# Patient Record
Sex: Female | Born: 1955 | Race: White | Hispanic: No | Marital: Married | Smoking: Former smoker
Health system: Southern US, Community
[De-identification: ages and names within clinical notes are randomized; demographics above are authoritative.]

## PROBLEM LIST (undated history)

## (undated) DIAGNOSIS — M810 Age-related osteoporosis without current pathological fracture: Secondary | ICD-10-CM

## (undated) DIAGNOSIS — H8109 Meniere's disease, unspecified ear: Secondary | ICD-10-CM

## (undated) DIAGNOSIS — E039 Hypothyroidism, unspecified: Secondary | ICD-10-CM

## (undated) DIAGNOSIS — G35 Multiple sclerosis: Secondary | ICD-10-CM

## (undated) DIAGNOSIS — N8501 Benign endometrial hyperplasia: Secondary | ICD-10-CM

## (undated) DIAGNOSIS — R609 Edema, unspecified: Secondary | ICD-10-CM

## (undated) DIAGNOSIS — A0472 Enterocolitis due to Clostridium difficile, not specified as recurrent: Secondary | ICD-10-CM

## (undated) HISTORY — DX: Hypothyroidism, unspecified: E03.9

## (undated) HISTORY — PX: PELVIC LAPAROSCOPY: SHX162

## (undated) HISTORY — DX: Edema, unspecified: R60.9

## (undated) HISTORY — DX: Enterocolitis due to Clostridium difficile, not specified as recurrent: A04.72

## (undated) HISTORY — PX: THYROIDECTOMY, PARTIAL: SHX18

## (undated) HISTORY — DX: Multiple sclerosis: G35

## (undated) HISTORY — DX: Age-related osteoporosis without current pathological fracture: M81.0

## (undated) HISTORY — DX: Meniere's disease, unspecified ear: H81.09

## (undated) HISTORY — PX: DILATION AND CURETTAGE OF UTERUS: SHX78

## (undated) HISTORY — DX: Benign endometrial hyperplasia: N85.01

---

## 1998-10-14 ENCOUNTER — Other Ambulatory Visit: Admission: RE | Admit: 1998-10-14 | Discharge: 1998-10-14 | Payer: Self-pay | Admitting: Obstetrics and Gynecology

## 2000-02-25 ENCOUNTER — Other Ambulatory Visit: Admission: RE | Admit: 2000-02-25 | Discharge: 2000-02-25 | Payer: Self-pay | Admitting: Otolaryngology

## 2000-06-23 ENCOUNTER — Other Ambulatory Visit: Admission: RE | Admit: 2000-06-23 | Discharge: 2000-06-23 | Payer: Self-pay | Admitting: Obstetrics and Gynecology

## 2001-07-13 ENCOUNTER — Other Ambulatory Visit: Admission: RE | Admit: 2001-07-13 | Discharge: 2001-07-13 | Payer: Self-pay | Admitting: Gynecology

## 2003-06-13 ENCOUNTER — Other Ambulatory Visit: Admission: RE | Admit: 2003-06-13 | Discharge: 2003-06-13 | Payer: Self-pay | Admitting: Gynecology

## 2003-09-07 DIAGNOSIS — N8501 Benign endometrial hyperplasia: Secondary | ICD-10-CM

## 2003-09-07 HISTORY — PX: HYSTEROSCOPY: SHX211

## 2003-09-07 HISTORY — DX: Benign endometrial hyperplasia: N85.01

## 2003-09-12 ENCOUNTER — Ambulatory Visit (HOSPITAL_BASED_OUTPATIENT_CLINIC_OR_DEPARTMENT_OTHER): Admission: RE | Admit: 2003-09-12 | Discharge: 2003-09-12 | Payer: Self-pay | Admitting: Gynecology

## 2003-09-12 ENCOUNTER — Ambulatory Visit (HOSPITAL_COMMUNITY): Admission: RE | Admit: 2003-09-12 | Discharge: 2003-09-12 | Payer: Self-pay | Admitting: Gynecology

## 2003-09-12 ENCOUNTER — Encounter (INDEPENDENT_AMBULATORY_CARE_PROVIDER_SITE_OTHER): Payer: Self-pay | Admitting: Specialist

## 2004-06-18 ENCOUNTER — Other Ambulatory Visit: Admission: RE | Admit: 2004-06-18 | Discharge: 2004-06-18 | Payer: Self-pay | Admitting: Gynecology

## 2004-12-03 ENCOUNTER — Ambulatory Visit: Payer: Self-pay | Admitting: Internal Medicine

## 2004-12-08 ENCOUNTER — Ambulatory Visit: Payer: Self-pay | Admitting: Internal Medicine

## 2004-12-17 ENCOUNTER — Ambulatory Visit: Payer: Self-pay

## 2006-01-12 ENCOUNTER — Other Ambulatory Visit: Admission: RE | Admit: 2006-01-12 | Discharge: 2006-01-12 | Payer: Self-pay | Admitting: Gynecology

## 2006-02-18 ENCOUNTER — Encounter: Admission: RE | Admit: 2006-02-18 | Discharge: 2006-02-18 | Payer: Self-pay | Admitting: Endocrinology

## 2006-03-02 ENCOUNTER — Encounter: Admission: RE | Admit: 2006-03-02 | Discharge: 2006-03-02 | Payer: Self-pay | Admitting: Endocrinology

## 2006-03-02 ENCOUNTER — Encounter (INDEPENDENT_AMBULATORY_CARE_PROVIDER_SITE_OTHER): Payer: Self-pay | Admitting: *Deleted

## 2006-03-02 ENCOUNTER — Other Ambulatory Visit: Admission: RE | Admit: 2006-03-02 | Discharge: 2006-03-02 | Payer: Self-pay | Admitting: Interventional Radiology

## 2006-03-26 ENCOUNTER — Emergency Department (HOSPITAL_COMMUNITY): Admission: EM | Admit: 2006-03-26 | Discharge: 2006-03-26 | Payer: Self-pay | Admitting: Emergency Medicine

## 2006-06-02 ENCOUNTER — Encounter (INDEPENDENT_AMBULATORY_CARE_PROVIDER_SITE_OTHER): Payer: Self-pay | Admitting: Specialist

## 2006-06-02 ENCOUNTER — Ambulatory Visit (HOSPITAL_COMMUNITY): Admission: RE | Admit: 2006-06-02 | Discharge: 2006-06-03 | Payer: Self-pay | Admitting: Surgery

## 2006-08-10 ENCOUNTER — Ambulatory Visit (HOSPITAL_COMMUNITY): Admission: RE | Admit: 2006-08-10 | Discharge: 2006-08-10 | Payer: Self-pay | Admitting: Gastroenterology

## 2006-08-10 ENCOUNTER — Encounter (INDEPENDENT_AMBULATORY_CARE_PROVIDER_SITE_OTHER): Payer: Self-pay | Admitting: Specialist

## 2007-02-14 ENCOUNTER — Other Ambulatory Visit: Admission: RE | Admit: 2007-02-14 | Discharge: 2007-02-14 | Payer: Self-pay | Admitting: Gynecology

## 2007-09-18 ENCOUNTER — Encounter: Admission: RE | Admit: 2007-09-18 | Discharge: 2007-09-18 | Payer: Self-pay | Admitting: Radiology

## 2008-02-15 ENCOUNTER — Other Ambulatory Visit: Admission: RE | Admit: 2008-02-15 | Discharge: 2008-02-15 | Payer: Self-pay | Admitting: Gynecology

## 2008-02-23 ENCOUNTER — Encounter: Admission: RE | Admit: 2008-02-23 | Discharge: 2008-02-23 | Payer: Self-pay | Admitting: Gynecology

## 2008-03-23 ENCOUNTER — Inpatient Hospital Stay (HOSPITAL_COMMUNITY): Admission: EM | Admit: 2008-03-23 | Discharge: 2008-03-26 | Payer: Self-pay | Admitting: Emergency Medicine

## 2008-05-09 ENCOUNTER — Ambulatory Visit: Payer: Self-pay | Admitting: Internal Medicine

## 2008-05-09 DIAGNOSIS — E559 Vitamin D deficiency, unspecified: Secondary | ICD-10-CM | POA: Insufficient documentation

## 2008-05-09 DIAGNOSIS — E785 Hyperlipidemia, unspecified: Secondary | ICD-10-CM | POA: Diagnosis present

## 2008-05-09 DIAGNOSIS — Z860100 Personal history of colon polyps, unspecified: Secondary | ICD-10-CM | POA: Insufficient documentation

## 2008-05-09 DIAGNOSIS — F411 Generalized anxiety disorder: Secondary | ICD-10-CM | POA: Insufficient documentation

## 2008-05-09 DIAGNOSIS — E039 Hypothyroidism, unspecified: Secondary | ICD-10-CM | POA: Insufficient documentation

## 2008-05-09 DIAGNOSIS — D509 Iron deficiency anemia, unspecified: Secondary | ICD-10-CM | POA: Insufficient documentation

## 2008-05-10 ENCOUNTER — Ambulatory Visit: Payer: Self-pay | Admitting: Internal Medicine

## 2008-05-14 LAB — CONVERTED CEMR LAB
Alkaline Phosphatase: 40 units/L (ref 39–117)
BUN: 12 mg/dL (ref 6–23)
Basophils Absolute: 0 10*3/uL (ref 0.0–0.1)
Bilirubin Urine: NEGATIVE
Bilirubin, Direct: 0.1 mg/dL (ref 0.0–0.3)
CO2: 31 meq/L (ref 19–32)
Cholesterol: 177 mg/dL (ref 0–200)
Creatinine, Ser: 0.8 mg/dL (ref 0.4–1.2)
Folate: 12.8 ng/mL
Glucose, Bld: 109 mg/dL — ABNORMAL HIGH (ref 70–99)
HDL: 50 mg/dL (ref >39.0)
Hemoglobin, Urine: NEGATIVE
Hemoglobin: 14.3 g/dL (ref 12.0–15.0)
Iron: 91 ug/dL (ref 42–145)
LDL Cholesterol: 108 mg/dL — ABNORMAL HIGH (ref 0–99)
Lymphocytes Relative: 37.5 % (ref 12.0–46.0)
MCHC: 34.4 g/dL (ref 30.0–36.0)
Monocytes Relative: 12.6 % — ABNORMAL HIGH (ref 3.0–12.0)
Platelets: 189 10*3/uL (ref 150–400)
Potassium: 3.9 meq/L (ref 3.5–5.1)
Total Bilirubin: 0.8 mg/dL (ref 0.3–1.2)
Total Protein, Urine: NEGATIVE mg/dL
Total Protein: 6.8 g/dL (ref 6.0–8.3)
Transferrin: 234.2 mg/dL (ref >=212.0)
Triglycerides: 94 mg/dL (ref 0–149)
Urobilinogen, UA: 0.2 (ref 0.0–1.0)
VLDL: 19 mg/dL (ref 0–40)
Vitamin B-12: 387 pg/mL (ref 211–911)
pH: 5 (ref 5.0–8.0)

## 2008-06-07 ENCOUNTER — Telehealth (INDEPENDENT_AMBULATORY_CARE_PROVIDER_SITE_OTHER): Payer: Self-pay | Admitting: *Deleted

## 2008-06-07 DIAGNOSIS — N3000 Acute cystitis without hematuria: Secondary | ICD-10-CM | POA: Insufficient documentation

## 2008-06-10 ENCOUNTER — Ambulatory Visit: Payer: Self-pay | Admitting: Internal Medicine

## 2008-06-26 ENCOUNTER — Ambulatory Visit (HOSPITAL_BASED_OUTPATIENT_CLINIC_OR_DEPARTMENT_OTHER): Admission: RE | Admit: 2008-06-26 | Discharge: 2008-06-26 | Payer: Self-pay | Admitting: Orthopedic Surgery

## 2008-08-06 ENCOUNTER — Encounter: Payer: Self-pay | Admitting: Internal Medicine

## 2009-02-17 ENCOUNTER — Other Ambulatory Visit: Admission: RE | Admit: 2009-02-17 | Discharge: 2009-02-17 | Payer: Self-pay | Admitting: Gynecology

## 2009-02-17 ENCOUNTER — Encounter: Payer: Self-pay | Admitting: Gynecology

## 2009-02-17 ENCOUNTER — Ambulatory Visit: Payer: Self-pay | Admitting: Gynecology

## 2009-02-24 ENCOUNTER — Encounter: Admission: RE | Admit: 2009-02-24 | Discharge: 2009-02-24 | Payer: Self-pay | Admitting: Gynecology

## 2009-07-02 ENCOUNTER — Ambulatory Visit (HOSPITAL_BASED_OUTPATIENT_CLINIC_OR_DEPARTMENT_OTHER): Admission: RE | Admit: 2009-07-02 | Discharge: 2009-07-02 | Payer: Self-pay | Admitting: Orthopedic Surgery

## 2009-07-18 ENCOUNTER — Ambulatory Visit: Payer: Self-pay | Admitting: Internal Medicine

## 2009-07-18 LAB — CONVERTED CEMR LAB
AST: 57 units/L — ABNORMAL HIGH (ref 0–37)
Albumin: 3.9 g/dL (ref 3.5–5.2)
Alkaline Phosphatase: 55 units/L (ref 39–117)
BUN: 13 mg/dL (ref 6–23)
Bilirubin Urine: NEGATIVE
CO2: 30 meq/L (ref 19–32)
Chloride: 104 meq/L (ref 96–112)
GFR calc non Af Amer: 79.28 mL/min (ref >60)
Glucose, Bld: 100 mg/dL — ABNORMAL HIGH (ref 70–99)
HCT: 38.4 % (ref 36.0–46.0)
HDL: 64.5 mg/dL (ref >39.00)
Ketones, ur: NEGATIVE mg/dL
Leukocytes, UA: NEGATIVE
Lymphocytes Relative: 31.5 % (ref 12.0–46.0)
MCV: 91.4 fL (ref 78.0–100.0)
Neutro Abs: 2.4 10*3/uL (ref 1.4–7.7)
Platelets: 197 10*3/uL (ref 150.0–400.0)
Potassium: 4.1 meq/L (ref 3.5–5.1)
RBC: 4.2 M/uL (ref 3.87–5.11)
Specific Gravity, Urine: 1.02 (ref 1.000–1.030)
TSH: 2.97 microintl units/mL (ref 0.35–5.50)
Total Protein, Urine: NEGATIVE mg/dL
VLDL: 20.6 mg/dL (ref 0.0–40.0)
WBC: 4.4 10*3/uL — ABNORMAL LOW (ref 4.5–10.5)

## 2009-07-22 ENCOUNTER — Ambulatory Visit: Payer: Self-pay | Admitting: Internal Medicine

## 2009-09-06 HISTORY — PX: KNEE SURGERY: SHX244

## 2010-01-12 ENCOUNTER — Ambulatory Visit: Payer: Self-pay | Admitting: Internal Medicine

## 2010-01-12 DIAGNOSIS — T887XXA Unspecified adverse effect of drug or medicament, initial encounter: Secondary | ICD-10-CM | POA: Insufficient documentation

## 2010-01-12 DIAGNOSIS — J309 Allergic rhinitis, unspecified: Secondary | ICD-10-CM | POA: Insufficient documentation

## 2010-01-12 DIAGNOSIS — J45909 Unspecified asthma, uncomplicated: Secondary | ICD-10-CM | POA: Insufficient documentation

## 2010-01-13 LAB — CONVERTED CEMR LAB
AST: 29 units/L (ref 0–37)
BUN: 14 mg/dL (ref 6–23)
Basophils Absolute: 0 10*3/uL (ref 0.0–0.1)
Basophils Relative: 0.4 % (ref 0.0–3.0)
Bilirubin, Direct: 0.1 mg/dL (ref 0.0–0.3)
Chloride: 101 meq/L (ref 96–112)
Direct LDL: 117.5 mg/dL
Eosinophils Absolute: 0.1 10*3/uL (ref 0.0–0.7)
HDL: 72.6 mg/dL (ref >39.00)
Lymphs Abs: 1.4 10*3/uL (ref 0.7–4.0)
MCHC: 34.2 g/dL (ref 30.0–36.0)
Monocytes Absolute: 0.6 10*3/uL (ref 0.1–1.0)
Monocytes Relative: 12.2 % — ABNORMAL HIGH (ref 3.0–12.0)
Neutro Abs: 2.4 10*3/uL (ref 1.4–7.7)
Neutrophils Relative %: 53.2 % (ref 43.0–77.0)
Platelets: 216 10*3/uL (ref 150.0–400.0)
Potassium: 4.4 meq/L (ref 3.5–5.1)
Total CHOL/HDL Ratio: 3
Triglycerides: 145 mg/dL (ref 0.0–149.0)
WBC: 4.5 10*3/uL (ref 4.5–10.5)

## 2010-01-30 ENCOUNTER — Ambulatory Visit: Payer: Self-pay | Admitting: Gynecology

## 2010-02-18 ENCOUNTER — Ambulatory Visit: Payer: Self-pay | Admitting: Gynecology

## 2010-02-18 ENCOUNTER — Other Ambulatory Visit: Admission: RE | Admit: 2010-02-18 | Discharge: 2010-02-18 | Payer: Self-pay | Admitting: Gynecology

## 2010-02-25 ENCOUNTER — Encounter: Admission: RE | Admit: 2010-02-25 | Discharge: 2010-02-25 | Payer: Self-pay | Admitting: Gynecology

## 2010-03-02 ENCOUNTER — Ambulatory Visit: Payer: Self-pay | Admitting: Gynecology

## 2010-03-05 ENCOUNTER — Ambulatory Visit: Payer: Self-pay | Admitting: Gynecology

## 2010-04-27 ENCOUNTER — Ambulatory Visit: Payer: Self-pay | Admitting: Gynecology

## 2010-08-13 ENCOUNTER — Encounter: Payer: Self-pay | Admitting: Internal Medicine

## 2010-10-04 LAB — CONVERTED CEMR LAB: Pap Smear: NORMAL

## 2010-10-06 NOTE — Assessment & Plan Note (Signed)
Summary: LINGERING COUGH,COLD/CD   Vital Signs:  Patient profile:   55 year old female Height:      65.5 inches Weight:      125.50 pounds BMI:     20.64 O2 Sat:      98 % on Room air Temp:     98.2 degrees F oral Pulse rate:   79 / minute BP sitting:   110 / 70  (left arm) Cuff size:   regular  Vitals Entered ByZella Ball Ewing (Jan 12, 2010 2:08 PM)  O2 Flow:  Room air  CC: Cough, congestion/RE   CC:  Cough and congestion/RE.  History of Present Illness: here with 2 wks flare of sinus and nasal allergy type congestion with mild ST without pain, fever or feeling ill;  has some mild general weakness but no malaise;  no high fever or chills;  Pt denies CP, orthopnea, pnd, worsening LE edema, palps, dizziness or syncope , but has sob/doe/wheezing for last 2  wks obviously worse with going outside and opening the house windows;  has had marked coughing nonprod that wakes her up at night;  last nighy broke down and tried her cats albuterol inhaler that allowed her to sleep for 3 hrs straight wihtout awakening to the cough.  also requests labs done and forwarded to her other MD's related to interferon;  also mentions has really been trying to follow lower chol diet.  Denies specific hyper or hypothyroid symptoms  Preventive Screening-Counseling & Management      Drug Use:  no.    Problems Prior to Update: 1)  Adverse Drug Reaction  (ICD-995.20) 2)  Hepatotoxicity, Drug-induced, Risk of  (ICD-V58.69) 3)  Asthma  (ICD-493.90) 4)  Allergic Rhinitis  (ICD-477.9) 5)  Acute Cystitis  (ICD-595.0) 6)  Anxiety  (ICD-300.00) 7)  Anemia-iron Deficiency  (ICD-280.9) 8)  Vitamin D Deficiency  (ICD-268.9) 9)  Colonic Polyps, Hx of  (ICD-V12.72) 10)  Hypothyroidism  (ICD-244.9) 11)  Preventive Health Care  (ICD-V70.0) 12)  Hyperlipidemia  (ICD-272.4)  Medications Prior to Update: 1)  Azithromycin 250 Mg Tabs (Azithromycin) .... 2po Qd For 1 Day, Then 1po Qd For 4days, Then Stop 2)  Inferon  Beta 1a 3)  Synthroid 100 Mcg Tabs (Levothyroxine Sodium) .Marland Kitchen.. 1 By Mouth Once Daily 4)  Methocarbamol 500 Mg Tabs (Methocarbamol) .... Use Asd As Needed 5)  Oxycodone-Acetaminophen 5-325 Mg Tabs (Oxycodone-Acetaminophen) .... Use Asd As Needed Pain  Current Medications (verified): 1)  Inferon Beta 1a 2)  Synthroid 100 Mcg Tabs (Levothyroxine Sodium) .Marland Kitchen.. 1 By Mouth Once Daily 3)  Proair Hfa 108 (90 Base) Mcg/act Aers (Albuterol Sulfate) .... 2 Puffs Qid As Needed 4)  Symbicort 160-4.5 Mcg/act Aero (Budesonide-Formoterol Fumarate) .... 2 Puffs Twice Per Day 5)  Singulair 10 Mg Tabs (Montelukast Sodium) .Marland Kitchen.. 1po Once Daily 6)  Fexofenadine Hcl 180 Mg Tabs (Fexofenadine Hcl) .Marland Kitchen.. 1po Once Daily Once Daily (Generic For Allegra)  Allergies (verified): No Known Drug Allergies  Past History:  Past Surgical History: Last updated: 07/22/2009 s/p partial thyroidectomy s/p right breast biopsy - neg s/p laparoscopy for infertility s/p left knee arthroscopy  oct 2010 s/po right knee arthroscopy  oct 2009  Social History: Last updated: 01/12/2010 work - Education administrator Married/previously divorced no children Former Smoker Alcohol use-yes Drug use-no  Risk Factors: Smoking Status: quit (05/09/2008)  Past Medical History: Hyperlipidemia Hypothyroidism Colonic polyps, hx of Multilple sclerosis low vit d Anemia-iron deficiency hx of hyperthyroid nodule s/p patrial thyroidectomy hx of symptomatic  PVC's Anxiety ? hx of ETOH abuse Allergic rhinitis Asthma  Social History: Reviewed history from 05/09/2008 and no changes required. 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   Head:  normocephalic and atraumatic.   Eyes:  vision grossly intact, pupils equal, and pupils round.   Ears:  bilat tm's mild  erythema, sinus nontender Nose:  nasal dischargemucosal pallor and mucosal edema.   Mouth:  pharyngeal erythema and fair dentition.   Neck:  supple and no masses.   Lungs:  normal respiratory effort, R decreased breath sounds, and L decreased breath sounds.  with trace wheezing Heart:  normal rate and regular rhythm.   Abdomen:  soft, non-tender, and normal bowel sounds.   Extremities:  no edema, no erythema  Skin:  color normal and no rashes.     Impression & Recommendations:  Problem # 1:  ALLERGIC RHINITIS (ICD-477.9)  Her updated medication list for this problem includes:    Fexofenadine Hcl 180 Mg Tabs (Fexofenadine hcl) .Marland Kitchen... 1po once daily once daily (generic for allegra)  Orders: Depo- Medrol 40mg  (J1030) Depo- Medrol 80mg  (J1040) Admin of Therapeutic Inj  intramuscular or subcutaneous (16109) for depo shot, and treat as above, f/u any worsening signs or symptoms   Problem # 2:  ASTHMA (ICD-493.90)  Her updated medication list for this problem includes:    Proair Hfa 108 (90 Base) Mcg/act Aers (Albuterol sulfate) .Marland Kitchen... 2 puffs qid as needed    Symbicort 160-4.5 Mcg/act Aero (Budesonide-formoterol fumarate) .Marland Kitchen... 2 puffs twice per day    Singulair 10 Mg Tabs (Montelukast sodium) .Marland Kitchen... 1po once daily treat as above, f/u any worsening signs or symptoms , d/w pt pathyphysiology and goals of tx  Problem # 3:  ANEMIA-IRON DEFICIENCY (ICD-280.9) for f/u cbc  Problem # 4:  VITAMIN D DEFICIENCY (ICD-268.9) declines f/u vit d - to cont vit d supp (take calcium/vit d supp)  Problem # 5:  HYPERLIPIDEMIA (ICD-272.4)  Orders: TLB-Lipid Panel (80061-LIPID)  Labs Reviewed: SGOT: 57 (07/18/2009)   SGPT: 57 (07/18/2009)   HDL:64.50 (07/18/2009), 50.0 (05/10/2008)  LDL:108 (05/10/2008)  Chol:251 (07/18/2009), 177 (05/10/2008)  Trig:103.0 (07/18/2009), 94 (05/10/2008) d/w pt - Pt to continue diet efforts, ; to check labs - goal LDL less than 100  Problem # 6:  HYPOTHYROIDISM  (ICD-244.9)  Her updated medication list for this problem includes:    Synthroid 100 Mcg Tabs (Levothyroxine sodium) .Marland Kitchen... 1 by mouth once daily  Orders: TLB-TSH (Thyroid Stimulating Hormone) (84443-TSH)  Labs Reviewed: TSH: 2.97 (07/18/2009)    Chol: 251 (07/18/2009)   HDL: 64.50 (07/18/2009)   LDL: 108 (05/10/2008)   TG: 103.0 (07/18/2009) stable overall by hx and exam, ok to continue meds/tx as is   Complete Medication List: 1)  Inferon Beta 1a  2)  Synthroid 100 Mcg Tabs (Levothyroxine sodium) .Marland Kitchen.. 1 by mouth once daily 3)  Proair Hfa 108 (90 Base) Mcg/act Aers (Albuterol sulfate) .... 2 puffs qid as needed 4)  Symbicort 160-4.5 Mcg/act Aero (Budesonide-formoterol fumarate) .... 2 puffs twice per day 5)  Singulair 10 Mg Tabs (Montelukast sodium) .Marland Kitchen.. 1po once daily 6)  Fexofenadine Hcl 180 Mg Tabs (Fexofenadine hcl) .Marland Kitchen.. 1po once daily once daily (generic for allegra)  Other Orders: TLB-Hepatic/Liver Function Pnl (80076-HEPATIC) TLB-BMP (Basic Metabolic Panel-BMET) (80048-METABOL) TLB-CBC Platelet -  w/Differential (85025-CBCD)  Patient Instructions: 1)  you had the steroid shot today 2)  Please take all new medications as prescribed  - the proair hfa at 2 puffs 4 times per day as needed only, symbicort (2 puffs twice per day), and singulair 10 mg per day (the proair and symbicort samples are given today) 3)  you can take the allegra 180 mg generic if you are not able to get the singulair 4)  Remember to rinse your mouth with water after each symbicort to avoid thrush 5)  Please go to the Lab in the basement for your blood and/or urine tests today 6)  we will fax the results later to your other doctors 7)  Please schedule a follow-up appointment in Nov 2011 with [facility] labs 8)  Remember, you may be able to stop or hold on taking the symbicort on or about july 1 if the asthma is seasonal for the spring and fall Prescriptions: FEXOFENADINE HCL 180 MG TABS (FEXOFENADINE HCL) 1po once  daily once daily (generic for allegra)  #30 x 11   Entered and Authorized by:   Corwin Levins MD   Signed by:   Corwin Levins MD on 01/12/2010   Method used:   Print then Give to Patient   RxID:   [id number] SINGULAIR 10 MG TABS (MONTELUKAST SODIUM) 1po once daily  #30 x 11   Entered and Authorized by:   Corwin Levins MD   Signed by:   Corwin Levins MD on 01/12/2010   Method used:   Print then Give to Patient   RxID:   [id number] SYMBICORT 160-4.5 MCG/ACT AERO (BUDESONIDE-FORMOTEROL FUMARATE) 2 puffs twice per day  #1 x 11   Entered and Authorized by:   Corwin Levins MD   Signed by:   Corwin Levins MD on 01/12/2010   Method used:   Print then Give to Patient   RxID:   [id number] PROAIR HFA 108 (90 BASE) MCG/ACT AERS (ALBUTEROL SULFATE) 2 puffs qid as needed  #1 x 11   Entered and Authorized by:   Corwin Levins MD   Signed by:   Corwin Levins MD on 01/12/2010   Method used:   Print then Give to Patient   RxID:   [id number]    Medication Administration  Injection # 1:    Medication: Depo- Medrol 40mg     Diagnosis: ALLERGIC RHINITIS (ICD-477.9)    Route: IM    Site: RUOQ gluteus    Exp Date: 07/2012    Lot #: [id number]    Mfr: Pharmacia    Given by: Zella Ball Ewing (Jan 12, 2010 3:34 PM)  Injection # 2:    Medication: Depo- Medrol 80mg     Diagnosis: ALLERGIC RHINITIS (ICD-477.9)    Route: IM    Site: RUOQ gluteus    Exp Date: 07/2012    Lot #: [id number]    Mfr: [employer]    Given by: Zella Ball Ewing (Jan 12, 2010 3:34 PM)  Orders Added: 1)  TLB-Lipid Panel [80061-LIPID] 2)  TLB-TSH (Thyroid Stimulating Hormone) [84443-TSH] 3)  TLB-Hepatic/Liver Function Pnl [80076-HEPATIC] 4)  TLB-BMP (Basic Metabolic Panel-BMET) [80048-METABOL] 5)  TLB-CBC Platelet - w/Differential [85025-CBCD] 6)  Depo- Medrol 40mg  [J1030] 7)  Depo- Medrol 80mg  [J1040] 8)  Admin of Therapeutic Inj  intramuscular or subcutaneous [96372] 9)  Est. Patient Level IV [22025]

## 2010-12-10 LAB — POCT HEMOGLOBIN-HEMACUE: Hemoglobin: 13.3 g/dL (ref 12.0–15.0)

## 2011-01-13 ENCOUNTER — Other Ambulatory Visit: Payer: Self-pay | Admitting: Dermatology

## 2011-01-19 NOTE — H&P (Signed)
NAMESHAQUALA, Ana Russell                 ACCOUNT NO.:  [id number]   MEDICAL RECORD NO.:  [id number]          PATIENT TYPE:  EMS   LOCATION:  ED                           FACILITY:  [facility]   PHYSICIAN:  Levert Feinstein, MD          DATE OF BIRTH:  1955-10-06   DATE OF ADMISSION:  03/23/2008  DATE OF DISCHARGE:                              HISTORY & PHYSICAL   CHIEF COMPLAINT:  Multiple sclerosis.   HISTORY OF PRESENT ILLNESS:  The patient is a 55 year old right-handed  Caucasian female who is accompanied by her husband at today's ER visit.  She is a right-handed Education administrator.  She reported a diagnosis of multiple  sclerosis 10 years ago, presenting with diplopia at that time.  Workup  was done at [facility].  Reported had an MRI of the brain  and also cervical, which has confirmed the diagnosis but no treatment  was offered.   Her diplopia lasted about 3 months initially.  Later improved with  prescription glasses but she is no longer wearing them in the past 10  years.  Never seeking further neurological care in the past 10 years.  There was were no recurrent neurological attacks, but she did report  that her bilateral feet stay numb.  She has difficulty initiating urine  but at baseline denied gait difficulty, dysarthria, dysphagia, enjoying  painting at home.   Sunday, 6 days prior to admission, she had slight slurred speech.  On  Monday, the next day, while driving, she noticed double vision.  She  took out her old pair of glasses and put a patch on it for her to drive.  However, her dysarthria, diplopia has worsened over the past few days'  course.  In addition, she has gradually developed unsteady gait and her  husband also reported she seems to be confused.   REVIEW OF SYSTEMS:  She complains of knee pain.  There was no chest  pain.  No headaches.  There was no dysuria.   PAST MEDICAL HISTORY:  Also has restless leg syndrome, hypothyroidism  following thyroidectomy a year and  a half ago.   PAST SURGICAL HISTORY:  Thyroidectomy.  Pathology reported to be  consistent with thyroiditis.   FAMILY HISTORY:  Sister suffers a thyroid tumor.  Mother also has  hypothyroidism.  Knee problems run in the family.   SOCIAL HISTORY:  Moved to [place] about 10 years ago.  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  She smokes 5-6 cigarettes every day and drinks a couple  of wines with dinner every day.   CURRENT MEDICATIONS:  Synthroid 75 mcg every day.   DRUG ALLERGIES:  No known drug allergies.   PHYSICAL EXAMINATION:  She is afebrile.  CARDIAC:  Regular rate and rhythm.  PULMONARY:  Clear to auscultation bilaterally.  NECK:  Supple.  No carotid bruits.  NEUROLOGICAL EXAMINATION:  She is alert and oriented to history taking  and conversation.  There was profound dysarthria.  Cranial nerves 2-12:  Pupils equal, round, reactive to light, and visual fields were full on  confrontation.  Fundi were sharp bilaterally.  There was end-gaze mild  horizontal nystagmus, bilateral exotropia.  Facial sensation and  strength were normal.  Uvula and tongue midline.  Has slow, ataxic  tongue movements.  Head turning, shoulder shrugging were normal and  symmetric.  MOTOR EXAMINATION:  Normal pulling back and strength.  Sensory was  intact to light touch, pinprick, vibratory sensation.  Deep tendon  reflexes were diffusely hyperreflexic, 3 out of 4, symmetric.  Jaw jerk  was present but not hyper.  Plantar responses were extensor bilaterally.  Coordination:  She has mild bilateral finger-to-nose dysmetria,  worsening on the right side, also with alternating rapid movement.  She  has moderate left lower extremity heel to shin dysmetria.  Gait:  She  walked with a wide-based, unsteady gait.   MRI of the brain with and without contrast revealed there are multiple  periventricular white matter disease, oval shaped.  Perpendicular  to the  ventricle there was also T1 holes.  There were large sites of right  midbrain positive DWI lesions.  Motion degraded the imaging.  I do not  see any contrast enhancement.   MRA of the brain and neck was normal.   LAB EVALUATION:  UA demonstrated a few bacteria with WBC.   ASSESSMENT AND PLAN:  A 55 year old female with at least a 10-year  history of multiple sclerosis, history mostly suggestive of relapsing  remitting course.  Discussed with the patient and her husband, planning  on the following:  1. Steroids and Solu-Medrol 500 mg IV b.i.d. to hasten the recovery.  2. Cipro 250 b.i.d. for urinary tract infection.  3. She will definitely benefit from long-term interferon treatment.      Options provided.  We decided on Rebif.  I checked with the      pharmacy.  No Rebif is in stock, so we will initiate the medication      as an outpatient.  She will receive subcutaneous injection training      here.  4. Complete workup including MRI of the cervical spine and thorax with      and without contrast.  5. Physical therapy and occupational therapy and speech therapy.      Levert Feinstein, MD  Electronically Signed     YY/MEDQ  D:  03/23/2008  T:  03/23/2008  Job:  [id number]

## 2011-01-19 NOTE — Op Note (Signed)
NAMECHARNISE, Ana Russell                 ACCOUNT NO.:  [id number]   MEDICAL RECORD NO.:  [id number]          PATIENT TYPE:  AMB   LOCATION:  [facility]                         FACILITY:  [facility]   PHYSICIAN:  Ollen Gross, M.D.    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   PROCEDURE:  Right knee arthroscopy with chondroplasty patella and  lateral retinacular release.   SURGEON:  Ollen Gross, M.D.   ASSISTANT:  No assistant.   ANESTHESIA:  General.   ESTIMATED BLOOD LOSS:  Minimal.   DRAINS:  Hemovac times one.   COMPLICATIONS:  None.   CONDITION:  Stable to recovery. Marland Kitchen   CLINICAL NOTE:  Ana Russell is a 54 year old female with longstanding history  of problems related to her right knee.  She has a family history of  maltracking patella and had a sister with the same problem who underwent  lateral retinacular release.  Ana Russell has had a significant anterior knee  pain refractory to nonoperative measures.  She has lateral tilt and exam  and history consistent with chondromalacia patella.  She presents now  for arthroscopy with chondroplasty and lateral retinacular release.   PROCEDURE IN DETAIL:  After successful administration of general  anesthetic, a tourniquet was placed high on the right thigh and right  lower extremity prepped and draped in the usual sterile fashion.  Standard superomedial and inferolateral incisions were made, inflow  cannula passed superomedial, camera passed inferolateral.  Arthroscopic  visualization proceeds.  The majority of the undersurface of patella  looks normal except the lateral facet, which has grade II and III  chondromalacia.  There was no exposed bone.  The lateral trochlea  surprisingly did not show much  chondromalacia at all.  She has very mild  changes laterally but no unstable appearing cartilage.  Medial and  lateral gutters were visualized and there were no loose bodies.  Flexion  and valgus force applied to the knee and the medial compartment is  entered.  The medial compartment looks normal.  A spinal needle was used  to localize the inferomedial portal, small incision made, dilator  placed.  The intercondylar notch was visualized.  The ACL appears  normal.  Lateral compartment was entered and it is normal.  I then  addressed the patellofemoral compartment.  We used a 4.2 mm shaver to  smooth the roughened surface of the lateral facet the cartilage.  Cartilage was unstable and was debrided back to a stable cartilaginous  base with the shaver.  We then turned off the inflow and switched  portals with the working portal inferolateral and camera inferomedial  port.  I completed the chondroplasty through the inferolateral portal.  Once the cartilage was smoothed off then we marked the junction of the  superior and lateral parts of the patella with a spinal needle and that  was a starting  point for the lateral retinacular release.  The  arthroscopic cautery is placed in the inferolateral portal and released  start at this point and coursing all the way down to the portal.  Once  the release was completed, then I decreased the inflow pressure and  cauterized small bleeding points.  I felt we had excellent hemostasis.  The arthroscopic equipment was then removed from the inferior portals  which were closed with interrupted 4-0 nylon.  20 mL of 0.25% Marcaine  with epi were injected through the inflow cannula and the Hemovac drain  was threaded through the cannula and cannula subsequently removed.  The  portal site was closed but the drain is not sewn in.  Drain is hooked to  suction a bulky sterile dressing is applied and she is awakened and  transferred to recovery in stable  condition.      Ollen Gross, M.D.  Electronically Signed     FA/MEDQ  D:  06/26/2008  T:  06/26/2008  Job:  [id number]

## 2011-01-19 NOTE — Discharge Summary (Signed)
NAMEPANHIA, KARL                 ACCOUNT NO.:  [id number]   MEDICAL RECORD NO.:  [id number]          PATIENT TYPE:  INP   LOCATION:  1523                         FACILITY:  [facility]   PHYSICIAN:  Levert Feinstein, MD          DATE OF BIRTH:  06/04/1956   DATE OF ADMISSION:  03/23/2008  DATE OF DISCHARGE:  03/25/2008                               DISCHARGE SUMMARY   ADMITTING DIAGNOSIS:  Multiple sclerosis.   DISCHARGE DIAGNOSIS:  Multiple sclerosis.   HOSPITAL STAY:  The patient is a 55 year old right-handed Caucasian  female who was admitted secondary to having multiple sclerosis and  having difficulty with walking, balance, and dysarthria.  The patient  initially had diplopia that lasted 3 months, but later improved with  prescription glasses.  However, Sunday, 6 days prior to admission, she  had slight slurring of speech.  Next day while driving, she noticed  blurred and double vision.  She took out her old pair of glasses, put on  a patch to allow her to drive.  However, her dysarthria and diplopia has  worsened over the past few days.  While in the hospital, the patient  received 500 mg methylprednisolone IV b.i.d., which improved her  symptoms of diplopia.  However, her dysarthria remains unchanged.   PHYSICAL EXAM:  At the time of discharge, the patient was alert and  oriented, conversant.  CRANIAL NERVES 2-12:  Pupils equal, round, and reactive to light.  Visual fields were full to confrontation.  Fundi were sharp bilaterally.  Facial sensation was within normal limits.  Uvula and tongue midline.  She did still have slow ataxic tongue movements.  Head turning and  shoulder shrug were all normal.  Motor examination normal.  Strength 5/5  in all extremities.  Deep tendon reflexes were hyperreflexic 3/4 and  symmetrical.  Plantar responses still up bilaterally.  Coordination had  improved.  Her finger to nose only had slight dysmetria on her right  side, and at the very end of the  motion.  No lower extremity dysmetria  with heel to shin.   MRI at discharge had not changed.  MRI with and without contrast  revealed that there are multiple periventricular white matter disease,  oval in shape, perpendicular to the ventricular.  There also were T1  holes.  There were large sites of right midbrain positive DWI lesions.  MRA of the brain and neck were all normal.  Hospital labs were all  within normal limits.   COURSE:  As stated, the patient was admitted and received 500 mg of  methylprednisolone b.i.d., and she will continue this as an outpatient  procedure.   DISPOSITION:  The patient will be discharged home with home therapy for  PT/OT and speech therapy.  She will also be discharged home with a  walker for fall precautions, and for home health to follow up with the  patient.   MEDICATIONS ON DISCHARGE:  1. The patient was discharged home on methylprednisolone 500 mg IV      b.i.d.  for total of 5 days.  Clinical coordinator will be working      with her on this.  2. She was also discharged with a prescription for Cipro 250 mg to be      taken b.i.d. for a urinary tract infection.  She will be given one      week worth of medication.  3. She was also given a prescription for Protonix 40 mg to be taken      b.i.d  4. In addition, she was given a prescription for physical therapy,      occupational therapy, and speech therapy to work with her on a      daily basis.   FAMILY HISTORY:  The patient will be following up with Dr. Terrace Arabia within 1  to 2 weeks.  At that time, will most likely start rebif therapy.      Levert Feinstein, MD  Electronically Signed     YY/MEDQ  D:  03/25/2008  T:  03/25/2008  Job:  [id number]

## 2011-01-22 NOTE — H&P (Signed)
NAME:  Ana Russell, Ana Russell                         ACCOUNT NO.:  [id number]   MEDICAL RECORD NO.:  [id number]                   PATIENT TYPE:  AMB   LOCATION:  [facility]                                 FACILITY:  [facility]   PHYSICIAN:  Timothy P. Fontaine, M.D.           DATE OF BIRTH:  1955-11-02   DATE OF ADMISSION:  09/11/2002  DATE OF DISCHARGE:                                HISTORY & PHYSICAL   CHIEF COMPLAINT:  Menorrhagia.   HISTORY OF PRESENT ILLNESS:  A 55 year old G37, P0, AB2 female history of  regular menses who notes over the past year her periods have gotten heavier  and heavier.  She underwent an outpatient evaluation to include ultrasound  which showed multiple myomas and had a questionable endometrial polyp on  routine ultrasound.  She had a follow-up sonohysterogram which confirmed  intracavitary abnormalities consistent with either polyps or small myomas  and she is admitted at this time for hysteroscopic evaluation.   PAST MEDICAL HISTORY:  None.   PAST SURGICAL HISTORY:  TAB x2.   ALLERGIES:  None.   REVIEW OF SYSTEMS:  Noncontributory.   CURRENT MEDICATIONS:  None.   FAMILY HISTORY:  Noncontributory.   PHYSICAL EXAMINATION:  VITAL SIGNS:  Afebrile.  Vital signs are stable.  HEENT:  Normal.  LUNGS:  Clear.  CARDIAC:  Regular rate without murmurs, rubs, or gallops.  ABDOMEN:  Benign.  PELVIC:  External BUS, vagina normal.  Uterus:  Mild irregularity.  Adnexa  without masses or tenderness.   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  I reviewed with the patient as far as her history  of menorrhagia and her myomas that there are no guarantees as far as that  hysteroscopy and removal of the intracavitary abnormalities will relieve her  menorrhagia.  She also clearly understands that we are not addressing the  myomas that are in the wall  of the uterus, but only the pedunculated or  significantly intracavitary abnormalities.  I discussed what is involved  with hysteroscopic myomectomy to include instrumentation, use of the  hysteroscope and the risks to include bleeding, transfusion, infection,  uterine perforation, damage to internal organs including bowel, bladder,  ureters, vessels, and nerves necessitating major exploratory reparative  surgeries and future reparative  surgeries including ostomy formation.  The patient's questions are answered  to her satisfaction and she is ready to proceed with surgery.  She has a CBC  and a serum pregnancy test ordered as a preoperative, the results of which  are pending at this time.                                               Timothy P. Fontaine, M.D.  TPF/MEDQ  D:  09/09/2003  T:  09/09/2003  Job:  [id number]

## 2011-01-22 NOTE — Op Note (Signed)
Ana Russell, Ana Russell                 ACCOUNT NO.:  [id number]   MEDICAL RECORD NO.:  [id number]          PATIENT TYPE:  OIB   LOCATION:  1607                         FACILITY:  [facility]   PHYSICIAN:  Velora Heckler, MD      DATE OF BIRTH:  1956-07-10   DATE OF PROCEDURE:  06/02/2006  DATE OF DISCHARGE:  06/03/2006                                 OPERATIVE REPORT   PREOPERATIVE DIAGNOSIS:  Left thyroid nodule with cytologic atypia.   POSTOPERATIVE DIAGNOSIS:  Left thyroid nodule with cytologic atypia.   PROCEDURE:  Left thyroid lobectomy.   SURGEON:  Velora Heckler, MD, FACS   ASSISTANT:  Harriette Bouillon, MD, FACS   ANESTHESIA:  General.   ESTIMATED BLOOD LOSS:  Minimal.   PREPARATION:  Betadine.   COMPLICATIONS:  None.   INDICATIONS:  The patient is a 55 year old white female from [place],  [place].  The patient had been found on routine laboratory studies to  have hypothyroidism.  She was referred to Dr. Dorisann Frames.  Workup  included thyroid ultrasound, which showed a 2.8-cm nodule in the left  thyroid lobe.  Fine-needle aspiration was performed and showed cytologic  atypia with intranuclear grooves and scant colloid.  The patient was  referred for excision for definitive diagnosis.  The patient now comes to  the operating room for a left thyroid lobectomy.   BODY OF REPORT:  Procedure is done in OR #6 at the [facility].  The patient is brought to the operating room and placed in supine  position on the operating room table.  Following the administration of  general anesthesia, the patient is positioned and then prepped and draped in  the usual strict aseptic fashion.  After ascertaining that an adequate level  of anesthesia had been obtained, a Kocher incision was made a #15 blade.  Dissection was carried through subcutaneous tissues and platysma.  Skin  flaps are developed cephalad and caudad from the thyroid notch to the  sternal notch.  A  Mahorner self-retaining retractor is placed for exposure.  Strap muscles are incised in the midline and the dissection is begun on the  left side of the neck.  Strap muscles are reflected laterally and the left  thyroid lobe is exposed.  Venous tributaries are divided between small  Ligaclips.  Superior pole is dissected out, and vessels are ligated in  continuity with 2-0 silk ties and medium Ligaclips and divided.  Gland is  rolled anteriorly.  Parathyroid tissue is identified on the thyroid capsule.  It is gently dissected out and preserved.  Gland is rolled anteriorly.  Branches of the inferior thyroid artery are divided between small Ligaclips.  The recurrent nerve is identified and preserved.  Ligament of Allyson Sabal is  transected with the electrocautery and the gland is rolled anteriorly up and  onto the anterior surface of the trachea.  The isthmus is mobilized across  the midline and then divided between hemostats and suture-ligated with 3-0  Vicryl suture ligatures.  Specimen is submitted to Pathology, where Dr. Jimmy Picket  did frozen section analysis.  This shows a follicular lesion.  There  is a background of thyroiditis.  There appears to be no evidence of  malignancy on frozen section evaluation.   Right thyroid lobe is firm, prominent, but without dominant nodule.  Likely,  this represents thyroiditis.   The left neck is irrigated with warm saline, which is evacuated.  Surgicel  was placed over the area of the recurrent nerve and parathyroid glands.  Strap muscles are reapproximated in the midline with interrupted 3-0 Vicryl  sutures.  Platysma is closed with interrupted 3-0 Vicryl sutures.  Skin is  closed with a running 4-0 Vicryl subcuticular suture.  Wound is washed and  dried and Benzoin and Steri-Strips are applied.  Sterile dressings are  applied.  The patient is awakened from anesthesia and brought to the  recovery room in stable condition.  The patient tolerated the  procedure  well.      Velora Heckler, MD  Electronically Signed     TMG/MEDQ  D:  06/02/2006  T:  06/04/2006  Job:  [id number]   cc:   Dorisann Frames, M.D.  Fax: [phone]   Timothy P. Fontaine, M.D.  Fax: [phone]   Corwin Levins, MD  [address]  [place]  [place] [address]

## 2011-01-22 NOTE — Op Note (Signed)
Ana Russell, Ana Russell                 ACCOUNT NO.:  [id number]   MEDICAL RECORD NO.:  [id number]          PATIENT TYPE:  AMB   LOCATION:  ENDO                         FACILITY:  [facility]   PHYSICIAN:  Anselmo Rod, M.D.  DATE OF BIRTH:  08-10-1956   DATE OF PROCEDURE:  08/10/2006  DATE OF DISCHARGE:                               OPERATIVE REPORT   PROCEDURE:  Colonoscopy with multiple cold biopsies.   ENDOSCOPIST:  Anselmo Rod, M.D.   INSTRUMENT USED:  Olympus video colonoscope.   INDICATIONS FOR PROCEDURE:  55 year old white female undergoing  screening colonoscopy to rule out colonic polyps, masses, etc.   PREPROCEDURE PREPARATION:  Informed consent was obtained from the  patient.  The patient was fasted for eight hours prior to the procedure  and prepped with a gallon of TriLyte the night prior to the procedure.  The risks and benefits of the procedure including a 10% miss rate of  cancer and polyps were discussed with the patient, as well.   PREPROCEDURE PHYSICAL:  Patient with stable vital signs.  Neck supple.  Chest clear to auscultation.  S1 and S2 regular.  Abdomen soft with  normal bowel sounds.   DESCRIPTION OF PROCEDURE:  The patient was placed in the left lateral  decubitus position, sedated with 140 mcg of fentanyl and 14 mg Versed  given intravenously in slow incremental doses.  Once the patient was  adequately sedated, maintained on low flow oxygen and continuous cardiac  monitoring, the Olympus video colonoscope was advanced from the rectum  to the cecum.  The patient had a very tortuous colon.  The patient's  position had to be changed from the left lateral to the supine and the  right lateral position and gently back to the left lateral and prone  position to reach the cecal base.  On multiple occasions, gentle  abdominal pressure was applied.  The appendiceal orifice and ileocecal  valve were clearly visualized and photographed.  Four small sessile  polyps  were biopsied from the rectosigmoid colon.  Small internal  hemorrhoids were seen on retroflexion.  The rest of the exam was  unremarkable.   IMPRESSION:  1. Very tortuous colon.  2. Four small sessile polyps biopsied from the rectosigmoid colon.  3. Small internal hemorrhoids.  4. Otherwise, normal exam beyond the rectosigmoid colon up to the      cecum.   RECOMMENDATIONS:  1. Await pathology results.  2. Avoid all non-steroidals including aspirin for the next two weeks.  3. Repeat colonoscopy depending on pathology results.  4. Outpatient follow up as need arises in the future.      Anselmo Rod, M.D.  Electronically Signed     JNM/MEDQ  D:  08/10/2006  T:  08/11/2006  Job:  [id number]   cc:   Marcial Pacas P. Fontaine, M.D.  Corwin Levins, MD  Velora Heckler, MD

## 2011-01-22 NOTE — Op Note (Signed)
NAME:  VIRGEN, BELLAND                         ACCOUNT NO.:  [id number]   MEDICAL RECORD NO.:  [id number]                   PATIENT TYPE:  AMB   LOCATION:  [facility]                                 FACILITY:  [facility]   PHYSICIAN:  Timothy P. Fontaine, M.D.           DATE OF BIRTH:  1956/03/01   DATE OF PROCEDURE:  09/12/2003  DATE OF DISCHARGE:                                 OPERATIVE REPORT   PREOPERATIVE DIAGNOSES:  1. Endometrial polyps.  2. Leiomyomata.   POSTOPERATIVE DIAGNOSES:  1. Endometrial polyps.  2. Leiomyomata.   OPERATION/PROCEDURE:  1. Hysteroscopy.  2. Dilatation and curettage.   SURGEON:  Timothy P. Fontaine, M.D.   ANESTHESIA:  General.   ESTIMATED BLOOD LOSS:  Minimal.   COMPLICATIONS:  None.   FLUIDS:  Sorbitol discrepancy less than 50 mL.   FINDINGS:  Multiple small polypoid undulations, posterior right uterine  cavity.  No definitive isolated polyps.  The remainder of the cavity smooth  and normal.  Fundus, anterior and posterior uterine surfaces, lower uterine  segment, endocervical canal, and right and left tubal ostia were all  visualized.   DESCRIPTION OF PROCEDURE:  The patient was taken to the operating room,  underwent general anesthesia, was placed in the low dorsal lithotomy  position, received a perineal vaginal preparation with Betadine solution,  bladder emptied with in-and-out Foley catheterization, EUA performed, and  the patient was then draped in the usual fashion.  The cervix was visualized  with the speculum, anterior lip grasped with a single-tooth tenaculum and  the cervix was gently gradually dilated to admit the operative hysteroscope.  Hysteroscopy was then performed with findings noted above.   Initial curettage was performed directing the curet to the posterior uterine  surface and this was sent as initial specimen.  Subsequent 360-degree cavity  curettage was then performed and this was sent separately.  Rehysteroscopy  was  performed.  The cavity was noted to be empty.  Good distention.  No  evidence of perforation.  The instruments were all removed.  There was  adequate hemostasis visualized.  The patient was placed in the supine  position, awakened without difficulty and taken to the recovery room in good  condition, having tolerated the procedure well.  Sorbitol discrepancy per  machine was 60 mL although sorbitol was noted within the tubing I estimated  less than 50 mL discrepancy.                                               Timothy P. Audie Box, M.D.    TPF/MEDQ  D:  09/12/2003  T:  09/12/2003  Job:  [id number]

## 2011-01-26 ENCOUNTER — Encounter: Payer: Self-pay | Admitting: Gynecology

## 2011-02-12 ENCOUNTER — Other Ambulatory Visit: Payer: Self-pay | Admitting: Gynecology

## 2011-02-12 DIAGNOSIS — Z1231 Encounter for screening mammogram for malignant neoplasm of breast: Secondary | ICD-10-CM

## 2011-02-17 ENCOUNTER — Telehealth: Payer: Self-pay | Admitting: *Deleted

## 2011-02-17 NOTE — Telephone Encounter (Signed)
noted 

## 2011-02-17 NOTE — Telephone Encounter (Signed)
Pt called to make MD aware that she has an appointment with GYN next Monday and that labs would be drawn at that appt. She also has upcoming appt next Thursday with Dr. Jonny Ruiz and will have labs faxed over from GYN's office instead of coming again for labs next Tuesday.

## 2011-02-22 ENCOUNTER — Other Ambulatory Visit: Payer: Self-pay | Admitting: Gynecology

## 2011-02-22 ENCOUNTER — Encounter (INDEPENDENT_AMBULATORY_CARE_PROVIDER_SITE_OTHER): Payer: 59 | Admitting: Gynecology

## 2011-02-22 ENCOUNTER — Other Ambulatory Visit (HOSPITAL_COMMUNITY)
Admission: RE | Admit: 2011-02-22 | Discharge: 2011-02-22 | Disposition: A | Discharge status: Home / Self Care | Payer: 59 | Source: Ambulatory Visit | Attending: Gynecology | Admitting: Gynecology

## 2011-02-22 DIAGNOSIS — M899 Disorder of bone, unspecified: Secondary | ICD-10-CM

## 2011-02-22 DIAGNOSIS — R809 Proteinuria, unspecified: Secondary | ICD-10-CM

## 2011-02-22 DIAGNOSIS — Z01419 Encounter for gynecological examination (general) (routine) without abnormal findings: Secondary | ICD-10-CM

## 2011-02-22 DIAGNOSIS — Z124 Encounter for screening for malignant neoplasm of cervix: Secondary | ICD-10-CM | POA: Insufficient documentation

## 2011-02-22 DIAGNOSIS — M949 Disorder of cartilage, unspecified: Secondary | ICD-10-CM

## 2011-02-23 ENCOUNTER — Other Ambulatory Visit: Payer: Self-pay | Admitting: Internal Medicine

## 2011-02-23 ENCOUNTER — Other Ambulatory Visit: Payer: Self-pay

## 2011-02-23 DIAGNOSIS — Z0000 Encounter for general adult medical examination without abnormal findings: Secondary | ICD-10-CM

## 2011-02-24 ENCOUNTER — Encounter: Payer: Self-pay | Admitting: Internal Medicine

## 2011-02-24 DIAGNOSIS — Z0000 Encounter for general adult medical examination without abnormal findings: Secondary | ICD-10-CM | POA: Insufficient documentation

## 2011-02-25 ENCOUNTER — Other Ambulatory Visit: Payer: Self-pay | Admitting: Internal Medicine

## 2011-02-25 ENCOUNTER — Encounter: Payer: Self-pay | Admitting: Internal Medicine

## 2011-02-25 ENCOUNTER — Ambulatory Visit (INDEPENDENT_AMBULATORY_CARE_PROVIDER_SITE_OTHER): Payer: 59 | Admitting: Internal Medicine

## 2011-02-25 ENCOUNTER — Other Ambulatory Visit (INDEPENDENT_AMBULATORY_CARE_PROVIDER_SITE_OTHER): Payer: 59

## 2011-02-25 VITALS — BP 110/80 | HR 75 | Temp 97.8°F | Ht 65.0 in | Wt 126.5 lb

## 2011-02-25 DIAGNOSIS — Z0000 Encounter for general adult medical examination without abnormal findings: Secondary | ICD-10-CM

## 2011-02-25 DIAGNOSIS — G35 Multiple sclerosis: Secondary | ICD-10-CM

## 2011-02-25 HISTORY — DX: Multiple sclerosis: G35

## 2011-02-25 LAB — TSH: TSH: 0.65 u[IU]/mL (ref 0.35–5.50)

## 2011-02-25 LAB — URINALYSIS, ROUTINE W REFLEX MICROSCOPIC
Bilirubin Urine: NEGATIVE
Hgb urine dipstick: NEGATIVE
Ketones, ur: NEGATIVE
Leukocytes, UA: NEGATIVE
Nitrite: NEGATIVE
Total Protein, Urine: NEGATIVE

## 2011-02-25 LAB — CBC WITH DIFFERENTIAL/PLATELET
Eosinophils Absolute: 0.1 10*3/uL (ref 0.0–0.7)
Lymphs Abs: 0.9 10*3/uL (ref 0.7–4.0)
MCHC: 34.5 g/dL (ref 30.0–36.0)
MCV: 90.3 fl (ref 78.0–100.0)
Monocytes Absolute: 0.6 10*3/uL (ref 0.1–1.0)
Neutrophils Relative %: 52.1 % (ref 43.0–77.0)
Platelets: 160 10*3/uL (ref 150.0–400.0)

## 2011-02-25 LAB — BASIC METABOLIC PANEL
Chloride: 106 mEq/L (ref 96–112)
Potassium: 4.4 mEq/L (ref 3.5–5.1)
Sodium: 140 mEq/L (ref 135–145)

## 2011-02-25 LAB — HEPATIC FUNCTION PANEL
ALT: 26 U/L (ref 0–35)
AST: 29 U/L (ref 0–37)
Alkaline Phosphatase: 56 U/L (ref 39–117)
Bilirubin, Direct: 0.1 mg/dL (ref 0.0–0.3)
Total Bilirubin: 0.4 mg/dL (ref 0.3–1.2)

## 2011-02-25 LAB — LDL CHOLESTEROL, DIRECT: Direct LDL: 121.7 mg/dL

## 2011-02-25 LAB — LIPID PANEL
Total CHOL/HDL Ratio: 3
VLDL: 25 mg/dL (ref 0.0–40.0)

## 2011-02-25 NOTE — Patient Instructions (Signed)
Continue all other medications as before Please go to LAB in the Basement for the blood and/or urine tests to be done today Please call the phone number [phone] (the PhoneTree System) for results of testing in 2-3 days;  When calling, simply dial the number, and when prompted enter the MRN number above (the Medical Record Number) and the # key, then the message should start. We will fax results to Dr Talmage Nap, Dr Terrace Arabia, Dr Audie Box Please return in 1 year for your yearly visit, or sooner if needed, with Lab testing done 3-5 days before

## 2011-02-26 ENCOUNTER — Encounter: Payer: Self-pay | Admitting: Internal Medicine

## 2011-02-26 ENCOUNTER — Other Ambulatory Visit: Payer: Self-pay

## 2011-02-26 MED ORDER — ATORVASTATIN CALCIUM 10 MG PO TABS: 10.0000 mg | ORAL_TABLET | Freq: Every day | ORAL | Status: DC

## 2011-02-26 NOTE — Progress Notes (Signed)
Subjective:    Patient ID: Ana Russell, female    DOB: July 19, 1956, 55 y.o.   MRN: [id number]  HPI Here for wellness and f/u;  Overall doing ok;  Pt denies CP, worsening SOB, DOE, wheezing, orthopnea, PND, worsening LE edema, palpitations, dizziness or syncope.  Pt denies neurological change such as new Headache, facial or extremity weakness.  Pt denies polydipsia, polyuria, or low sugar symptoms. Pt states overall good compliance with treatment and medications, good tolerability, and trying to follow lower cholesterol diet.  Pt denies worsening depressive symptoms, suicidal ideation or panic. No fever, wt loss, night sweats, loss of appetite, or other constitutional symptoms.  Pt states good ability with ADL's, low fall risk, home safety reviewed and adequate, no significant changes in hearing or vision, and occasionally active with exercise. Past Medical History  Diagnosis Date  . Vaginal infection     RECURRENT VAGINAL INFECTIONS  . Endometrial hyperplasia without atypia, simple 09/2003    NORMAL ENDOMETRIAL BIOPSY 03/2005  . Hypothyroidism   . Osteopenia 09/2007    2-3 1/09 VIT D 23  . Multiple sclerosis   . MS (multiple sclerosis) 02/25/2011   Past Surgical History  Procedure Date  . Dilation and curettage of uterus     TAB  . Hysteroscopy 09/2003    POLYPS  . Thyroidectomy, partial   . Knee surgery 2011    bilateral knee scopes    reports that she has quit smoking. She does not have any smokeless tobacco history on file. Her alcohol and drug histories not on file. family history includes Diabetes in her mother and paternal grandfather. No Known Allergies Current Outpatient Prescriptions on File Prior to Visit  Medication Sig Dispense Refill  . interferon beta-1a (REBIF) 44 MCG/0.5ML injection Inject 44 mcg into the skin.        . Levothyroxine Sodium (SYNTHROID PO) Take by mouth.         Review of Systems Review of Systems  Constitutional: Negative for diaphoresis, activity  change, appetite change and unexpected weight change.  HENT: Negative for hearing loss, ear pain, facial swelling, mouth sores and neck stiffness.   Eyes: Negative for pain, redness and visual disturbance.  Respiratory: Negative for shortness of breath and wheezing.   Cardiovascular: Negative for chest pain and palpitations.  Gastrointestinal: Negative for diarrhea, blood in stool, abdominal distention and rectal pain.  Genitourinary: Negative for hematuria, flank pain and decreased urine volume.  Musculoskeletal: Negative for myalgias and joint swelling.  Skin: Negative for color change and wound.  Neurological: Negative for syncope and numbness.  Hematological: Negative for adenopathy.  Psychiatric/Behavioral: Negative for hallucinations, self-injury, decreased concentration and agitation.      Objective:   Physical Exam BP 110/80  Pulse 75  Temp(Src) 97.8 F (36.6 C) (Oral)  Ht 5\' 5"  (1.651 m)  Wt 126 lb 8 oz (57.38 kg)  BMI 21.05 kg/m2  SpO2 97% Physical Exam  VS noted, thin, somewhat frail Constitutional: Pt is oriented to person, place, and time. Appears well-developed and well-nourished.  HENT:  Head: Normocephalic and atraumatic.  Right Ear: External ear normal.  Left Ear: External ear normal.  Nose: Nose normal.  Mouth/Throat: Oropharynx is clear and moist.  Eyes: Conjunctivae and EOM are normal. Pupils are equal, round, and reactive to light.  Neck: Normal range of motion. Neck supple. No JVD present. No tracheal deviation present.  Cardiovascular: Normal rate, regular rhythm, normal heart sounds and intact distal pulses.   Pulmonary/Chest: Effort normal and  breath sounds normal.  Abdominal: Soft. Bowel sounds are normal. There is no tenderness.  Musculoskeletal: Normal range of motion. Exhibits no edema.  Lymphadenopathy:  Has no cervical adenopathy.  Neurological: Pt is alert and oriented to person, place, and time. Pt has normal reflexes. No cranial nerve deficit.    Skin: Skin is warm and dry. No rash noted.  Psychiatric:  Has  normal mood and affect. Behavior is normal. 1+ nervous        Assessment & Plan:

## 2011-02-26 NOTE — Assessment & Plan Note (Signed)

## 2011-03-01 ENCOUNTER — Ambulatory Visit
Admission: RE | Admit: 2011-03-01 | Discharge: 2011-03-01 | Disposition: A | Discharge status: Home / Self Care | Payer: 59 | Source: Ambulatory Visit | Attending: Gynecology | Admitting: Gynecology

## 2011-03-01 ENCOUNTER — Telehealth: Payer: Self-pay

## 2011-03-01 DIAGNOSIS — Z1231 Encounter for screening mammogram for malignant neoplasm of breast: Secondary | ICD-10-CM

## 2011-03-01 NOTE — Telephone Encounter (Signed)
Message copied by Anselm Jungling on Mon Mar 01, 2011  8:21 AM ------      Message from: Corwin Levins      Created: Fri Feb 26, 2011  9:08 PM      Regarding: labs fax       Please fax lab results to Dr Lisabeth Devoid at [facility] medical, Dr Terrace Arabia - neuro, and Dr Audie Box - GYN

## 2011-03-01 NOTE — Telephone Encounter (Signed)
Labs faxed as requested to Dr Terrace Arabia [phone], Dr Lurene Shadow [phone] and Dr Audie Box [phone]

## 2011-03-05 ENCOUNTER — Telehealth: Payer: Self-pay

## 2011-03-05 NOTE — Telephone Encounter (Signed)
Ok to have labs drawn at [facility] lab at [facility]

## 2011-03-05 NOTE — Telephone Encounter (Signed)
Pt called stating she would prefer to have her follow up labs drawn at another facility. Pt says she was severely bruised the last time she had labs drawn at [facility]. Please advise of alternate lab.

## 2011-03-08 ENCOUNTER — Other Ambulatory Visit: Payer: Self-pay

## 2011-03-08 NOTE — Telephone Encounter (Signed)
A user error has taken place: encounter opened in error, closed for administrative reasons.

## 2011-03-09 NOTE — Telephone Encounter (Signed)
Pt advised.

## 2011-03-30 ENCOUNTER — Other Ambulatory Visit: Payer: 59

## 2011-04-07 ENCOUNTER — Telehealth: Payer: Self-pay | Admitting: *Deleted

## 2011-04-07 DIAGNOSIS — N390 Urinary tract infection, site not specified: Secondary | ICD-10-CM

## 2011-04-07 MED ORDER — SULFAMETHOXAZOLE-TRIMETHOPRIM 800-160 MG PO TABS: 1.0000 | ORAL_TABLET | Freq: Two times a day (BID) | ORAL | Status: AC

## 2011-04-07 NOTE — Telephone Encounter (Signed)
PT C/O UTI S/S FREQUENT URINATION, BURNING WITH URINATION, X2 WEEKS. PT STATES SHE TRIED CRANBERRY JUICE TO HELP, BUT NO RELIEF. PT WOULD LIKE RX. (LAST OV 02/22/11) PLEASE ADVISE.

## 2011-04-07 NOTE — Telephone Encounter (Signed)
Septra DS 1 by mouth twice a day x3 days 

## 2011-04-07 NOTE — Telephone Encounter (Signed)
PT INFORMED TO TAKE THE BELOW RX.

## 2011-04-23 ENCOUNTER — Telehealth: Payer: Self-pay | Admitting: *Deleted

## 2011-04-23 DIAGNOSIS — N898 Other specified noninflammatory disorders of vagina: Secondary | ICD-10-CM

## 2011-04-23 MED ORDER — FLUCONAZOLE 150 MG PO TABS: 150.0000 mg | ORAL_TABLET | Freq: Once | ORAL | Status: AC

## 2011-04-23 NOTE — Telephone Encounter (Signed)
We'll cover for yeast with Diflucan 150x1 dose. If symptoms persist issue then she will need office visit.

## 2011-04-23 NOTE — Telephone Encounter (Signed)
PT C/O DISCHARGE NOW. SHE HAS COMPLETED THE RX SEPTRA ON OFFICE VISIT 04/07/11, SHE HAS NO DISCOMFORT ONLY YELLOWISH DISCHARGE. PLEASE ADVISE.

## 2011-04-23 NOTE — Telephone Encounter (Signed)
PT INFORMED WITH THE BELOW NOTE AND TO FOLLOW UP IF SYMPTOMS PERSIST WITH OFFICE VISIT.

## 2011-04-28 ENCOUNTER — Encounter: Payer: Self-pay | Admitting: Gynecology

## 2011-04-28 ENCOUNTER — Ambulatory Visit (INDEPENDENT_AMBULATORY_CARE_PROVIDER_SITE_OTHER): Payer: 59 | Admitting: Gynecology

## 2011-04-28 DIAGNOSIS — N898 Other specified noninflammatory disorders of vagina: Secondary | ICD-10-CM

## 2011-04-28 DIAGNOSIS — B373 Candidiasis of vulva and vagina: Secondary | ICD-10-CM

## 2011-04-28 MED ORDER — FLUCONAZOLE 200 MG PO TABS: 200.0000 mg | ORAL_TABLET | Freq: Every day | ORAL | Status: AC

## 2011-04-28 NOTE — Progress Notes (Signed)
Patient presents complaining of persistent vaginal itching irritation. She recently called with UTI symptoms was treated with Septra DS 1 by mouth twice a day x3 days. She developed some vaginal itching we called in a Diflucan 150 mg and her itching and discharge has persisted.   Exam Pelvic: External BUS vagina with moderate atrophic changes white discharge noted KOH wet prep done bimanual without masses or tenderness  Assessment and plan: KOH wet prep is positive for yeast we'll treat with Diflucan 200 daily x3 days. Patient will call followup if symptoms persist or recur.

## 2011-05-31 ENCOUNTER — Telehealth: Payer: Self-pay | Admitting: *Deleted

## 2011-05-31 DIAGNOSIS — B373 Candidiasis of vulva and vagina: Secondary | ICD-10-CM

## 2011-05-31 MED ORDER — TERCONAZOLE 0.8 % VA CREA: 1.0000 | TOPICAL_CREAM | Freq: Every day | VAGINAL | Status: AC

## 2011-05-31 NOTE — Telephone Encounter (Signed)
Pt called saying yeast still there, itching started again x1 day with light white discharge. Pt took all the diflucan as directed on office visit 04/28/11. She will [place] leaving tomorrow [place]. She tried over the counter monistat,no relief. Pt would like more diflucan to help with this. Please advise

## 2011-05-31 NOTE — Telephone Encounter (Signed)
Terazol 3 day.  I know it is messier but will get yeast species that Diflucan does not get

## 2011-05-31 NOTE — Telephone Encounter (Signed)
Lm on pt voice mail with the below note.

## 2011-06-04 LAB — COMPREHENSIVE METABOLIC PANEL
ALT: 17
ALT: 21
AST: 19
Albumin: 3.5
Alkaline Phosphatase: 42
BUN: 8
CO2: 31
Calcium: 8.9
Chloride: 103
Creatinine, Ser: 0.89
GFR calc Af Amer: >60
GFR calc non Af Amer: >60
Glucose, Bld: 104 — ABNORMAL HIGH
Potassium: 4.6
Sodium: 139
Sodium: 139
Total Bilirubin: 1.2
Total Protein: 5.9 — ABNORMAL LOW
Total Protein: 6.7

## 2011-06-04 LAB — DIFFERENTIAL
Basophils Absolute: 0
Basophils Relative: 0
Eosinophils Absolute: 0.2
Monocytes Absolute: 0.5
Monocytes Relative: 11
Neutro Abs: 2.8
Neutrophils Relative %: 55

## 2011-06-04 LAB — GLUCOSE, CAPILLARY
Glucose-Capillary: 116 — ABNORMAL HIGH
Glucose-Capillary: 143 — ABNORMAL HIGH
Glucose-Capillary: 144 — ABNORMAL HIGH
Glucose-Capillary: 156 — ABNORMAL HIGH
Glucose-Capillary: 169 — ABNORMAL HIGH
Glucose-Capillary: 204 — ABNORMAL HIGH

## 2011-06-04 LAB — URINALYSIS, ROUTINE W REFLEX MICROSCOPIC
Bilirubin Urine: NEGATIVE
Hgb urine dipstick: NEGATIVE
Ketones, ur: NEGATIVE
Nitrite: NEGATIVE
Specific Gravity, Urine: 1.006
pH: 6

## 2011-06-04 LAB — URINE CULTURE: Colony Count: 3000

## 2011-06-04 LAB — CBC
Hemoglobin: 14.4
MCHC: 34
MCV: 91.9
Platelets: 188
RBC: 4.6
RDW: 13.1
RDW: 13.4
WBC: 5.1

## 2011-06-04 LAB — PROTIME-INR: INR: 0.9

## 2011-06-04 LAB — URINE MICROSCOPIC-ADD ON

## 2011-06-04 LAB — APTT: aPTT: 27

## 2011-06-07 LAB — BASIC METABOLIC PANEL
CO2: 28
Calcium: 9
Creatinine, Ser: 0.69
GFR calc Af Amer: >60
GFR calc non Af Amer: >60
Glucose, Bld: 104 — ABNORMAL HIGH

## 2011-06-07 LAB — CBC
MCHC: 33.9
RBC: 4.14
RDW: 12.3

## 2011-06-07 LAB — PROTIME-INR
INR: 1
Prothrombin Time: 12.8

## 2011-08-12 ENCOUNTER — Ambulatory Visit (INDEPENDENT_AMBULATORY_CARE_PROVIDER_SITE_OTHER): Payer: 59 | Admitting: Gynecology

## 2011-08-12 ENCOUNTER — Encounter: Payer: Self-pay | Admitting: Gynecology

## 2011-08-12 DIAGNOSIS — B373 Candidiasis of vulva and vagina: Secondary | ICD-10-CM

## 2011-08-12 DIAGNOSIS — N898 Other specified noninflammatory disorders of vagina: Secondary | ICD-10-CM

## 2011-08-12 MED ORDER — FLUCONAZOLE 200 MG PO TABS: ORAL_TABLET | ORAL | Status: DC

## 2011-08-12 NOTE — Progress Notes (Signed)
Patient presents complaining of vaginal discharge with itching of the last week or so. She does have a history of recurrent yeast infections in the past. She had used boric acid. Most recently she was treated in August with Diflucan.150 times one, then subsequently Diflucan 200x3 days and ultimately with Terazol 3 day cream. She has remained without symptoms until most recently.  Exam with chaperone present External BUS vagina with thick white discharge. Bimanual without masses or tenderness.  Assessment and plan: Wet prep is positive for yeast we'll treat with Diflucan 200 mg daily x5 days and then weekly x5 weeks total of 10 pills. Follow up if symptoms persist or recur.

## 2011-08-12 NOTE — Patient Instructions (Signed)
Take medication as we discussed

## 2011-09-07 DIAGNOSIS — A0472 Enterocolitis due to Clostridium difficile, not specified as recurrent: Secondary | ICD-10-CM

## 2011-09-07 HISTORY — DX: Enterocolitis due to Clostridium difficile, not specified as recurrent: A04.72

## 2011-09-22 ENCOUNTER — Encounter: Payer: Self-pay | Admitting: Gynecology

## 2012-02-23 ENCOUNTER — Encounter: Payer: Self-pay | Admitting: Gynecology

## 2012-02-23 ENCOUNTER — Ambulatory Visit (INDEPENDENT_AMBULATORY_CARE_PROVIDER_SITE_OTHER): Payer: 59 | Admitting: Gynecology

## 2012-02-23 VITALS — BP 114/70 | Ht 65.25 in | Wt 116.0 lb

## 2012-02-23 DIAGNOSIS — Z01419 Encounter for gynecological examination (general) (routine) without abnormal findings: Secondary | ICD-10-CM

## 2012-02-23 DIAGNOSIS — Z13220 Encounter for screening for lipoid disorders: Secondary | ICD-10-CM

## 2012-02-23 DIAGNOSIS — M949 Disorder of cartilage, unspecified: Secondary | ICD-10-CM

## 2012-02-23 DIAGNOSIS — M8580 Other specified disorders of bone density and structure, unspecified site: Secondary | ICD-10-CM

## 2012-02-23 DIAGNOSIS — Z131 Encounter for screening for diabetes mellitus: Secondary | ICD-10-CM

## 2012-02-23 DIAGNOSIS — E039 Hypothyroidism, unspecified: Secondary | ICD-10-CM

## 2012-02-23 DIAGNOSIS — G35 Multiple sclerosis: Secondary | ICD-10-CM

## 2012-02-23 LAB — COMPREHENSIVE METABOLIC PANEL
BUN: 14 mg/dL (ref 6–23)
CO2: 29 mEq/L (ref 19–32)
Calcium: 9.1 mg/dL (ref 8.4–10.5)
Chloride: 98 mEq/L (ref 96–112)
Creat: 0.83 mg/dL (ref 0.50–1.10)

## 2012-02-23 LAB — CBC WITH DIFFERENTIAL/PLATELET
Eosinophils Relative: 1 % (ref 0–5)
HCT: 40 % (ref 36.0–46.0)
Lymphocytes Relative: 16 % (ref 12–46)
Lymphs Abs: 1.3 10*3/uL (ref 0.7–4.0)
MCV: 88.3 fL (ref 78.0–100.0)
Monocytes Absolute: 0.7 10*3/uL (ref 0.1–1.0)
RBC: 4.53 MIL/uL (ref 3.87–5.11)
RDW: 13.2 % (ref 11.5–15.5)
WBC: 8.2 10*3/uL (ref 4.0–10.5)

## 2012-02-23 LAB — LIPID PANEL
Cholesterol: 200 mg/dL (ref 0–200)
HDL: 62 mg/dL (ref >39)
Total CHOL/HDL Ratio: 3.2 Ratio

## 2012-02-23 NOTE — Patient Instructions (Signed)
Office will forward a copy of your lab results to you. Mammography recommended annually. Follow up in one year for her annual gynecologic exam.

## 2012-02-23 NOTE — Progress Notes (Signed)
Ana Russell 01/17/1956 [id number]        56 y.o.  for annual exam.  Several issues noted below.  Past medical history,surgical history, medications, allergies, family history and social history were all reviewed and documented in the EPIC chart. ROS:  Was performed and pertinent positives and negatives are included in the history.  Exam: Ana Russell chaperone present Filed Vitals:   02/23/12 1105  BP: 114/70   General appearance  Normal Skin grossly normal Head/Neck normal with no cervical or supraclavicular adenopathy thyroid normal Lungs  clear Cardiac RR, without RMG Abdominal  soft, nontender, without masses, organomegaly or hernia Breasts  examined lying and sitting without masses, retractions, discharge or axillary adenopathy. Pelvic  Ext/BUS/vagina  normal with mild atrophic changes  Cervix  normal   Uterus  anteverted, normal size, shape and contour, midline and mobile nontender   Adnexa  Without masses or tenderness    Anus and perineum  normal   Rectovaginal  normal sphincter tone without palpated masses or tenderness.    Assessment/Plan:  56 y.o. female for annual exam.    1. Postmenopausal doing well without bleeding or significant hormonal symptoms. We'll continue to monitor. 2. Osteopenia. DEXA 02/2010 T score -2.4. FRAX major osteoporotic risk 6.6%/hip fracture risk 1.3%. Increase calcium vitamin D reviewed. We'll check baseline vitamin D level. We'll repeat DEXA now at a two-year interval. 3. Mammography. Patient due now. I reminded her to schedule this. She wanted to do every other year. I reviewed current guidelines recommend annual mammography. SBE monthly reviewed. 4. Pap smear. Last Pap smear 2012. No Pap smear was done today. She has no history of abnormal Pap smears with numerous normal reports in her chart. I reviewed current screening guidelines we'll plan every 3-5 year screen. 5. Colonoscopy. Patient had a colonoscopy January 2013. 6. Health maintenance.  Patient is fasting and asked for baseline blood work to she can forwarded to her other physicians who follow her for her MS/hypothyroid/health maintenance. Baseline CBC comprehensive metabolic panel, lipid profile, TSH, vitamin D and urinalysis ordered. Assuming she continues well from a gynecologic standpoint she will see me in a year, sooner as needed.    Ana Lords MD, 11:26 AM 02/23/2012

## 2012-02-24 LAB — URINALYSIS W MICROSCOPIC + REFLEX CULTURE
Bacteria, UA: NONE SEEN
Bilirubin Urine: NEGATIVE
Casts: NONE SEEN
Crystals: NONE SEEN
Ketones, ur: NEGATIVE mg/dL
Nitrite: NEGATIVE
Specific Gravity, Urine: 1.011 (ref 1.005–1.030)
pH: 6 (ref 5.0–8.0)

## 2012-02-25 ENCOUNTER — Telehealth: Payer: Self-pay | Admitting: *Deleted

## 2012-02-25 DIAGNOSIS — E7800 Pure hypercholesterolemia, unspecified: Secondary | ICD-10-CM

## 2012-02-25 NOTE — Telephone Encounter (Signed)
Attempted to return patient's call about wanting a Nutrition referral, but there was no answer.  There are no other numbers available to call patient.  Will wait for patient to call back.

## 2012-02-29 NOTE — Telephone Encounter (Signed)
Lm for patient to call

## 2012-03-01 NOTE — Telephone Encounter (Signed)
Patient states she is having weight loss and has issues with elevated chol.  Just not sure what to eat and needs the guidance.  Was told by Dr. Audie Box that we could send a referral for a nutritionist. Will send referral.

## 2012-03-02 ENCOUNTER — Ambulatory Visit (INDEPENDENT_AMBULATORY_CARE_PROVIDER_SITE_OTHER): Payer: 59

## 2012-03-02 DIAGNOSIS — M810 Age-related osteoporosis without current pathological fracture: Secondary | ICD-10-CM

## 2012-03-02 DIAGNOSIS — E039 Hypothyroidism, unspecified: Secondary | ICD-10-CM

## 2012-03-02 DIAGNOSIS — M8580 Other specified disorders of bone density and structure, unspecified site: Secondary | ICD-10-CM

## 2012-03-03 ENCOUNTER — Telehealth: Payer: Self-pay | Admitting: Gynecology

## 2012-03-03 ENCOUNTER — Encounter: Payer: Self-pay | Admitting: Gynecology

## 2012-03-03 NOTE — Telephone Encounter (Signed)
Tell patient that her bone density did show osteoporosis. Recommend office visit to discuss treatment options.

## 2012-03-03 NOTE — Telephone Encounter (Signed)
Pt informed with the below note, transferred to appointment desk 

## 2012-03-08 ENCOUNTER — Ambulatory Visit (INDEPENDENT_AMBULATORY_CARE_PROVIDER_SITE_OTHER): Payer: 59 | Admitting: Gynecology

## 2012-03-08 ENCOUNTER — Encounter: Payer: Self-pay | Admitting: Gynecology

## 2012-03-08 DIAGNOSIS — M810 Age-related osteoporosis without current pathological fracture: Secondary | ICD-10-CM

## 2012-03-08 NOTE — Progress Notes (Signed)
Patient presents with her husband to discuss her most recent DEXA  which shows a T score -2.5 at both right and left femoral necks. There is a statistically significant decline in bone density from baseline of 2009 at the spine and hip. I reviewed with them the risks of fracture and the options for treatment. I reviewed various forms of treatment to include bisphosphonates, Prolia, Evista, Forteo in HRT. My recommendation would be to start bisphosphonates such as alendronate 70 mg weekly. The issues of how to take the medication and the risks to include GERD, esophageal cancer, osteonecrosis of the jaw and atypical fractures particularly with prolonged use was all reviewed with them. The patient is on interferon for her MS and had questions about using the medication with this. I defer to her MS physician and asked her to discuss this with them to make sure they're comfortable with starting the alendronate. Assuming they are she can get back to me and we will prescribe at her choice. She is starting on a weightbearing exercise program and I encouraged her to do so.  I provided her a copy of her bone density to have available both for her other physician and her exercise trainer.

## 2012-03-08 NOTE — Patient Instructions (Signed)
Follow up with me with your decision about osteoporosis medication have to discuss this with your MS physician.

## 2012-03-14 ENCOUNTER — Encounter: Payer: Self-pay | Admitting: Gynecology

## 2012-03-16 ENCOUNTER — Telehealth: Payer: Self-pay | Admitting: *Deleted

## 2012-03-16 NOTE — Telephone Encounter (Signed)
Pt did follow up with her MS physician as directed on 03/08/12 office visit  and was given the okay for her to take the fosamax. Please advise

## 2012-03-17 MED ORDER — ALENDRONATE SODIUM 70 MG PO TABS: 70.0000 mg | ORAL_TABLET | ORAL | Status: DC

## 2012-03-17 NOTE — Telephone Encounter (Signed)
Pt informed

## 2012-03-17 NOTE — Telephone Encounter (Signed)
Alendronate 70 mg weekly as we discussed previously

## 2012-03-20 ENCOUNTER — Encounter: Payer: Self-pay | Admitting: *Deleted

## 2012-03-20 NOTE — Progress Notes (Signed)
Patient ID: Ana Russell, female   DOB: 1956/07/28, 56 y.o.   MRN:   Pt did not keep her scheduled appt with the [facility] (nutrition center)  that she requested for her weight loss. Per their office pt didn't call back after 3 attempts with left messages.

## 2012-07-31 ENCOUNTER — Telehealth: Payer: Self-pay | Admitting: *Deleted

## 2012-07-31 NOTE — Telephone Encounter (Signed)
Pt was told to start on fosamax 70 mg back in July 2013, pt said she never started medication. She doesn't want treat her osteoporosis taking medication, she has been seeing a Systems analyst and seen excellent results from this trainer. Pt said she walks everyday and uses equipment as well, she is paying out of pocket for this personal trainer. She has an HSA account that she could use to help with payments for the trainer, but was informed that a letter from MD would need to be on file. Pt is calling requesting a letter for this. Please advise

## 2012-07-31 NOTE — Telephone Encounter (Signed)
Okay for letter stating patient has osteoporosis and recommend weightbearing exercise as a medical treatment for this. Physical trainer consider legitimate charge for this treatment plan

## 2012-08-01 ENCOUNTER — Encounter: Payer: Self-pay | Admitting: *Deleted

## 2012-08-01 NOTE — Telephone Encounter (Signed)
Pt informed with the below note, letter mailed to pt home.

## 2013-02-23 ENCOUNTER — Encounter: Payer: Self-pay | Admitting: Gynecology

## 2013-02-23 ENCOUNTER — Ambulatory Visit (INDEPENDENT_AMBULATORY_CARE_PROVIDER_SITE_OTHER): Payer: 59 | Admitting: Gynecology

## 2013-02-23 VITALS — BP 120/70 | Ht 65.0 in | Wt 115.0 lb

## 2013-02-23 DIAGNOSIS — K602 Anal fissure, unspecified: Secondary | ICD-10-CM

## 2013-02-23 DIAGNOSIS — Z13220 Encounter for screening for lipoid disorders: Secondary | ICD-10-CM

## 2013-02-23 DIAGNOSIS — M810 Age-related osteoporosis without current pathological fracture: Secondary | ICD-10-CM

## 2013-02-23 DIAGNOSIS — Z01419 Encounter for gynecological examination (general) (routine) without abnormal findings: Secondary | ICD-10-CM

## 2013-02-23 LAB — LIPID PANEL
HDL: 70 mg/dL (ref >39)
LDL Cholesterol: 114 mg/dL — ABNORMAL HIGH (ref 0–99)
Total CHOL/HDL Ratio: 2.9 Ratio

## 2013-02-23 LAB — COMPREHENSIVE METABOLIC PANEL
ALT: 26 U/L (ref 0–35)
AST: 34 U/L (ref 0–37)
Alkaline Phosphatase: 54 U/L (ref 39–117)
BUN: 19 mg/dL (ref 6–23)
Creat: 0.87 mg/dL (ref 0.50–1.10)
Potassium: 4.2 mEq/L (ref 3.5–5.3)

## 2013-02-23 LAB — CBC WITH DIFFERENTIAL/PLATELET
Basophils Absolute: 0 10*3/uL (ref 0.0–0.1)
Basophils Relative: 0 % (ref 0–1)
Eosinophils Relative: 1 % (ref 0–5)
HCT: 39.4 % (ref 36.0–46.0)
MCHC: 33.5 g/dL (ref 30.0–36.0)
Monocytes Absolute: 0.4 10*3/uL (ref 0.1–1.0)
Neutro Abs: 1 10*3/uL — ABNORMAL LOW (ref 1.7–7.7)
Platelets: 168 10*3/uL (ref 150–400)
RDW: 13.5 % (ref 11.5–15.5)
WBC: 3 10*3/uL — ABNORMAL LOW (ref 4.0–10.5)

## 2013-02-23 NOTE — Patient Instructions (Signed)
I recommend that you schedule a mammogram. Followup in one year for annual exam.

## 2013-02-23 NOTE — Progress Notes (Signed)
Ana Russell 30-Oct-1955 [id number]        57 y.o.  G2P0020 for annual exam.  Several issues noted below.  Past medical history,surgical history, medications, allergies, family history and social history were all reviewed and documented in the EPIC chart.  ROS:  Performed and pertinent positives and negatives are included in the history, assessment and plan .  Exam: Kim assistant Filed Vitals:   02/23/13 1150  BP: 120/70  Height: 5\' 5"  (1.651 m)  Weight: 115 lb (52.164 kg)   General appearance  Normal Skin grossly normal Head/Neck normal with no cervical or supraclavicular adenopathy thyroid normal Lungs  clear Cardiac RR, without RMG Abdominal  soft, nontender, without masses, organomegaly or hernia Breasts  examined lying and sitting without masses, retractions, discharge or axillary adenopathy. Pelvic  Ext/BUS/vagina  normal with atrophic changes  Cervix  normal with atrophic changes   Uterus  anteverted, normal size, shape and contour, midline and mobile nontender   Adnexa  Without masses or tenderness    Anus and perineum  small linear fissure 4:00 anal verge   Rectovaginal  normal sphincter tone without palpated masses or tenderness.    Assessment/Plan:  57 y.o. G65P0020 female for annual exam.   1. Postmenopausal. Patient asymptomatic without significant hot flashes, night sweats, vaginal dryness or dyspareunia. We'll continue to monitor. 2. Osteoporosis. DEXA 02/2012 with T score -2.5. See office visit discussion 03/08/2012. Patient was to start on alendronate but never did. She does not want to take any medication. She is actively exercising and feels that this is enough. I discussed the fracture risk with her again she is not interested in taking medication at this time. She wants to repeat her bone density next year in a 2 year interval and then go from there. Vitamin D ordered today. Calcium recommendations reviewed 3. Mammography 02/2011. Recommended mammogram now and  patient declines. I reviewed the benefit for early detection and treatment. The patient clearly understands and declines. SBE monthly reviewed. 4. Pap smear 2012. No Pap smear done today. No history of significant abnormal Pap smears. Plan repeat next year 3 year interval. 5. Anal fissure. Newly noted by the patient. She is having a little spotting with bowel movements. Recommended Preparation H and followup if it continues. 6. Colonoscopy 2013. History of C. difficile last year. Repeated their recommended interval. 7. Health maintenance. Patient requests routine blood work I did a CBC comprehensive metabolic panel lipid profile urinalysis TSH and vitamin D. Follow up in one year, sooner as needed.    Dara Lords MD, 12:13 PM 02/23/2013

## 2013-02-24 LAB — URINALYSIS W MICROSCOPIC + REFLEX CULTURE
Bacteria, UA: NONE SEEN
Hgb urine dipstick: NEGATIVE
Nitrite: NEGATIVE
Protein, ur: NEGATIVE mg/dL
Urobilinogen, UA: 0.2 mg/dL (ref 0.0–1.0)

## 2013-02-24 LAB — VITAMIN D 25 HYDROXY (VIT D DEFICIENCY, FRACTURES): Vit D, 25-Hydroxy: 68 ng/mL (ref 30–89)

## 2013-02-24 LAB — TSH: TSH: 0.702 u[IU]/mL (ref 0.350–4.500)

## 2013-02-26 ENCOUNTER — Encounter: Payer: Self-pay | Admitting: Gynecology

## 2013-03-06 ENCOUNTER — Encounter: Payer: Self-pay | Admitting: Gynecology

## 2013-03-06 ENCOUNTER — Ambulatory Visit (INDEPENDENT_AMBULATORY_CARE_PROVIDER_SITE_OTHER): Payer: 59 | Admitting: Gynecology

## 2013-03-06 DIAGNOSIS — R300 Dysuria: Secondary | ICD-10-CM

## 2013-03-06 DIAGNOSIS — N390 Urinary tract infection, site not specified: Secondary | ICD-10-CM

## 2013-03-06 LAB — URINALYSIS W MICROSCOPIC + REFLEX CULTURE
Crystals: NONE SEEN
Ketones, ur: NEGATIVE mg/dL
Nitrite: NEGATIVE
Specific Gravity, Urine: 1.01 (ref 1.005–1.030)
Urobilinogen, UA: 0.2 mg/dL (ref 0.0–1.0)

## 2013-03-06 MED ORDER — SULFAMETHOXAZOLE-TRIMETHOPRIM 800-160 MG PO TABS: 1.0000 | ORAL_TABLET | Freq: Two times a day (BID) | ORAL | Status: DC

## 2013-03-06 NOTE — Progress Notes (Signed)
Patient presents complaining of dysuria and mild suprapubic discomfort. No fever chills nausea vomiting or other constitutional symptoms. No low back pain.  Exam Spine straight without CVA tenderness. Abdomen soft nontender without masses guarding rebound organomegaly.  Assessment and plan: Symptoms and urinalysis consistent with UTI. Treat with Septra DS 1 by mouth twice a day x3 days. Patient had concerns about Clostridium difficile she was treated for earlier. I asked her to call Dr. Kenna Gilbert office who treated her to make sure there is nothing she can do prophylactically to help decrease her risk during antibiotic treatment.

## 2013-03-06 NOTE — Patient Instructions (Signed)
Take antibiotic pill twice daily for 3 days. Followup if your symptoms persist, worsen or recur.

## 2013-03-08 LAB — URINE CULTURE: Colony Count: >=100000

## 2013-06-22 ENCOUNTER — Telehealth: Payer: Self-pay | Admitting: Neurology

## 2013-06-22 NOTE — Telephone Encounter (Signed)
Patient is wanting to schedule her MRI and yearly appt. Had previously declined but has since changed her mind. She would like an appt. Afterwards before the end of the year as well.

## 2013-07-06 ENCOUNTER — Telehealth: Payer: Self-pay | Admitting: *Deleted

## 2013-07-06 DIAGNOSIS — G35 Multiple sclerosis: Secondary | ICD-10-CM

## 2013-07-09 NOTE — Telephone Encounter (Signed)
Chart reviewed, RRMS, last visit was in Jan 2014, last MRI was in May 2012.  I will repeat MRI brain w/wo again before her follow up appt, please let her know.

## 2013-07-10 NOTE — Telephone Encounter (Signed)
Spoke to patient patient wants to have MRI In January and follow up with Dr. Terrace Arabia after that.

## 2013-07-12 ENCOUNTER — Other Ambulatory Visit: Payer: Self-pay

## 2013-09-10 ENCOUNTER — Other Ambulatory Visit: Payer: Self-pay | Admitting: Neurology

## 2013-09-13 ENCOUNTER — Ambulatory Visit (INDEPENDENT_AMBULATORY_CARE_PROVIDER_SITE_OTHER): Payer: 59

## 2013-09-13 DIAGNOSIS — G35 Multiple sclerosis: Secondary | ICD-10-CM

## 2013-09-13 MED ORDER — GADOPENTETATE DIMEGLUMINE 469.01 MG/ML IV SOLN: 11.0000 mL | Freq: Once | INTRAVENOUS | Status: AC | PRN

## 2013-09-17 NOTE — Progress Notes (Signed)
Quick Note:  Hinton Dyer, please call, give her a follow up appointment, so I can review MRI film together with her. ______

## 2013-09-20 ENCOUNTER — Ambulatory Visit (INDEPENDENT_AMBULATORY_CARE_PROVIDER_SITE_OTHER): Payer: 59 | Admitting: Neurology

## 2013-09-20 ENCOUNTER — Encounter: Payer: Self-pay | Admitting: Neurology

## 2013-09-20 VITALS — BP 104/71 | HR 74 | Ht 66.5 in | Wt 126.0 lb

## 2013-09-20 DIAGNOSIS — E039 Hypothyroidism, unspecified: Secondary | ICD-10-CM

## 2013-09-20 DIAGNOSIS — D509 Iron deficiency anemia, unspecified: Secondary | ICD-10-CM

## 2013-09-20 DIAGNOSIS — G35 Multiple sclerosis: Secondary | ICD-10-CM

## 2013-09-20 NOTE — Progress Notes (Signed)
[facility]  PATIENT: Ana Russell DOB: June 26, 1956  HISTORICAL Ana Russell is a 58 years old right-handed Caucasian female, came in to followup for relapsing remitting multiple sclerosis,  Initial symptoms in 2000 she presented with diplopia, but has never received any treatment, she was hospitalized  In July 200 for acute onset of slurred speech, double vision, difficulty walking, MRI at that time has demonstrated right midbrain lesions, positive DWI,  She has been treated with Rebif since 2009, over the past few years, there was no relapsing episode, she is a Curator, exercise regularly, there was no significant change clinically.  She only has mild gait difficulty, taking her Rebif on Monday, Wednesday, Friday, premedicated with Advil, overall tolerating the medication very well  Most recent MRI of the brain was 2012, had a repeat scan in January 20/15 his, she came in to review her MRI findings, we have seen the films together, with continued evidence of multiple supratentorium periventricular oval-shaped lesion, there was T1 black holes, no contrast enhancement,   After discussed with patient, she is overall doing well clinically fairly stable imaging-wise, she wants to stay on current treatment,  She reported a history of mildly decreased white count, has been under close supervision of her primary care physician Dr. Noah Delaine at [facility], I do not have recent laboratory evaluations,  REVIEW OF SYSTEMS: Full 14 system review of systems performed and notable only for mild gait difficulty  ALLERGIES: No Known Allergies  HOME MEDICATIONS: Outpatient Prescriptions Prior to Visit  Medication Sig Dispense Refill  . Cholecalciferol (VITAMIN D PO) Take by mouth.        . Levothyroxine Sodium (SYNTHROID PO) Take by mouth. 100MCG        . REBIF 44 MCG/0.5ML injection Inject subcutaneously 39mg three times a week  6 mL  1  .  sulfamethoxazole-trimethoprim (SEPTRA DS) 800-160 MG per tablet Take 1 tablet by mouth 2 (two) times daily. For 3 days  6 tablet  0   No facility-administered medications prior to visit.    PAST MEDICAL HISTORY: Past Medical History  Diagnosis Date  . Vaginal infection     RECURRENT VAGINAL INFECTIONS  . Endometrial hyperplasia without atypia, simple 09/2003    NORMAL ENDOMETRIAL BIOPSY 03/2005  . Hypothyroidism   . Osteoporosis 02/2012    T score -2.5  . Multiple sclerosis   . MS (multiple sclerosis) 02/25/2011  . Fluid retention     in ears  . C. difficile colitis 2013    PAST SURGICAL HISTORY: Past Surgical History  Procedure Laterality Date  . Dilation and curettage of uterus      TAB  . Hysteroscopy  09/2003    POLYPS  . Thyroidectomy, partial    . Knee surgery  2011    bilateral knee scopes  . Pelvic laparoscopy      FAMILY HISTORY: Family History  Problem Relation Age of Onset  . Diabetes Mother   . Diabetes Paternal Grandfather     SOCIAL HISTORY:  History   Social History  . Marital Status: Married    Spouse Name: Ed    Number of Children: 0  . Years of Education: Bachelors   Occupational History  . Not on file.   Social History Main Topics  . Smoking status: Former SResearch scientist (life sciences) . Smokeless tobacco: Never Used  . Alcohol Use: Yes     Comment: TUES THURS SAT SUN WINE  . Drug Use: No  . Sexual Activity:  Yes    Birth Control/ Protection: Post-menopausal   Other Topics Concern  . Not on file   Social History Narrative   Patient lives at home with husband (Ed)   Patient is right handed   Education level Bachelors degree   Caffeine consumption is 3 cups daily     PHYSICAL EXAM   Filed Vitals:   09/20/13 0815  BP: 104/71  Pulse: 74  Height: 5' 6.5" (1.689 m)  Weight: 126 lb (57.153 kg)    Not recorded    Body mass index is 20.03 kg/(m^2).   Generalized: In no acute distress  Neck: Supple, no carotid bruits   Cardiac: Regular  rate rhythm  Pulmonary: Clear to auscultation bilaterally  Musculoskeletal: No deformity  Neurological examination  Mentation: Alert oriented to time, place, history taking, and causual conversation  Cranial nerve II-XII: Pupils were equal round reactive to light extraocular movements were full, Visual field were full on confrontational test. Bilateral fundi were sharp.  Facial sensation and strength were normal. Hearing was intact to finger rubbing bilaterally. Uvula tongue midline.  head turning and shoulder shrug and were normal and symmetric.Tongue protrusion into cheek strength was normal.  Motor: normal tone, bulk and strength.  Sensory: Intact to fine touch, pinprick, preserved vibratory sensation, and proprioception at toes.  Coordination: Normal finger to nose, heel-to-shin bilaterally there was no truncal ataxia  Gait: Rising up from seated position without assistance, normal stance, without trunk ataxia, moderate stride, good arm swing, smooth turning, able to perform tiptoe, and heel, mild difficulty with tandem walking Romberg signs: Negative  Deep tendon reflexes: Brachioradialis 2/2, biceps 2/2, triceps 2/2, patellar 2/2, Achilles 2/2, plantar responses were flexor bilaterally.   DIAGNOSTIC DATA (LABS, IMAGING, TESTING) - I reviewed patient records, labs, notes, testing and imaging myself where available.  Lab Results  Component Value Date   WBC 3.0* 02/23/2013   HGB 13.2 02/23/2013   HCT 39.4 02/23/2013   MCV 87.9 02/23/2013   PLT 168 02/23/2013      Component Value Date/Time   NA 137 02/23/2013 1217   K 4.2 02/23/2013 1217   CL 101 02/23/2013 1217   CO2 26 02/23/2013 1217   GLUCOSE 86 02/23/2013 1217   BUN 19 02/23/2013 1217   CREATININE 0.87 02/23/2013 1217   CREATININE 0.7 02/25/2011 0929   CALCIUM 9.4 02/23/2013 1217   PROT 6.7 02/23/2013 1217   ALBUMIN 4.1 02/23/2013 1217   AST 34 02/23/2013 1217   ALT 26 02/23/2013 1217   ALKPHOS 54 02/23/2013 1217   BILITOT 0.4  02/23/2013 1217   GFRNONAA 87.96 01/12/2010 1519   GFRAA  Value: >60        The eGFR has been calculated using the MDRD equation. This calculation has not been validated in all clinical 06/26/2008 0820   Lab Results  Component Value Date   CHOL 204* 02/23/2013   HDL 70 02/23/2013   LDLCALC 114* 02/23/2013   LDLDIRECT 121.7 02/25/2011   TRIG 101 02/23/2013   CHOLHDL 2.9 02/23/2013   No results found for this basename: HGBA1C   Lab Results  Component Value Date   VITAMINB12 387 05/10/2008   Lab Results  Component Value Date   TSH 0.702 02/23/2013      ASSESSMENT AND PLAN   Mrs. Ovid Curd is a 58 years old right-handed Caucasian female, with a long-standing history of relapsing remitting multiple sclerosis, overall she is stable clinically, and imaging-wise there is no clinical flareups  1 we have reviewed  her MRI films together, no significant progression 2 after discussion, she decided to stay on Rebif 3 return to clinic in one year with Hoyle Sauer .      Marcial Pacas, M.D. Ph.D.  [facility] [address] [place], [place] [address] [phone]

## 2013-11-12 ENCOUNTER — Other Ambulatory Visit: Payer: Self-pay

## 2013-11-12 MED ORDER — INTERFERON BETA-1A 44 MCG/0.5ML ~~LOC~~ SOLN: 44.0000 ug | SUBCUTANEOUS | Status: DC

## 2014-01-25 ENCOUNTER — Ambulatory Visit (INDEPENDENT_AMBULATORY_CARE_PROVIDER_SITE_OTHER): Payer: 59 | Admitting: Women's Health

## 2014-01-25 ENCOUNTER — Encounter: Payer: Self-pay | Admitting: Women's Health

## 2014-01-25 DIAGNOSIS — N898 Other specified noninflammatory disorders of vagina: Secondary | ICD-10-CM

## 2014-01-25 DIAGNOSIS — R350 Frequency of micturition: Secondary | ICD-10-CM

## 2014-01-25 LAB — URINALYSIS W MICROSCOPIC + REFLEX CULTURE
Bilirubin Urine: NEGATIVE
Glucose, UA: NEGATIVE mg/dL
Hgb urine dipstick: NEGATIVE
KETONES UR: NEGATIVE mg/dL
LEUKOCYTES UA: NEGATIVE
NITRITE: NEGATIVE
PH: 5.5 (ref 5.0–8.0)
Protein, ur: NEGATIVE mg/dL
SPECIFIC GRAVITY, URINE: 1.01 (ref 1.005–1.030)
Urobilinogen, UA: 0.2 mg/dL (ref 0.0–1.0)

## 2014-01-25 LAB — WET PREP FOR TRICH, YEAST, CLUE
CLUE CELLS WET PREP: NONE SEEN
TRICH WET PREP: NONE SEEN
YEAST WET PREP: NONE SEEN

## 2014-01-25 NOTE — Progress Notes (Signed)
Patient ID: Ana Russell, female   DOB: [birth year], 58 y.o.   MRN: [id number] Presents with complaint of urinary frequency, which is common for her, slight vaginal itching for several days. Denies discharge, abdominal pain or fever. States has been working in her garden in the heat, states does not do well with heat, and questions if she may have gotten  slightly dehydrated. History of C Diff, usually has 1 UTI yearly. Multiple sclerosis and hypothyroid.  Exam: Appears well. UA: Negative. External genitalia no visible erythema, speculum exam vaginal atrophy, wet prep negative.  Vaginal atrophy Urinary frequency  Plan: Urine culture pending. Reviewed importance of increasing fluids especially when outside in the heat, avoiding the heat, instructed to call if continued symptoms. Vaginal lubricants encouraged for intercourse.

## 2014-01-26 LAB — URINE CULTURE
Colony Count: NO GROWTH
Organism ID, Bacteria: NO GROWTH

## 2014-02-25 ENCOUNTER — Other Ambulatory Visit (HOSPITAL_COMMUNITY)
Admission: RE | Admit: 2014-02-25 | Discharge: 2014-02-25 | Disposition: A | Discharge status: Home / Self Care | Payer: 59 | Source: Ambulatory Visit | Attending: Gynecology | Admitting: Gynecology

## 2014-02-25 ENCOUNTER — Encounter: Payer: Self-pay | Admitting: Gynecology

## 2014-02-25 ENCOUNTER — Ambulatory Visit (INDEPENDENT_AMBULATORY_CARE_PROVIDER_SITE_OTHER): Payer: 59 | Admitting: Gynecology

## 2014-02-25 VITALS — BP 120/78 | Ht 65.0 in | Wt 119.0 lb

## 2014-02-25 DIAGNOSIS — Z01419 Encounter for gynecological examination (general) (routine) without abnormal findings: Secondary | ICD-10-CM

## 2014-02-25 DIAGNOSIS — M810 Age-related osteoporosis without current pathological fracture: Secondary | ICD-10-CM

## 2014-02-25 DIAGNOSIS — Z1151 Encounter for screening for human papillomavirus (HPV): Secondary | ICD-10-CM | POA: Insufficient documentation

## 2014-02-25 DIAGNOSIS — Z124 Encounter for screening for malignant neoplasm of cervix: Secondary | ICD-10-CM | POA: Insufficient documentation

## 2014-02-25 LAB — CBC WITH DIFFERENTIAL/PLATELET
Basophils Absolute: 0 10*3/uL (ref 0.0–0.1)
Basophils Relative: 0 % (ref 0–1)
EOS PCT: 1 % (ref 0–5)
Eosinophils Absolute: 0 10*3/uL (ref 0.0–0.7)
HCT: 37.6 % (ref 36.0–46.0)
Hemoglobin: 13.1 g/dL (ref 12.0–15.0)
LYMPHS PCT: 45 % (ref 12–46)
Lymphs Abs: 1.4 10*3/uL (ref 0.7–4.0)
MCH: 30.3 pg (ref 26.0–34.0)
MCHC: 34.8 g/dL (ref 30.0–36.0)
MCV: 86.8 fL (ref 78.0–100.0)
Monocytes Absolute: 0.4 10*3/uL (ref 0.1–1.0)
Monocytes Relative: 12 % (ref 3–12)
NEUTROS ABS: 1.3 10*3/uL — AB (ref 1.7–7.7)
Neutrophils Relative %: 42 % — ABNORMAL LOW (ref 43–77)
PLATELETS: 173 10*3/uL (ref 150–400)
RBC: 4.33 MIL/uL (ref 3.87–5.11)
RDW: 13.8 % (ref 11.5–15.5)
WBC: 3.1 10*3/uL — AB (ref 4.0–10.5)

## 2014-02-25 LAB — COMPREHENSIVE METABOLIC PANEL
ALT: 24 U/L (ref 0–35)
AST: 28 U/L (ref 0–37)
Albumin: 4.2 g/dL (ref 3.5–5.2)
Alkaline Phosphatase: 55 U/L (ref 39–117)
BUN: 21 mg/dL (ref 6–23)
CALCIUM: 9.5 mg/dL (ref 8.4–10.5)
CHLORIDE: 100 meq/L (ref 96–112)
CO2: 30 mEq/L (ref 19–32)
Creat: 0.87 mg/dL (ref 0.50–1.10)
Glucose, Bld: 127 mg/dL — ABNORMAL HIGH (ref 70–99)
Potassium: 3.7 mEq/L (ref 3.5–5.3)
Sodium: 137 mEq/L (ref 135–145)
Total Bilirubin: 0.3 mg/dL (ref 0.2–1.2)
Total Protein: 6.8 g/dL (ref 6.0–8.3)

## 2014-02-25 LAB — LIPID PANEL
Cholesterol: 208 mg/dL — ABNORMAL HIGH (ref 0–200)
HDL: 63 mg/dL (ref >39)
LDL Cholesterol: 116 mg/dL — ABNORMAL HIGH (ref 0–99)
Total CHOL/HDL Ratio: 3.3 Ratio
Triglycerides: 147 mg/dL (ref <150)
VLDL: 29 mg/dL (ref 0–40)

## 2014-02-25 LAB — TSH: TSH: 0.469 u[IU]/mL (ref 0.350–4.500)

## 2014-02-25 NOTE — Patient Instructions (Signed)
Call to Schedule your mammogram  Facilities in [place]: 1)  [facility], [address] Phone: [phone])  [facility]. [address] [place] Phone: [phone] 3)  Dr. Isaiah Blakes at [facility] [address] Phone: [phone]     Mammogram A mammogram is an X-ray test to find changes in a woman's breast. You should get a mammogram if:  You are 59 years of age or older  You have risk factors.   Your doctor recommends that you have one.  BEFORE THE TEST  Do not schedule the test the week before your period, especially if your breasts are sore during this time.  On the day of your mammogram:  Wash your breasts and armpits well. After washing, do not put on any deodorant or talcum powder on until after your test.   Eat and drink as you usually do.   Take your medicines as usual.   If you are diabetic and take insulin, make sure you:   Eat before coming for your test.   Take your insulin as usual.   If you cannot keep your appointment, call before the appointment to cancel. Schedule another appointment.  TEST  You will need to undress from the waist up. You will put on a hospital gown.   Your breast will be put on the mammogram machine, and it will press firmly on your breast with a piece of plastic called a compression paddle. This will make your breast flatter so that the machine can X-ray all parts of your breast.   Both breasts will be X-rayed. Each breast will be X-rayed from above and from the side. An X-ray might need to be taken again if the picture is not good enough.   The mammogram will last about 15 to 30 minutes.  AFTER THE TEST Finding out the results of your test Ask when your test results will be ready. Make sure you get your test results.  Document Released: 11/19/2008 Document Revised: 08/12/2011 Document Reviewed: 11/19/2008 [facility] Patient  Information 2012 [employer] [place].   Strongly recommend that you schedule a followup bone density.  You may obtain a copy of any labs that were done today by logging onto MyChart as outlined in the instructions provided with your AVS (after visit summary). The office will not call with normal lab results but certainly if there are any significant abnormalities then we will contact you.   Health Maintenance, Female A healthy lifestyle and preventative care can promote health and wellness.  Maintain regular health, dental, and eye exams.  Eat a healthy diet. Foods like vegetables, fruits, whole grains, low-fat dairy products, and lean protein foods contain the nutrients you need without too many calories. Decrease your intake of foods high in solid fats, added sugars, and salt. Get information about a proper diet from your caregiver, if necessary.  Regular physical exercise is one of the most important things you can do for your health. Most adults should get at least 150 minutes of moderate-intensity exercise (any activity that increases your heart rate and causes you to sweat) each week. In addition, most adults need muscle-strengthening exercises on 2 or more days a week.   Maintain a healthy weight. The body mass index (BMI) is a screening tool to identify possible weight problems. It provides an estimate of body fat based on height and weight. Your caregiver can help determine your BMI, and  can help you achieve or maintain a healthy weight. For adults 20 years and older:  A BMI below 18.5 is considered underweight.  A BMI of 18.5 to 24.9 is normal.  A BMI of 25 to 29.9 is considered overweight.  A BMI of 30 and above is considered obese.  Maintain normal blood lipids and cholesterol by exercising and minimizing your intake of saturated fat. Eat a balanced diet with plenty of fruits and vegetables. Blood tests for lipids and cholesterol should begin at age 82 and be repeated every 5 years.  If your lipid or cholesterol levels are high, you are over 50, or you are a high risk for heart disease, you may need your cholesterol levels checked more frequently.Ongoing high lipid and cholesterol levels should be treated with medicines if diet and exercise are not effective.  If you smoke, find out from your caregiver how to quit. If you do not use tobacco, do not start.  Lung cancer screening is recommended for adults aged 110 80 years who are at high risk for developing lung cancer because of a history of smoking. Yearly low-dose computed tomography (CT) is recommended for people who have at least a 30-pack-year history of smoking and are a current smoker or have quit within the past 15 years. A pack year of smoking is smoking an average of 1 pack of cigarettes a day for 1 year (for example: 1 pack a day for 30 years or 2 packs a day for 15 years). Yearly screening should continue until the smoker has stopped smoking for at least 15 years. Yearly screening should also be stopped for people who develop a health problem that would prevent them from having lung cancer treatment.  If you are pregnant, do not drink alcohol. If you are breastfeeding, be very cautious about drinking alcohol. If you are not pregnant and choose to drink alcohol, do not exceed 1 drink per day. One drink is considered to be 12 ounces (355 mL) of beer, 5 ounces (148 mL) of wine, or 1.5 ounces (44 mL) of liquor.  Avoid use of street drugs. Do not share needles with anyone. Ask for help if you need support or instructions about stopping the use of drugs.  High blood pressure causes heart disease and increases the risk of stroke. Blood pressure should be checked at least every 1 to 2 years. Ongoing high blood pressure should be treated with medicines, if weight loss and exercise are not effective.  If you are 65 to 59 years old, ask your caregiver if you should take aspirin to prevent strokes.  Diabetes screening involves  taking a blood sample to check your fasting blood sugar level. This should be done once every 3 years, after age 49, if you are within normal weight and without risk factors for diabetes. Testing should be considered at a younger age or be carried out more frequently if you are overweight and have at least 1 risk factor for diabetes.  Breast cancer screening is essential preventative care for women. You should practice "breast self-awareness." This means understanding the normal appearance and feel of your breasts and may include breast self-examination. Any changes detected, no matter how small, should be reported to a caregiver. Women in their 52s and 30s should have a clinical breast exam (CBE) by a caregiver as part of a regular health exam every 1 to 3 years. After age 6, women should have a CBE every year. Starting at age 26, women should consider  having a mammogram (breast X-ray) every year. Women who have a family history of breast cancer should talk to their caregiver about genetic screening. Women at a high risk of breast cancer should talk to their caregiver about having an MRI and a mammogram every year.  Breast cancer gene (BRCA)-related cancer risk assessment is recommended for women who have family members with BRCA-related cancers. BRCA-related cancers include breast, ovarian, tubal, and peritoneal cancers. Having family members with these cancers may be associated with an increased risk for harmful changes (mutations) in the breast cancer genes BRCA1 and BRCA2. Results of the assessment will determine the need for genetic counseling and BRCA1 and BRCA2 testing.  The Pap test is a screening test for cervical cancer. Women should have a Pap test starting at age 41. Between ages 27 and 1, Pap tests should be repeated every 2 years. Beginning at age 16, you should have a Pap test every 3 years as long as the past 3 Pap tests have been normal. If you had a hysterectomy for a problem that was not  cancer or a condition that could lead to cancer, then you no longer need Pap tests. If you are between ages 27 and 60, and you have had normal Pap tests going back 10 years, you no longer need Pap tests. If you have had past treatment for cervical cancer or a condition that could lead to cancer, you need Pap tests and screening for cancer for at least 20 years after your treatment. If Pap tests have been discontinued, risk factors (such as a new sexual partner) need to be reassessed to determine if screening should be resumed. Some women have medical problems that increase the chance of getting cervical cancer. In these cases, your caregiver may recommend more frequent screening and Pap tests.  The human papillomavirus (HPV) test is an additional test that may be used for cervical cancer screening. The HPV test looks for the virus that can cause the cell changes on the cervix. The cells collected during the Pap test can be tested for HPV. The HPV test could be used to screen women aged 31 years and older, and should be used in women of any age who have unclear Pap test results. After the age of 56, women should have HPV testing at the same frequency as a Pap test.  Colorectal cancer can be detected and often prevented. Most routine colorectal cancer screening begins at the age of 22 and continues through age 50. However, your caregiver may recommend screening at an earlier age if you have risk factors for colon cancer. On a yearly basis, your caregiver may provide home test kits to check for hidden blood in the stool. Use of a small camera at the end of a tube, to directly examine the colon (sigmoidoscopy or colonoscopy), can detect the earliest forms of colorectal cancer. Talk to your caregiver about this at age 40, when routine screening begins. Direct examination of the colon should be repeated every 5 to 10 years through age 47, unless early forms of pre-cancerous polyps or small growths are  found.  Hepatitis C blood testing is recommended for all people born from [birth year] through 1966 and any individual with known risks for hepatitis C.  Practice safe sex. Use condoms and avoid high-risk sexual practices to reduce the spread of sexually transmitted infections (STIs). Sexually active women aged 32 and younger should be checked for Chlamydia, which is a common sexually transmitted infection. Older women with new  or multiple partners should also be tested for Chlamydia. Testing for other STIs is recommended if you are sexually active and at increased risk.  Osteoporosis is a disease in which the bones lose minerals and strength with aging. This can result in serious bone fractures. The risk of osteoporosis can be identified using a bone density scan. Women ages 9 and over and women at risk for fractures or osteoporosis should discuss screening with their caregivers. Ask your caregiver whether you should be taking a calcium supplement or vitamin D to reduce the rate of osteoporosis.  Menopause can be associated with physical symptoms and risks. Hormone replacement therapy is available to decrease symptoms and risks. You should talk to your caregiver about whether hormone replacement therapy is right for you.  Use sunscreen. Apply sunscreen liberally and repeatedly throughout the day. You should seek shade when your shadow is shorter than you. Protect yourself by wearing long sleeves, pants, a wide-brimmed hat, and sunglasses year round, whenever you are outdoors.  Notify your caregiver of new moles or changes in moles, especially if there is a change in shape or color. Also notify your caregiver if a mole is larger than the size of a pencil eraser.  Stay current with your immunizations. Document Released: 03/08/2011 Document Revised: 12/18/2012 Document Reviewed: 03/08/2011 [facility] Patient Information 2014 [place].

## 2014-02-25 NOTE — Progress Notes (Signed)
Ana Russell 04/21/1956 [id number]        58 y.o.  G2P0020 for annual exam.  Doing well without complaints.  Past medical history,surgical history, problem list, medications, allergies, family history and social history were all reviewed and documented as reviewed in the EPIC chart.  ROS:  12 system ROS performed with pertinent positives and negatives included in the history, assessment and plan.  Included Systems: General, HEENT, Neck, Cardiovascular, Pulmonary, Gastrointestinal, Genitourinary, Musculoskeletal, Dermatologic, Endocrine, Hematological, Neurologic, Psychiatric Additional significant findings :  None   Exam: Kim assistant Filed Vitals:   02/25/14 1202  BP: 120/78  Height: 5\' 5"  (1.651 m)  Weight: 119 lb (53.978 kg)   General appearance:  Normal affect, orientation and appearance. Skin: Grossly normal HEENT: Without gross lesions.  No cervical or supraclavicular adenopathy. Thyroid normal.  Lungs:  Clear without wheezing, rales or rhonchi Cardiac: RR, without RMG Abdominal:  Soft, nontender, without masses, guarding, rebound, organomegaly or hernia Breasts:  Examined lying and sitting without masses, retractions, discharge or axillary adenopathy. Pelvic:  Ext/BUS/vagina normal with mild atrophic changes  Cervix normal with atrophic changes. Pap/HPV  Uterus anteverted, normal size, shape and contour, midline and mobile nontender   Adnexa  Without masses or tenderness    Anus and perineum  Normal   Rectovaginal  Normal sphincter tone without palpated masses or tenderness.    Assessment/Plan:  59 y.o. G62P0020 female for annual exam.   1. Postmenopausal. Patient without significant hot flushes, night sweats, vaginal dryness, dyspareunia. No vaginal bleeding. Will continue to monitor. Call if any vaginal bleeding. 2. Osteoporosis. [facility] 02/2012 T score -2.5. Previously discussed treatment options and recommended treatment and patient refused. Recommended repeat DEXA now  and patient refuses. She's exercising on a regular basis and does not feel the need. I reviewed with her that progressive osteoporosis is a silent disease and the first indication may be a significant fracture. Also reviewed with her that with progressive bone loss treatment will not replace the lost bone in that if she continues to lose bone and this goes on and recognized or untreated she may be in a situation where it becomes difficult to treat her and prevent fractures. Patient clearly understands these issues and refuses bone density or treatment. I asked her to think about this, reconsider and call if she changes her mind. Increased calcium and vitamin D recommendations reviewed. 3. Mammography 02/2011. Patient knows she is overdue and refuses to schedule a mammogram. She feels they are not necessary. I reviewed that breast cancer is the most common cancer in women and early detection can often lead to cure her where late detection does not. Patient clearly understands my strong recommendation that she schedule a mammogram and she refuses this. SBE monthly reviewed. 4. Pap smear 2012. Pap/HPV today. No history of significant abnormal Pap smears previously. Assuming normal then plan repeat at 3-5 year interval per current screening guidelines. 5. Colonoscopy 2013. Repeat at their recommended interval. 6. Health maintenance. Patient actively sees her primary physician who does blood work but she requested that I also do routine blood work. CBC comprehensive metabolic panel lipid profile TSH vitamin D urinalysis ordered. Followup in one year, sooner as needed.   Note: This document was prepared with digital dictation and possible smart phrase technology. Any transcriptional errors that result from this process are unintentional.   Anastasio Auerbach MD, 12:41 PM 02/25/2014

## 2014-02-26 LAB — URINALYSIS W MICROSCOPIC + REFLEX CULTURE
BACTERIA UA: NONE SEEN
Bilirubin Urine: NEGATIVE
CASTS: NONE SEEN
CRYSTALS: NONE SEEN
GLUCOSE, UA: NEGATIVE mg/dL
HGB URINE DIPSTICK: NEGATIVE
KETONES UR: NEGATIVE mg/dL
Leukocytes, UA: NEGATIVE
Nitrite: NEGATIVE
PH: 6 (ref 5.0–8.0)
Protein, ur: NEGATIVE mg/dL
Squamous Epithelial / LPF: NONE SEEN
Urobilinogen, UA: 0.2 mg/dL (ref 0.0–1.0)

## 2014-02-26 LAB — VITAMIN D 25 HYDROXY (VIT D DEFICIENCY, FRACTURES): VIT D 25 HYDROXY: 65 ng/mL (ref 30–89)

## 2014-02-27 LAB — CYTOLOGY - PAP

## 2014-07-08 ENCOUNTER — Encounter: Payer: Self-pay | Admitting: Gynecology

## 2014-09-19 ENCOUNTER — Encounter: Payer: Self-pay | Admitting: Neurology

## 2014-09-19 ENCOUNTER — Ambulatory Visit (INDEPENDENT_AMBULATORY_CARE_PROVIDER_SITE_OTHER): Payer: 59 | Admitting: Neurology

## 2014-09-19 VITALS — BP 126/78 | HR 77 | Ht 66.0 in | Wt 116.0 lb

## 2014-09-19 DIAGNOSIS — G35 Multiple sclerosis: Secondary | ICD-10-CM

## 2014-09-19 NOTE — Progress Notes (Addendum)
[facility]  PATIENT: Ana Russell DOB: 03-22-1956  HISTORICAL Ana Russell is a 59 years old right-handed Caucasian female, came in to followup for relapsing remitting multiple sclerosis.  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  She only has mild gait difficulty, taking her Rebif on Monday, Wednesday, Friday, premedicated with Advil, overall tolerating the medication very well  Most recent MRI of the brain was 2012, had a repeat scan in January 20/15 his, she came in to review her MRI findings, we have seen the films together, with continued evidence of multiple supratentorium periventricular oval-shaped lesion, there was T1 black holes, no contrast enhancement,   After discussed with patient, she is overall doing well clinically fairly stable imaging-wise, she wants to stay on current treatment,  She reported a history of mildly decreased white count, has been under close supervision of her primary care physician Dr. Noah Delaine at [facility], I do not have recent laboratory evaluations  UPDATE Jan 14th 2016:  She is doing very well, enjoys her painting, last repeat MRI January 2015, MRI scans of brain showing extensive periventricular and subcortical white matter hyperintensities consistent with multiple sclerosis. No enhancing lesions are noted. Presence of multiple T1 black holes indicates chronic disease. 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 I have reviewed MRI of the brain with patient, there was no  significant changes compared to previous scans.  She is continuing rebif Monday Wednesday Friday, premedicated with Advil,  She has mild unsteady gait, using walking sticks, occasionally   Most recent laboratory showed mild elevated glucose 127, normal liver functional test, mild elevated LDL 116  REVIEW OF SYSTEMS: Full 14 system review of systems performed and notable only for mild gait difficulty  ALLERGIES: No Known Allergies  HOME MEDICATIONS: Outpatient Prescriptions Prior to Visit  Medication Sig Dispense Refill  . Cholecalciferol (VITAMIN D PO) Take by mouth.      . interferon beta-1a (REBIF) 44 MCG/0.5ML injection Inject 0.5 mLs (44 mcg total) into the skin 3 (three) times a week. 6 mL 11  . Levothyroxine Sodium (SYNTHROID PO) Take by mouth. 100MCG       No facility-administered medications prior to visit.    PAST MEDICAL HISTORY: Past Medical History  Diagnosis Date  . Vaginal infection     RECURRENT VAGINAL INFECTIONS  . Endometrial hyperplasia without atypia, simple 09/2003    NORMAL ENDOMETRIAL BIOPSY 03/2005  . Hypothyroidism   . Osteoporosis 02/2012    T score -2.5  . Multiple sclerosis   . MS (multiple sclerosis) 02/25/2011  . Fluid retention     in ears  . C. difficile colitis 2013    PAST SURGICAL HISTORY: Past Surgical History  Procedure Laterality Date  . Dilation and curettage of uterus      TAB  . Hysteroscopy  09/2003    POLYPS  . Thyroidectomy, partial    . Knee surgery  2011    bilateral knee scopes  . Pelvic laparoscopy      FAMILY HISTORY: Family History  Problem Relation Age of Onset  .  Diabetes Mother   . Diabetes Paternal Grandfather     SOCIAL HISTORY:  History   Social History  . Marital Status: Married    Spouse Name: Ed    Number of Children: 0  . Years of Education: Bachelors   Occupational History  . Not on file.   Social History Main Topics  . Smoking status: Former Research scientist (life sciences)  . Smokeless tobacco: Never Used       Comment: Quit 8 years ago.  . Alcohol Use: 0.0 oz/week    0 Not specified per week     Comment: TUES THURS SAT SUN WINE  . Drug Use: No  . Sexual Activity: Yes    Birth Control/ Protection: Post-menopausal   Other Topics Concern  . Not on file   Social History Narrative   Patient lives at home with husband (Ed)   Patient is right handed   Education level Bachelors degree   Caffeine consumption is 3 cups daily    PHYSICAL EXAM   Filed Vitals:   09/19/14 1315  BP: 126/78  Pulse: 77  Height: '5\' 6"'  (1.676 m)  Weight: 116 lb (52.617 kg)    Not recorded      Body mass index is 18.73 kg/(m^2).   Generalized: In no acute distress  Neck: Supple, no carotid bruits   Cardiac: Regular rate rhythm  Pulmonary: Clear to auscultation bilaterally  Musculoskeletal: No deformity  Neurological examination  Mentation: Alert oriented to time, place, history taking, and causual conversation  Cranial nerve II-XII: Pupils were equal round reactive to light extraocular movements were full, Visual field were full on confrontational test. Bilateral fundi were sharp.  Facial sensation and strength were normal. Hearing was intact to finger rubbing bilaterally. Uvula tongue midline.  head turning and shoulder shrug and were normal and symmetric.Tongue protrusion into cheek strength was normal.  Motor: normal tone, bulk and strength.  Sensory: Intact to fine touch, pinprick, preserved vibratory sensation, and proprioception at toes.  Coordination: Normal finger to nose, heel-to-shin bilaterally there was no truncal ataxia  Gait: Rising up from seated position without assistance, normal stance, without trunk ataxia, moderate stride, good arm swing, smooth turning, able to perform tiptoe, and heel, mild difficulty with tandem walking Romberg signs: Negative  Deep tendon reflexes: Brachioradialis 2/2, biceps 2/2, triceps 2/2, patellar 2/2, Achilles 2/2, plantar responses were flexor  bilaterally.   DIAGNOSTIC DATA (LABS, IMAGING, TESTING) - I reviewed patient records, labs, notes, testing and imaging myself where available.  Lab Results  Component Value Date   WBC 3.1* 02/25/2014   HGB 13.1 02/25/2014   HCT 37.6 02/25/2014   MCV 86.8 02/25/2014   PLT 173 02/25/2014      Component Value Date/Time   NA 137 02/25/2014 1224   K 3.7 02/25/2014 1224   CL 100 02/25/2014 1224   CO2 30 02/25/2014 1224   GLUCOSE 127* 02/25/2014 1224   BUN 21 02/25/2014 1224   CREATININE 0.87 02/25/2014 1224   CREATININE 0.7 02/25/2011 0929   CALCIUM 9.5 02/25/2014 1224   PROT 6.8 02/25/2014 1224   ALBUMIN 4.2 02/25/2014 1224   AST 28 02/25/2014 1224   ALT 24 02/25/2014 1224   ALKPHOS 55 02/25/2014 1224   BILITOT 0.3 02/25/2014 1224   GFRNONAA 87.96 01/12/2010 1519   GFRAA  06/26/2008 0820    >60        The eGFR has been calculated using the MDRD equation. This calculation has not been validated in all clinical  Lab Results  Component Value Date   CHOL 208* 02/25/2014   HDL 63 02/25/2014   LDLCALC 116* 02/25/2014   LDLDIRECT 121.7 02/25/2011   TRIG 147 02/25/2014   CHOLHDL 3.3 02/25/2014   No results found for: HGBA1C Lab Results  Component Value Date   VITAMINB12 387 05/10/2008   Lab Results  Component Value Date   TSH 0.469 02/25/2014      ASSESSMENT AND PLAN   Ana Russell is a 59 years old right-handed Caucasian female, with a long-standing history of relapsing remitting multiple sclerosis, overall she is stable clinically, last repeat MRI of the brain January 2015 showed no significant change, she does not want to have frequent repeat MRI,  Continue moderate exercise Return to clinic in one year Keep rebif treatment   Marcial Pacas, M.D. Ph.D.  [facility] [address] [place], [place] [address] [phone]

## 2014-09-20 ENCOUNTER — Ambulatory Visit: Payer: 59 | Admitting: Neurology

## 2014-09-20 ENCOUNTER — Ambulatory Visit: Payer: 59 | Admitting: Nurse Practitioner

## 2014-10-10 ENCOUNTER — Telehealth: Payer: Self-pay | Admitting: *Deleted

## 2014-10-10 NOTE — Telephone Encounter (Signed)
[facility] - 09/18/14 Normal CBC Diff Normal CMP Creatinine 0.85 LDL 135 TSH 1.44 Vit D 38.3 AIC 5.4

## 2014-10-10 NOTE — Telephone Encounter (Signed)
See previous phone note for labs.

## 2014-10-18 ENCOUNTER — Other Ambulatory Visit: Payer: Self-pay | Admitting: Neurology

## 2015-02-27 ENCOUNTER — Ambulatory Visit (INDEPENDENT_AMBULATORY_CARE_PROVIDER_SITE_OTHER): Payer: 59 | Admitting: Gynecology

## 2015-02-27 ENCOUNTER — Encounter: Payer: Self-pay | Admitting: Gynecology

## 2015-02-27 VITALS — BP 124/74 | Ht 66.0 in | Wt 115.0 lb

## 2015-02-27 DIAGNOSIS — N952 Postmenopausal atrophic vaginitis: Secondary | ICD-10-CM | POA: Diagnosis not present

## 2015-02-27 DIAGNOSIS — M810 Age-related osteoporosis without current pathological fracture: Secondary | ICD-10-CM | POA: Diagnosis not present

## 2015-02-27 DIAGNOSIS — Z01419 Encounter for gynecological examination (general) (routine) without abnormal findings: Secondary | ICD-10-CM

## 2015-02-27 LAB — CBC WITH DIFFERENTIAL/PLATELET
BASOS ABS: 0 10*3/uL (ref 0.0–0.1)
Basophils Relative: 0 % (ref 0–1)
EOS PCT: 1 % (ref 0–5)
Eosinophils Absolute: 0 10*3/uL (ref 0.0–0.7)
HCT: 37.7 % (ref 36.0–46.0)
Hemoglobin: 13 g/dL (ref 12.0–15.0)
Lymphocytes Relative: 30 % (ref 12–46)
Lymphs Abs: 1.1 10*3/uL (ref 0.7–4.0)
MCH: 30.6 pg (ref 26.0–34.0)
MCHC: 34.5 g/dL (ref 30.0–36.0)
MCV: 88.7 fL (ref 78.0–100.0)
MONO ABS: 0.6 10*3/uL (ref 0.1–1.0)
MPV: 10.6 fL (ref 8.6–12.4)
Monocytes Relative: 16 % — ABNORMAL HIGH (ref 3–12)
Neutro Abs: 1.9 10*3/uL (ref 1.7–7.7)
Neutrophils Relative %: 53 % (ref 43–77)
PLATELETS: 150 10*3/uL (ref 150–400)
RBC: 4.25 MIL/uL (ref 3.87–5.11)
RDW: 13.7 % (ref 11.5–15.5)
WBC: 3.6 10*3/uL — ABNORMAL LOW (ref 4.0–10.5)

## 2015-02-27 LAB — COMPREHENSIVE METABOLIC PANEL
ALBUMIN: 4.1 g/dL (ref 3.5–5.2)
ALK PHOS: 60 U/L (ref 39–117)
ALT: 26 U/L (ref 0–35)
AST: 31 U/L (ref 0–37)
BUN: 14 mg/dL (ref 6–23)
CO2: 27 mEq/L (ref 19–32)
Calcium: 9.2 mg/dL (ref 8.4–10.5)
Chloride: 105 mEq/L (ref 96–112)
Creat: 0.83 mg/dL (ref 0.50–1.10)
GLUCOSE: 85 mg/dL (ref 70–99)
POTASSIUM: 3.7 meq/L (ref 3.5–5.3)
Sodium: 140 mEq/L (ref 135–145)
Total Bilirubin: 0.4 mg/dL (ref 0.2–1.2)
Total Protein: 7 g/dL (ref 6.0–8.3)

## 2015-02-27 LAB — LIPID PANEL
Cholesterol: 206 mg/dL — ABNORMAL HIGH (ref 0–200)
HDL: 76 mg/dL (ref >=46)
LDL Cholesterol: 106 mg/dL — ABNORMAL HIGH (ref 0–99)
Total CHOL/HDL Ratio: 2.7 Ratio
Triglycerides: 119 mg/dL (ref <150)
VLDL: 24 mg/dL (ref 0–40)

## 2015-02-27 LAB — TSH: TSH: 0.217 u[IU]/mL — AB (ref 0.350–4.500)

## 2015-02-27 NOTE — Patient Instructions (Signed)
Follow up for the bone density as scheduled.  Call to Schedule your mammogram  Facilities in [place]: 1)  [facility], [address] Phone: [phone] 2)  [facility]. [address] [place] Phone: [phone] 3)  Dr. Isaiah Blakes at [facility] [address] Phone: [phone]     Mammogram A mammogram is an X-ray test to find changes in a woman's breast. You should get a mammogram if:  You are 75 years of age or older  You have risk factors.   Your doctor recommends that you have one.  BEFORE THE TEST  Do not schedule the test the week before your period, especially if your breasts are sore during this time.  On the day of your mammogram:  Wash your breasts and armpits well. After washing, do not put on any deodorant or talcum powder on until after your test.   Eat and drink as you usually do.   Take your medicines as usual.   If you are diabetic and take insulin, make sure you:   Eat before coming for your test.   Take your insulin as usual.   If you cannot keep your appointment, call before the appointment to cancel. Schedule another appointment.  TEST  You will need to undress from the waist up. You will put on a hospital gown.   Your breast will be put on the mammogram machine, and it will press firmly on your breast with a piece of plastic called a compression paddle. This will make your breast flatter so that the machine can X-ray all parts of your breast.   Both breasts will be X-rayed. Each breast will be X-rayed from above and from the side. An X-ray might need to be taken again if the picture is not good enough.   The mammogram will last about 15 to 30 minutes.  AFTER THE TEST Finding out the results of your test Ask when your test results will be ready. Make sure you get your test results.  Document Released: 11/19/2008 Document Revised: 08/12/2011  Document Reviewed: 11/19/2008 [facility] Patient Information 2012 [place].  You may obtain a copy of any labs that were done today by logging onto MyChart as outlined in the instructions provided with your AVS (after visit summary). The office will not call with normal lab results but certainly if there are any significant abnormalities then we will contact you.   Health Maintenance, Female A healthy lifestyle and preventative care can promote health and wellness.  Maintain regular health, dental, and eye exams.  Eat a healthy diet. Foods like vegetables, fruits, whole grains, low-fat dairy products, and lean protein foods contain the nutrients you need without too many calories. Decrease your intake of foods high in solid fats, added sugars, and salt. Get information about a proper diet from your caregiver, if necessary.  Regular physical exercise is one of the most important things you can do for your health. Most adults should get at least 150 minutes of moderate-intensity exercise (any activity that increases your heart rate and causes you to sweat) each week. In addition, most adults need muscle-strengthening exercises on 2 or more days a week.   Maintain a healthy weight. The body mass index (BMI) is a screening tool to identify possible weight problems. It provides an estimate of body fat based on height and weight. Your caregiver can help determine your BMI, and can help  you achieve or maintain a healthy weight. For adults 20 years and older:  A BMI below 18.5 is considered underweight.  A BMI of 18.5 to 24.9 is normal.  A BMI of 25 to 29.9 is considered overweight.  A BMI of 30 and above is considered obese.  Maintain normal blood lipids and cholesterol by exercising and minimizing your intake of saturated fat. Eat a balanced diet with plenty of fruits and vegetables. Blood tests for lipids and cholesterol should begin at age 20 and be repeated every 5 years. If your lipid or  cholesterol levels are high, you are over 50, or you are a high risk for heart disease, you may need your cholesterol levels checked more frequently.Ongoing high lipid and cholesterol levels should be treated with medicines if diet and exercise are not effective.  If you smoke, find out from your caregiver how to quit. If you do not use tobacco, do not start.  Lung cancer screening is recommended for adults aged 55 80 years who are at high risk for developing lung cancer because of a history of smoking. Yearly low-dose computed tomography (CT) is recommended for people who have at least a 30-pack-year history of smoking and are a current smoker or have quit within the past 15 years. A pack year of smoking is smoking an average of 1 pack of cigarettes a day for 1 year (for example: 1 pack a day for 30 years or 2 packs a day for 15 years). Yearly screening should continue until the smoker has stopped smoking for at least 15 years. Yearly screening should also be stopped for people who develop a health problem that would prevent them from having lung cancer treatment.  If you are pregnant, do not drink alcohol. If you are breastfeeding, be very cautious about drinking alcohol. If you are not pregnant and choose to drink alcohol, do not exceed 1 drink per day. One drink is considered to be 12 ounces (355 mL) of beer, 5 ounces (148 mL) of wine, or 1.5 ounces (44 mL) of liquor.  Avoid use of street drugs. Do not share needles with anyone. Ask for help if you need support or instructions about stopping the use of drugs.  High blood pressure causes heart disease and increases the risk of stroke. Blood pressure should be checked at least every 1 to 2 years. Ongoing high blood pressure should be treated with medicines, if weight loss and exercise are not effective.  If you are 55 to 60 years old, ask your caregiver if you should take aspirin to prevent strokes.  Diabetes screening involves taking a blood sample  to check your fasting blood sugar level. This should be done once every 3 years, after age 45, if you are within normal weight and without risk factors for diabetes. Testing should be considered at a younger age or be carried out more frequently if you are overweight and have at least 1 risk factor for diabetes.  Breast cancer screening is essential preventative care for women. You should practice "breast self-awareness." This means understanding the normal appearance and feel of your breasts and may include breast self-examination. Any changes detected, no matter how small, should be reported to a caregiver. Women in their 20s and 30s should have a clinical breast exam (CBE) by a caregiver as part of a regular health exam every 1 to 3 years. After age 40, women should have a CBE every year. Starting at age 40, women should consider having a   mammogram (breast X-ray) every year. Women who have a family history of breast cancer should talk to their caregiver about genetic screening. Women at a high risk of breast cancer should talk to their caregiver about having an MRI and a mammogram every year.  Breast cancer gene (BRCA)-related cancer risk assessment is recommended for women who have family members with BRCA-related cancers. BRCA-related cancers include breast, ovarian, tubal, and peritoneal cancers. Having family members with these cancers may be associated with an increased risk for harmful changes (mutations) in the breast cancer genes BRCA1 and BRCA2. Results of the assessment will determine the need for genetic counseling and BRCA1 and BRCA2 testing.  The Pap test is a screening test for cervical cancer. Women should have a Pap test starting at age 48. Between ages 6 and 93, Pap tests should be repeated every 2 years. Beginning at age 65, you should have a Pap test every 3 years as long as the past 3 Pap tests have been normal. If you had a hysterectomy for a problem that was not cancer or a condition  that could lead to cancer, then you no longer need Pap tests. If you are between ages 68 and 41, and you have had normal Pap tests going back 10 years, you no longer need Pap tests. If you have had past treatment for cervical cancer or a condition that could lead to cancer, you need Pap tests and screening for cancer for at least 20 years after your treatment. If Pap tests have been discontinued, risk factors (such as a new sexual partner) need to be reassessed to determine if screening should be resumed. Some women have medical problems that increase the chance of getting cervical cancer. In these cases, your caregiver may recommend more frequent screening and Pap tests.  The human papillomavirus (HPV) test is an additional test that may be used for cervical cancer screening. The HPV test looks for the virus that can cause the cell changes on the cervix. The cells collected during the Pap test can be tested for HPV. The HPV test could be used to screen women aged 53 years and older, and should be used in women of any age who have unclear Pap test results. After the age of 48, women should have HPV testing at the same frequency as a Pap test.  Colorectal cancer can be detected and often prevented. Most routine colorectal cancer screening begins at the age of 70 and continues through age 75. However, your caregiver may recommend screening at an earlier age if you have risk factors for colon cancer. On a yearly basis, your caregiver may provide home test kits to check for hidden blood in the stool. Use of a small camera at the end of a tube, to directly examine the colon (sigmoidoscopy or colonoscopy), can detect the earliest forms of colorectal cancer. Talk to your caregiver about this at age 19, when routine screening begins. Direct examination of the colon should be repeated every 5 to 10 years through age 42, unless early forms of pre-cancerous polyps or small growths are found.  Hepatitis C blood testing is  recommended for all people born from [birth year] through 1966 and any individual with known risks for hepatitis C.  Practice safe sex. Use condoms and avoid high-risk sexual practices to reduce the spread of sexually transmitted infections (STIs). Sexually active women aged 33 and younger should be checked for Chlamydia, which is a common sexually transmitted infection. Older women with new or multiple  partners should also be tested for Chlamydia. Testing for other STIs is recommended if you are sexually active and at increased risk.  Osteoporosis is a disease in which the bones lose minerals and strength with aging. This can result in serious bone fractures. The risk of osteoporosis can be identified using a bone density scan. Women ages 65 and over and women at risk for fractures or osteoporosis should discuss screening with their caregivers. Ask your caregiver whether you should be taking a calcium supplement or vitamin D to reduce the rate of osteoporosis.  Menopause can be associated with physical symptoms and risks. Hormone replacement therapy is available to decrease symptoms and risks. You should talk to your caregiver about whether hormone replacement therapy is right for you.  Use sunscreen. Apply sunscreen liberally and repeatedly throughout the day. You should seek shade when your shadow is shorter than you. Protect yourself by wearing long sleeves, pants, a wide-brimmed hat, and sunglasses year round, whenever you are outdoors.  Notify your caregiver of new moles or changes in moles, especially if there is a change in shape or color. Also notify your caregiver if a mole is larger than the size of a pencil eraser.  Stay current with your immunizations. Document Released: 03/08/2011 Document Revised: 12/18/2012 Document Reviewed: 03/08/2011 [employer] Patient Information 2014 [employer].    

## 2015-02-27 NOTE — Progress Notes (Signed)
Ana Russell Jul 24, 1956 [id number]        59 y.o.  G2P0020 for annual exam.  Several issues noted below.  Past medical history,surgical history, problem list, medications, allergies, family history and social history were all reviewed and documented as reviewed in the EPIC chart.  ROS:  Performed with pertinent positives and negatives included in the history, assessment and plan.   Additional significant findings :  none   Exam: Ana Russell Vitals:   02/27/15 1120  BP: 124/74  Height: 5\' 6"  (1.676 m)  Weight: 115 lb (52.164 kg)   General appearance:  Normal affect, orientation and appearance. Skin: Grossly normal HEENT: Without gross lesions.  No cervical or supraclavicular adenopathy. Thyroid normal.  Lungs:  Clear without wheezing, rales or rhonchi Cardiac: RR, without RMG Abdominal:  Soft, nontender, without masses, guarding, rebound, organomegaly or hernia Breasts:  Examined lying and sitting without masses, retractions, discharge or axillary adenopathy. Pelvic:  Ext/BUS/vagina with atrophic changes  Cervix with atrophic changes  Uterus anteverted, normal size, shape and contour, midline and mobile nontender   Adnexa  Without masses or tenderness    Anus and perineum  Normal   Rectovaginal  Normal sphincter tone without palpated masses or tenderness.    Assessment/Plan:  59 y.o. G81P0020 female for annual exam.   1. Postmenopausal/atrophic genital changes. Patient without significant symptoms of hot flushes, night sweats, vaginal dryness or any vaginal bleeding. Continue to monitor and report any vaginal bleeding. 2. Osteoporosis. DEXA 2013 T score -2.5.  I have discussed on multiple occasions treatment options and the patient has repeatedly refused medication treatment as evidenced in my prior 2015 and 2014 notes. She will repeat the bone density this year and I went ahead and scheduled it.  At this point she again refuses treatment. 3. Mammography 2012. I again  emphasized breast cancer is the most common cancer in women and that early detection leased a better prognosis and I strongly recommend she schedule her mammography as I have repeatedly in the past. The patient again acknowledges my recommendation is not going to do this at this time. SBE monthly reviewed.  4. Pap smear/HPV negative 2015. No Pap smear done today. No history of significant abnormal Pap smears previously. 5. Colonoscopy 2013. Repeat at their recommended interval. 6. Health maintenance. Patient had routine blood work done in January by her primary physician but asked if I would repeat it. She is working on diet to address her cholesterol and glucose. Also is taking extra vitamin D with a prior vitamin D level in the 30s. Check baseline CBC, comprehensive metabolic panel, lipid profile, urinalysis, TSH and vitamin D. Follow up for bone density as scheduled otherwise annual exam in one year.   Anastasio Auerbach MD, 11:43 AM 02/27/2015

## 2015-02-28 LAB — URINALYSIS W MICROSCOPIC + REFLEX CULTURE
Bilirubin Urine: NEGATIVE
CASTS: NONE SEEN
CRYSTALS: NONE SEEN
Glucose, UA: NEGATIVE mg/dL
HGB URINE DIPSTICK: NEGATIVE
Ketones, ur: NEGATIVE mg/dL
NITRITE: POSITIVE — AB
Protein, ur: NEGATIVE mg/dL
SPECIFIC GRAVITY, URINE: 1.011 (ref 1.005–1.030)
SQUAMOUS EPITHELIAL / LPF: NONE SEEN
Urobilinogen, UA: 0.2 mg/dL (ref 0.0–1.0)
pH: 6.5 (ref 5.0–8.0)

## 2015-02-28 LAB — VITAMIN D 25 HYDROXY (VIT D DEFICIENCY, FRACTURES): Vit D, 25-Hydroxy: 52 ng/mL (ref 30–100)

## 2015-03-02 LAB — URINE CULTURE

## 2015-03-03 ENCOUNTER — Other Ambulatory Visit: Payer: Self-pay | Admitting: Gynecology

## 2015-03-03 MED ORDER — SULFAMETHOXAZOLE-TRIMETHOPRIM 800-160 MG PO TABS: 1.0000 | ORAL_TABLET | Freq: Two times a day (BID) | ORAL | Status: DC

## 2015-05-08 DIAGNOSIS — M810 Age-related osteoporosis without current pathological fracture: Secondary | ICD-10-CM

## 2015-05-08 HISTORY — DX: Age-related osteoporosis without current pathological fracture: M81.0

## 2015-05-27 ENCOUNTER — Encounter: Payer: Self-pay | Admitting: Gynecology

## 2015-05-27 ENCOUNTER — Telehealth: Payer: Self-pay | Admitting: Gynecology

## 2015-05-27 ENCOUNTER — Ambulatory Visit (INDEPENDENT_AMBULATORY_CARE_PROVIDER_SITE_OTHER): Payer: 59

## 2015-05-27 DIAGNOSIS — M810 Age-related osteoporosis without current pathological fracture: Secondary | ICD-10-CM

## 2015-05-27 NOTE — Telephone Encounter (Signed)
Tell patient that her bone density continues to show osteoporosis. It is stable from her prior bone density. We've talked about treatment options and she has declined in the past. If she has any interest in discussing this and recommend office visit. Otherwise recommend repeat DEXA in 2 years.

## 2015-05-28 NOTE — Telephone Encounter (Signed)
Pt informed with the below note. 

## 2015-09-25 ENCOUNTER — Encounter: Payer: Self-pay | Admitting: Neurology

## 2015-09-25 ENCOUNTER — Ambulatory Visit (INDEPENDENT_AMBULATORY_CARE_PROVIDER_SITE_OTHER): Payer: 59 | Admitting: Neurology

## 2015-09-25 VITALS — BP 123/84 | HR 91 | Ht 66.0 in | Wt 118.0 lb

## 2015-09-25 DIAGNOSIS — G35 Multiple sclerosis: Secondary | ICD-10-CM | POA: Diagnosis not present

## 2015-09-25 NOTE — Progress Notes (Signed)
[facility]  PATIENT: Ana Russell DOB: [birth year]  HISTORICAL Ana Russell is a 60 years old right-handed Caucasian female, came in to followup for relapsing remitting multiple sclerosis.  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  She only has mild gait difficulty, taking her Rebif on Monday, Wednesday, Friday, premedicated with Advil, overall tolerating the medication very well  Most recent MRI of the brain was 2012, had a repeat scan in January 20/15 his, she came in to review her MRI findings, we have seen the films together, with continued evidence of multiple supratentorium periventricular oval-shaped lesion, there was T1 black holes, no contrast enhancement,   After discussed with patient, she is overall doing well clinically fairly stable imaging-wise, she wants to stay on current treatment,  She reported a history of mildly decreased white count, has been under close supervision of her primary care physician Dr. Noah Delaine at [facility], I do not have recent laboratory evaluations  UPDATE Jan 14th 2016:  She is doing very well, enjoys her painting, last repeat MRI January 2015, MRI scans of brain showing extensive periventricular and subcortical white matter hyperintensities consistent with multiple sclerosis. No enhancing lesions are noted. Presence of multiple T1 black holes indicates chronic disease. 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 I have reviewed MRI of the brain with patient, there was no  significant changes compared to previous scans.  She is continuing rebif Monday Wednesday Friday, premedicated with Advil,  She has mild unsteady gait, using walking sticks, occasionally   Most recent laboratory showed mild elevated glucose 127, normal liver functional test, mild elevated LDL 116  UPDATE Sep 25 2015: She walks everyday 2 miles, weight training,  She is doing pull up, She walks with a stick, she has urinary urgency, mild constipation.   She noticed mild left ear tinnitus with mild left hearing loss.  Then she got right hearing loss. She still taking Rebif, tolerating well,  I reviewed laboratory in December 2016, elevated LDL 125, HDL was 73, mild elevated TSH 5.76, vitamin D was normal 50 1.7, A1c was mild elevated 6.0  REVIEW OF SYSTEMS: Full 14 system review of systems performed and notable only for mild gait difficulty  ALLERGIES: No Known Allergies  HOME MEDICATIONS: Outpatient Prescriptions Prior to Visit  Medication Sig Dispense Refill  . Cholecalciferol (VITAMIN D PO) Take by mouth.      . REBIF 44 MCG/0.5ML SOSY injection Inject subcutaneously 83mg three times a week 12 Syringe 11  . SYNTHROID 100 MCG tablet 100 mg daily.  0  . sulfamethoxazole-trimethoprim (BACTRIM DS,SEPTRA DS) 800-160 MG per tablet Take 1 tablet by mouth 2 (two) times daily. 6 tablet 0   No facility-administered medications prior to visit.    PAST MEDICAL HISTORY: Past Medical History  Diagnosis Date  . Endometrial hyperplasia without atypia, simple 09/2003    NORMAL ENDOMETRIAL BIOPSY 03/2005  . Hypothyroidism   . Osteoporosis 05/2015    T score -2.9 left femoral neck stable from prior DEXA 2013  . [place]  .  Fluid retention     in ears  . C. difficile colitis 2013    PAST SURGICAL HISTORY: Past Surgical History  Procedure Laterality Date  . Dilation and curettage of uterus      TAB  . Hysteroscopy  09/2003    POLYPS  . Thyroidectomy,  partial    . Knee surgery  2011    bilateral knee scopes  . Pelvic laparoscopy      FAMILY HISTORY: Family History  Problem Relation Age of Onset  . Diabetes Mother   . Diabetes Paternal Grandfather   . Cancer Sister     Thyroid    SOCIAL HISTORY:  Social History   Social History  . Marital Status: Married    Spouse Name: Ed  . Number of Children: 0  . Years of Education: Bachelors   Occupational History  . Not on file.   Social History Main Topics  . Smoking status: Former Research scientist (life sciences)  . Smokeless tobacco: Never Used     Comment: Quit 8 years ago.  . Alcohol Use: 0.0 oz/week    0 Standard drinks or equivalent per week     Comment: TUES THURS SAT SUN WINE  . Drug Use: No  . Sexual Activity: Yes    Birth Control/ Protection: Post-menopausal     Comment: 1st intercourse 71 yo-5 partners   Other Topics Concern  . Not on file   Social History Narrative   Patient lives at home with husband (Ed)   Patient is right handed   Education level Bachelors degree   Caffeine consumption is 3 cups daily    PHYSICAL EXAM   Filed Vitals:   09/25/15 1332  Height: 5' 6" (1.676 m)  Weight: 118 lb (53.524 kg)    Not recorded      Body mass index is 19.05 kg/(m^2).   Generalized: In no acute distress  Neck: Supple, no carotid bruits   Cardiac: Regular rate rhythm  Pulmonary: Clear to auscultation bilaterally  Musculoskeletal: No deformity  Neurological examination  Mentation: Alert oriented to time, place, history taking, and causual conversation  Cranial nerve II-XII: Pupils were equal round reactive to light extraocular movements were full, Visual field were full on confrontational test. Bilateral fundi were sharp.  Facial sensation and strength were normal. Hearing was intact to finger rubbing bilaterally. Uvula tongue midline.  head turning and shoulder shrug and were normal and symmetric.Tongue protrusion into cheek strength was normal.  Motor: normal tone,  bulk and strength.  Sensory: Intact to fine touch, pinprick, preserved vibratory sensation, and proprioception at toes.  Coordination: Normal finger to nose, heel-to-shin bilaterally there was no truncal ataxia  Gait: Rising up from seated position without assistance, mildly wide-based, cautious, without trunk ataxia, moderate stride, good arm swing, smooth turning, able to perform tiptoe, and heel, mild difficulty with tandem walking Romberg signs: Negative  Deep tendon reflexes: Brachioradialis 2/2, biceps 2/2, triceps 2/2, patellar 2/2, Achilles 2/2, plantar responses were flexor bilaterally.   DIAGNOSTIC DATA (LABS, IMAGING, TESTING) - I reviewed patient records, labs, notes, testing and imaging myself where available.  Lab Results  Component Value Date   WBC 3.6* 02/27/2015   HGB 13.0 02/27/2015   HCT 37.7 02/27/2015   MCV 88.7 02/27/2015   PLT 150 02/27/2015      Component Value Date/Time   NA 140 02/27/2015 1146   K 3.7 02/27/2015 1146   CL 105 02/27/2015 1146   CO2 27 02/27/2015 1146   GLUCOSE 85 02/27/2015 1146  BUN 14 02/27/2015 1146   CREATININE 0.83 02/27/2015 1146   CREATININE 0.7 02/25/2011 0929   CALCIUM 9.2 02/27/2015 1146   PROT 7.0 02/27/2015 1146   ALBUMIN 4.1 02/27/2015 1146   AST 31 02/27/2015 1146   ALT 26 02/27/2015 1146   ALKPHOS 60 02/27/2015 1146   BILITOT 0.4 02/27/2015 1146   GFRNONAA 87.96 01/12/2010 1519   GFRAA  06/26/2008 0820    >60        The eGFR has been calculated using the MDRD equation. This calculation has not been validated in all clinical   Lab Results  Component Value Date   CHOL 206* 02/27/2015   HDL 76 02/27/2015   LDLCALC 106* 02/27/2015   LDLDIRECT 121.7 02/25/2011   TRIG 119 02/27/2015   CHOLHDL 2.7 02/27/2015   No results found for: HGBA1C Lab Results  Component Value Date   VITAMINB12 387 05/10/2008   Lab Results  Component Value Date   TSH 0.217* 02/27/2015      ASSESSMENT AND PLAN   Mrs. Ovid Curd  is a 60 years old right-handed Caucasian female, with a long-standing history of relapsing remitting multiple sclerosis, overall she is stable clinically, last repeat MRI of the brain January 2015 showed no significant change, she does not want to have repeat MRI now,  Continue Rebif Continue moderate exercise Return to clinic in one year with NP     Marcial Pacas, M.D. Ph.D.  [facility] [address], [place], [place] [address] [phone]

## 2015-09-30 ENCOUNTER — Other Ambulatory Visit: Payer: Self-pay | Admitting: *Deleted

## 2015-09-30 MED ORDER — INTERFERON BETA-1A 44 MCG/0.5ML ~~LOC~~ SOSY: PREFILLED_SYRINGE | SUBCUTANEOUS | Status: DC

## 2015-10-14 ENCOUNTER — Encounter: Payer: Self-pay | Admitting: Gynecology

## 2015-10-14 ENCOUNTER — Ambulatory Visit (INDEPENDENT_AMBULATORY_CARE_PROVIDER_SITE_OTHER): Payer: 59 | Admitting: Gynecology

## 2015-10-14 VITALS — BP 124/76

## 2015-10-14 DIAGNOSIS — N816 Rectocele: Secondary | ICD-10-CM

## 2015-10-14 NOTE — Progress Notes (Signed)
Ana Russell 1956/07/23 [id number]        60 y.o.  G2P0020 Presents complaining of pressure symptoms feeling something is coming out of her vagina. Wonders whether she is having prolapse. Recently tried applicator with Replens and felt like she was hitting a wall.  Most recent exam 02/2015 showed atrophic changes but otherwise normal.  Past medical history,surgical history, problem list, medications, allergies, family history and social history were all reviewed and documented in the EPIC chart.  Directed ROS with pertinent positives and negatives documented in the history of present illness/assessment and plan.  Exam: Caryn Bee assistant Filed Vitals:   10/14/15 1225  BP: 124/76   General appearance:  Normal Abdomen soft nontender without masses guarding rebound Pelvic external BUS vagina with ridgelike banding several finger breadths within the vagina. Mild rectocele below this. Difficult to open the speculum above this ridgelike area to visualize the cervix. Bimanual exam limited by voluntary guarding without gross masses or tenderness. Rectal exam is normal excepting mild rectocele.  Assessment/Plan:  60 y.o. G2P0020 with ridgelike banding across vagina. No mass palpated. Not appreciated on prior pelvic exams. Patient has noted she started exercising but nothing else. We'll start with ultrasound for better pelvic assessment. Will follow up at that time.    Anastasio Auerbach MD, 12:50 PM 10/14/2015

## 2015-10-14 NOTE — Patient Instructions (Signed)
Follow up for ultrasound as scheduled 

## 2015-10-28 ENCOUNTER — Telehealth: Payer: Self-pay | Admitting: Neurology

## 2015-10-28 NOTE — Telephone Encounter (Signed)
Patient aware - she has already tried Metamucil and it was only minimally helpful.  She is going to try Colace.  She will call back with any further concerns.

## 2015-10-28 NOTE — Telephone Encounter (Signed)
Ana Russell, please call patient, she may try over-the-counter Metamucil or Colace

## 2015-10-28 NOTE — Telephone Encounter (Signed)
Patient had mentioned to Dr. Krista Blue that she had terrible constipation and her diet is not helping her with that.  Could Dr. Krista Blue recommend a medication/solution for her as she has MS and feels it might be muscle related.  Please call.  Thanks!

## 2015-11-05 ENCOUNTER — Encounter: Payer: Self-pay | Admitting: Gynecology

## 2015-11-05 ENCOUNTER — Ambulatory Visit (INDEPENDENT_AMBULATORY_CARE_PROVIDER_SITE_OTHER): Payer: 59 | Admitting: Gynecology

## 2015-11-05 ENCOUNTER — Other Ambulatory Visit: Payer: Self-pay | Admitting: Gynecology

## 2015-11-05 ENCOUNTER — Ambulatory Visit (INDEPENDENT_AMBULATORY_CARE_PROVIDER_SITE_OTHER): Payer: 59

## 2015-11-05 VITALS — BP 120/70

## 2015-11-05 DIAGNOSIS — D251 Intramural leiomyoma of uterus: Secondary | ICD-10-CM

## 2015-11-05 DIAGNOSIS — N816 Rectocele: Secondary | ICD-10-CM

## 2015-11-05 DIAGNOSIS — D252 Subserosal leiomyoma of uterus: Secondary | ICD-10-CM

## 2015-11-05 DIAGNOSIS — R339 Retention of urine, unspecified: Secondary | ICD-10-CM

## 2015-11-05 DIAGNOSIS — N83319 Acquired atrophy of ovary, unspecified side: Secondary | ICD-10-CM

## 2015-11-05 NOTE — Progress Notes (Addendum)
Ana Russell Sep 28, 1955 [id number]        60 y.o.  G2P0020 Presents for follow up ultrasound. Patient has history of filling pressure and having difficulty placing the Replens applicator. Exam showed a ridgelike area along the posterior aspect of the vagina. She was asked to return for ultrasound to rule out nonpalpable abnormalities.  Past medical history,surgical history, problem list, medications, allergies, family history and social history were all reviewed and documented in the EPIC chart.  Directed ROS with pertinent positives and negatives documented in the history of present illness/assessment and plan.  Exam: [place] assistant Filed Vitals:   11/05/15 1503  BP: 120/70   General appearance:  Normal Abdomen soft nontender without masses guarding rebound Pelvic external BUS vagina with ridgelike area posterior transverse vagina. Cervix grossly normal. Uterus retroverted grossly normal size midline mobile nontender. Adnexa without masses or tenderness  Ultrasound shows uterus normal size with several small myomas measuring 26 mm, 25 mm, 21 mm, 21 mm. Endometrial echo 2.4 mm.  Right and left ovaries grossly normal.  Cul-de-sac negative.  Patient voided several times but still had a substantial post void residual noted on ultrasound.  Patient underwent urinary catheterization under sterile technique with drainage of approximately 350 cc of clear yellow urine. Sample sent for culture. Examination afterwards showed resolution of the posterior transverse vaginal ridge. A rectal exam was also performed which showed no masses or abnormalities.  I think this area was do to urinary retention which distorted the vaginal mucosa and presented a functional obstruction which was resolved with drainage of her bladder.   Assessment/Plan:  60 y.o. G2P0020 with urinary retention a patient with MS. I think this accounts for her functional obstruction when trying to place the Replens catheter. I  recommended she follow up with her MS physician for further evaluation and management in reference to her retention. Possible referral to urology if needed.    Anastasio Auerbach MD, 3:27 PM 11/05/2015

## 2015-11-05 NOTE — Addendum Note (Signed)
Addended by: Nelva Nay on: 11/05/2015 04:21 PM   Modules accepted: Orders

## 2015-11-05 NOTE — Patient Instructions (Signed)
Follow up with your MS physician in reference to urinary retention.

## 2015-11-06 ENCOUNTER — Other Ambulatory Visit: Payer: Self-pay | Admitting: *Deleted

## 2015-11-06 DIAGNOSIS — R339 Retention of urine, unspecified: Secondary | ICD-10-CM

## 2015-11-06 DIAGNOSIS — G35 Multiple sclerosis: Secondary | ICD-10-CM

## 2015-11-06 LAB — URINE CULTURE
Colony Count: NO GROWTH
ORGANISM ID, BACTERIA: NO GROWTH

## 2015-11-06 NOTE — Telephone Encounter (Signed)
Pt called [facility] she thought before she was constipated but she doesn't think it was that. Said she is not emptying her bladder. She had appt with GYN yesterday, she was catheterized and was told it wasn't constipation, the bladder is full and in the way. Is there a way to control not emptying the bladder. [phone]

## 2015-11-06 NOTE — Telephone Encounter (Signed)
Ok per Dr. Krista Blue to place a referral for urology.  Patient agreeable to this plan.

## 2015-11-13 ENCOUNTER — Telehealth: Payer: Self-pay | Admitting: Neurology

## 2015-11-13 NOTE — Telephone Encounter (Signed)
She decline the MRI brain w/wo at her last office visit.  She is considering having a MRI of her ear.  If so, she would like to try to get both at the same time.  She would like Dr. Krista Blue to order the brain and her ENT would order the ear.  She will call back when she makes her decision.

## 2015-11-13 NOTE — Telephone Encounter (Signed)
Pt called and is willing to have an MRI. She is stating that her ENT physician, Dr. Malachy Moan, is requesting an MRI of her ear as well. Is it possible to have this included on one MRI order? Please call and advise [phone]

## 2015-11-17 NOTE — Telephone Encounter (Signed)
Patient called back to request that Dr. Krista Blue order MRI of brain with and without contrast, wants to have done at the same time as ENT MRI of ear, to be done at [facility].

## 2015-11-19 ENCOUNTER — Other Ambulatory Visit: Payer: Self-pay | Admitting: Otolaryngology

## 2015-11-19 DIAGNOSIS — H90A22 Sensorineural hearing loss, unilateral, left ear, with restricted hearing on the contralateral side: Secondary | ICD-10-CM

## 2015-11-19 NOTE — Telephone Encounter (Addendum)
Michelle: Please call patient, I have talked with [facility] imaging, concerning her November 25 2015 MRI scan,  She has MRI inner ear with without contrast order pending by her ENT Dr. Hermina Barters, MRI of the inner ear will cover most of what we need to see for her MS,  The technician also made a notation, will add on sagittal FLAIR with contrast, which is specific for MS  She does not need to have separate MRI of the brain with without contrast.

## 2015-11-19 NOTE — Telephone Encounter (Signed)
Spoke to patient - aware of information below.

## 2015-11-25 ENCOUNTER — Ambulatory Visit
Admission: RE | Admit: 2015-11-25 | Discharge: 2015-11-25 | Disposition: A | Discharge status: Home / Self Care | Payer: 59 | Source: Ambulatory Visit | Attending: Otolaryngology | Admitting: Otolaryngology

## 2015-11-25 DIAGNOSIS — H90A22 Sensorineural hearing loss, unilateral, left ear, with restricted hearing on the contralateral side: Secondary | ICD-10-CM

## 2015-11-25 MED ORDER — GADOBENATE DIMEGLUMINE 529 MG/ML IV SOLN
10.0000 mL | Freq: Once | INTRAVENOUS | Status: AC | PRN
  Administered 2015-11-25: 10 mL via INTRAVENOUS

## 2015-12-02 ENCOUNTER — Telehealth: Payer: Self-pay | Admitting: Neurology

## 2015-12-02 NOTE — Telephone Encounter (Signed)
Patient called, wants to make sure that Dr. Krista Blue gets a copy of the MRI that was ordered by her ear doctor, Dr. Thornell Mule.

## 2015-12-02 NOTE — Telephone Encounter (Signed)
Spoke to patient - aware of results. 

## 2015-12-02 NOTE — Telephone Encounter (Signed)
Ana Russell, please call patient, I have reviewed MRI of the brain/inner ear with without contrast, compared to previous MRI in 2009, there was no significant change in her MS lesion load, no contrast enhancement

## 2016-08-24 ENCOUNTER — Other Ambulatory Visit: Payer: Self-pay | Admitting: *Deleted

## 2016-08-24 MED ORDER — INTERFERON BETA-1A 44 MCG/0.5ML ~~LOC~~ SOSY: PREFILLED_SYRINGE | SUBCUTANEOUS | 11 refills | Status: DC

## 2016-09-08 ENCOUNTER — Telehealth: Payer: Self-pay | Admitting: *Deleted

## 2016-09-08 NOTE — Telephone Encounter (Signed)
Rebif PA approved by [employer] [id number]) through 09/08/17 - pt [id number].

## 2016-09-30 ENCOUNTER — Encounter: Payer: Self-pay | Admitting: Nurse Practitioner

## 2016-09-30 ENCOUNTER — Ambulatory Visit (INDEPENDENT_AMBULATORY_CARE_PROVIDER_SITE_OTHER): Payer: 59 | Admitting: Nurse Practitioner

## 2016-09-30 VITALS — BP 130/80 | HR 82 | Ht 66.0 in | Wt 121.6 lb

## 2016-09-30 DIAGNOSIS — G35 Multiple sclerosis: Secondary | ICD-10-CM

## 2016-09-30 NOTE — Progress Notes (Signed)
I have read the note, and I agree with the clinical assessment and plan.  Kassius Battiste KEITH   

## 2016-09-30 NOTE — Progress Notes (Signed)
[facility]  PATIENT: Ana Russell DOB: 04/15/1956   REASON FOR VISIT: Follow-up for multiple sclerosis HISTORY FROM: Patient    HISTORY OF PRESENT ILLNESS:Ana Russell is a 61 years old right-handed Caucasian female, came in to followup for relapsing remitting multiple sclerosis. 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 She only has mild gait difficulty, taking her Rebif on Monday, Wednesday, Friday, premedicated with Advil, overall tolerating the medication very well  Most recent MRI of the brain was 2012, had a repeat scan in January 20/15 his, she came in to review her MRI findings, we have seen the films together, with continued evidence of multiple supratentorium periventricular oval-shaped lesion, there was T1 black holes, no contrast enhancement,   After discussed with patient, she is overall doing well clinically fairly stable imaging-wise, she wants to stay on current treatment, She reported a history of mildly decreased white count, has been under close supervision of her primary care physician Dr. Noah Delaine at [facility], I do not have recent laboratory evaluations  UPDATE Jan 14th 2016: She is doing very well, enjoys her painting, last repeat MRI January 2015, MRI scans of brain showing extensive periventricular and subcortical white matter hyperintensities consistent with multiple sclerosis. No enhancing lesions are noted. Presence of multiple T1 black holes indicates chronic disease. 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 I have reviewed MRI of the brain with patient, there was no significant changes compared to previous scans.  She is continuing rebif Monday Wednesday Friday, premedicated with Advil, She has mild unsteady gait, using walking sticks, occasionally  Most recent laboratory showed mild elevated glucose 127, normal liver functional test, mild elevated LDL 116  UPDATE Sep 25 2015:She walks everyday 2 miles, weight training,  She is doing pull up, She walks with a stick, she has urinary urgency, mild constipation.   She noticed mild left ear tinnitus with mild left hearing loss.  Then she got right hearing loss. She still taking Rebif, tolerating well,  I reviewed laboratory in December 2016, elevated LDL 125, HDL was 73, mild elevated TSH 5.76, vitamin D was normal 50 1.7, A1c was mild elevated 6.0  UPDATE 01/25/2018CM Ana Russell, 61 year old female returns for follow-up. She has a history of relapsing remitting multiple sclerosis, original symptoms in 2000. She has been on Rebif since 2009 tolerating the medication well without injection site issues or other side effects. Last MRI of the brain in 2015 showing extensive periventricular and subcortical white matter hyperintensities consistent with multiple sclerosis. No enhancing lesions are noted. Presence of multiple T1 black holes indicates chronic disease. She is not interested in having a repeat MRI. She remains very active walking 2 miles every day, she uses a walking stick. Does  some weight training. She has had no new medical issues since last seen. She returns for reevaluation.  REVIEW OF SYSTEMS: Full 14 system review of systems performed and notable only for those listed, all others are neg:  Constitutional: neg  Cardiovascular: neg Ear/Nose/Throat: Ringing in the ears Skin: neg Eyes: neg Respiratory: neg Gastroitestinal: neg  Hematology/Lymphatic: neg  Endocrine:  neg Musculoskeletal:neg Allergy/Immunology: neg Neurological: neg Psychiatric: neg Sleep : neg   ALLERGIES: No Known Allergies  HOME MEDICATIONS: Outpatient Medications Prior to Visit  Medication Sig Dispense Refill  . Cholecalciferol (VITAMIN D PO) Take by mouth.      . interferon beta-1a (REBIF) 44 MCG/0.5ML SOSY injection Inject subcutaneously 39mcg three times a week 12 Syringe 11  . SYNTHROID 100 MCG tablet 100 mg daily.  0   No facility-administered medications prior to visit.     PAST MEDICAL HISTORY: Past Medical History:  Diagnosis Date  . C. difficile colitis 2013  . Endometrial hyperplasia without atypia, simple 09/2003   NORMAL ENDOMETRIAL BIOPSY 03/2005  . Fluid retention    in ears  . Hypothyroidism   . MS (multiple sclerosis) ([place]) 02/25/2011  . Osteoporosis 05/2015   T score -2.9 left femoral neck stable from prior DEXA 2013    PAST SURGICAL HISTORY: Past Surgical History:  Procedure Laterality Date  . DILATION AND CURETTAGE OF UTERUS     TAB  . HYSTEROSCOPY  09/2003   POLYPS  . KNEE SURGERY  2011   bilateral knee scopes  . PELVIC LAPAROSCOPY    . THYROIDECTOMY, PARTIAL      FAMILY HISTORY: Family History  Problem Relation Age of Onset  . Diabetes Mother   . Diabetes Paternal Grandfather   . Cancer Sister     Thyroid    SOCIAL HISTORY: Social History   Social History  . Marital status: Married    Spouse name: Ed  . Number of children: 0  . Years of education: Bachelors   Occupational History  . Not on file.   Social History Main Topics  . Smoking status: Former Research scientist (life sciences)  . Smokeless tobacco: Never Used     Comment: Quit 8 years ago.  . Alcohol use 0.0 oz/week     Comment: TUES THURS SAT SUN WINE  . Drug use: No  . Sexual activity: Yes    Birth control/ protection: Post-menopausal     Comment: 1st intercourse 75 yo-5 partners   Other Topics Concern  . Not on file   Social History Narrative   Patient lives at home with  husband (Ed)   Patient is right handed   Education level Bachelors degree   Caffeine consumption is 3 cups daily     PHYSICAL EXAM  Vitals:   09/30/16 1315  BP: (!) 199/80  Pulse: 82  Weight: 121 lb 9.6 oz (55.2 kg)  Height: 5\' 6"  (1.676 m)   Body mass index is 19.63 kg/m.  Generalized: Well developed, in no acute distress  Head: normocephalic and atraumatic,. Oropharynx benign  Neck: Supple, no carotid bruits  Cardiac: Regular rate rhythm, no murmur  Musculoskeletal: No deformity   Neurological examination   Mentation: Alert oriented to time, place, history taking. Attention span and concentration appropriate. Recent and remote memory intact.  Follows all commands speech and language fluent.   Cranial nerve II-XII: Pupils were equal round reactive to light extraocular movements were full, visual field were full on confrontational test. Facial sensation and strength were normal. hearing was intact to finger rubbing bilaterally. Uvula tongue midline. head turning and shoulder shrug were normal and symmetric.Tongue protrusion into cheek strength was normal. Motor: normal bulk and tone, full strength in the BUE, BLE, fine finger movements normal, no pronator drift. No focal weakness Sensory: normal and symmetric to light touch, pinprick, and  Vibration, in the upper and lower extremities  Coordination: finger-nose-finger, heel-to-shin bilaterally, no dysmetria Reflexes: Brachioradialis 2/2, biceps 2/2, triceps 2/2, patellar 2/2, Achilles 2/2, plantar responses were flexor bilaterally. Gait and Station: Rising up from seated position without assistance, normal stance,  moderate stride, good arm swing, smooth turning, able to perform tiptoe, and heel walking without difficulty. Tandem gait is mildly unsteady   DIAGNOSTIC DATA (LABS, IMAGING, TESTING) -     ASSESSMENT AND PLAN  61 y.o. year old female  has a past medical history of MS (multiple sclerosis) ([place]) (02/25/2011);  relapsing remitting since at least 2000. She has been  on Rebif since 2009 . Last MRI in January 2015 without significant change from previous, she has no interest in having a repeat MRI. The patient is a current patient of Dr.Yan  who is out of the office today . This note is sent to the work in doctor.     Continue Rebif for relapsing remitting multiple sclerosis Will check labs today to monitor adverse effects of Continue moderate exercise Return to clinic in one year  Dennie Bible, [facility], [facility], [facility] [address], [place], [place] [address] [phone]

## 2016-09-30 NOTE — Patient Instructions (Signed)
Continue Rebif at current dose does not need refills Will check labs today Follow-up yearly and when necessary

## 2016-10-01 ENCOUNTER — Telehealth: Payer: Self-pay | Admitting: *Deleted

## 2016-10-01 LAB — CBC WITH DIFFERENTIAL/PLATELET
BASOS ABS: 0 10*3/uL (ref 0.0–0.2)
Basos: 0 %
EOS (ABSOLUTE): 0 10*3/uL (ref 0.0–0.4)
Eos: 1 %
Hematocrit: 40.2 % (ref 34.0–46.6)
Hemoglobin: 13.5 g/dL (ref 11.1–15.9)
IMMATURE GRANS (ABS): 0 10*3/uL (ref 0.0–0.1)
IMMATURE GRANULOCYTES: 0 %
LYMPHS: 30 %
Lymphocytes Absolute: 1.2 10*3/uL (ref 0.7–3.1)
MCH: 29.7 pg (ref 26.6–33.0)
MCHC: 33.6 g/dL (ref 31.5–35.7)
MCV: 89 fL (ref 79–97)
Monocytes Absolute: 0.5 10*3/uL (ref 0.1–0.9)
Monocytes: 12 %
NEUTROS PCT: 57 %
Neutrophils Absolute: 2.3 10*3/uL (ref 1.4–7.0)
PLATELETS: 173 10*3/uL (ref 150–379)
RBC: 4.54 x10E6/uL (ref 3.77–5.28)
RDW: 14.1 % (ref 12.3–15.4)
WBC: 4 10*3/uL (ref 3.4–10.8)

## 2016-10-01 LAB — COMPREHENSIVE METABOLIC PANEL
A/G RATIO: 1.6 (ref 1.2–2.2)
ALK PHOS: 68 IU/L (ref 39–117)
ALT: 27 IU/L (ref 0–32)
AST: 31 IU/L (ref 0–40)
Albumin: 4.4 g/dL (ref 3.6–4.8)
BUN/Creatinine Ratio: 19 (ref 12–28)
BUN: 15 mg/dL (ref 8–27)
Bilirubin Total: <0.2 mg/dL (ref 0.0–1.2)
CALCIUM: 9.5 mg/dL (ref 8.7–10.3)
CHLORIDE: 99 mmol/L (ref 96–106)
CO2: 28 mmol/L (ref 18–29)
Creatinine, Ser: 0.79 mg/dL (ref 0.57–1.00)
GFR calc Af Amer: 93 mL/min/{1.73_m2} (ref >59)
GFR, EST NON AFRICAN AMERICAN: 81 mL/min/{1.73_m2} (ref >59)
Globulin, Total: 2.8 g/dL (ref 1.5–4.5)
Glucose: 81 mg/dL (ref 65–99)
Potassium: 4.3 mmol/L (ref 3.5–5.2)
Sodium: 143 mmol/L (ref 134–144)
Total Protein: 7.2 g/dL (ref 6.0–8.5)

## 2016-10-01 NOTE — Telephone Encounter (Signed)
-----   Message from Dennie Bible, NP sent at 10/01/2016  8:11 AM EST ----- Labs stable please call the patient

## 2016-10-01 NOTE — Telephone Encounter (Signed)
Spoke to pt and relayed that her labs were good.  She will look on mychart.  She verbalized understanding.

## 2016-12-01 ENCOUNTER — Telehealth: Payer: Self-pay

## 2016-12-01 NOTE — Telephone Encounter (Signed)
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  She is wanting to see Dr. Collene Mares and wants you to her   Upon discussion to clarify she is talking about the "ridgelike area" in the posterior vagina that you noted on exam and u/s on 11/05/15.  She said she went to urologist to see if it was related to her bladder and he said her bladder was healthy. She said that her BM's are irregular and she thinks this "pocket" might be causing it. She wants to go see Dr. Collene Mares so she can tell her how to get rid of "the pocket" because she thinks it is causing the problems with her bowel.  She said she went to urologist and they told her GYN problem she said GI nurse told her sounded like GYN problem but " Dr. Phineas Real did not tell me what to do to get rid of it."  What to rec?

## 2016-12-01 NOTE — Telephone Encounter (Signed)
She is asking me questions based on exam one year ago. Recommend follow up annual exam now and we can discuss.

## 2016-12-01 NOTE — Telephone Encounter (Signed)
Left message in voice mail per PDR access note that Dr. Loetta Rough rec office visit for annual exam and will discuss then. Has been a year  Since he examined her last.  I left [facility] phone number and prompt for appt desk.

## 2016-12-20 ENCOUNTER — Encounter: Payer: Self-pay | Admitting: Gynecology

## 2016-12-20 ENCOUNTER — Ambulatory Visit (INDEPENDENT_AMBULATORY_CARE_PROVIDER_SITE_OTHER): Payer: 59 | Admitting: Gynecology

## 2016-12-20 VITALS — BP 118/74 | Ht 66.0 in | Wt 116.0 lb

## 2016-12-20 DIAGNOSIS — Z01411 Encounter for gynecological examination (general) (routine) with abnormal findings: Secondary | ICD-10-CM

## 2016-12-20 DIAGNOSIS — M810 Age-related osteoporosis without current pathological fracture: Secondary | ICD-10-CM | POA: Diagnosis not present

## 2016-12-20 DIAGNOSIS — Z13220 Encounter for screening for lipoid disorders: Secondary | ICD-10-CM | POA: Diagnosis not present

## 2016-12-20 DIAGNOSIS — N952 Postmenopausal atrophic vaginitis: Secondary | ICD-10-CM

## 2016-12-20 DIAGNOSIS — E039 Hypothyroidism, unspecified: Secondary | ICD-10-CM

## 2016-12-20 LAB — CBC WITH DIFFERENTIAL/PLATELET
BASOS PCT: 0 %
Basophils Absolute: 0 cells/uL (ref 0–200)
EOS ABS: 0 {cells}/uL — AB (ref 15–500)
Eosinophils Relative: 0 %
HEMATOCRIT: 43.2 % (ref 35.0–45.0)
HEMOGLOBIN: 14.2 g/dL (ref 11.7–15.5)
LYMPHS ABS: 1296 {cells}/uL (ref 850–3900)
LYMPHS PCT: 36 %
MCH: 30.1 pg (ref 27.0–33.0)
MCHC: 32.9 g/dL (ref 32.0–36.0)
MCV: 91.7 fL (ref 80.0–100.0)
MONO ABS: 540 {cells}/uL (ref 200–950)
MPV: 10.6 fL (ref 7.5–12.5)
Monocytes Relative: 15 %
Neutro Abs: 1764 cells/uL (ref 1500–7800)
Neutrophils Relative %: 49 %
Platelets: 190 10*3/uL (ref 140–400)
RBC: 4.71 MIL/uL (ref 3.80–5.10)
RDW: 13.5 % (ref 11.0–15.0)
WBC: 3.6 10*3/uL — ABNORMAL LOW (ref 3.8–10.8)

## 2016-12-20 LAB — COMPREHENSIVE METABOLIC PANEL
ALBUMIN: 3.9 g/dL (ref 3.6–5.1)
ALT: 18 U/L (ref 6–29)
AST: 23 U/L (ref 10–35)
Alkaline Phosphatase: 55 U/L (ref 33–130)
BILIRUBIN TOTAL: 0.3 mg/dL (ref 0.2–1.2)
BUN: 18 mg/dL (ref 7–25)
CALCIUM: 9.4 mg/dL (ref 8.6–10.4)
CO2: 27 mmol/L (ref 20–31)
Chloride: 104 mmol/L (ref 98–110)
Creat: 0.72 mg/dL (ref 0.50–0.99)
Glucose, Bld: 91 mg/dL (ref 65–99)
Potassium: 4.2 mmol/L (ref 3.5–5.3)
Sodium: 141 mmol/L (ref 135–146)
Total Protein: 6.9 g/dL (ref 6.1–8.1)

## 2016-12-20 LAB — TSH: TSH: 0.3 mIU/L — ABNORMAL LOW

## 2016-12-20 LAB — LIPID PANEL
Cholesterol: 231 mg/dL — ABNORMAL HIGH (ref <200)
HDL: 77 mg/dL (ref >50)
LDL CALC: 116 mg/dL — AB (ref <100)
TRIGLYCERIDES: 188 mg/dL — AB (ref <150)
Total CHOL/HDL Ratio: 3 Ratio (ref <5.0)
VLDL: 38 mg/dL — ABNORMAL HIGH (ref <30)

## 2016-12-20 MED ORDER — ESTRADIOL 10 MCG VA TABS: 1.0000 | ORAL_TABLET | Freq: Every day | VAGINAL | 4 refills | Status: DC

## 2016-12-20 NOTE — Patient Instructions (Signed)
Start on the vaginal estrogen tablets nightly for 2 weeks and then twice weekly as a maintenance. Follow up with me in 2-3 months for reexamination.   Call to Schedule your mammogram  Facilities in [place]: 1)  [facility]. [address] [place] Phone: [phone] 2)  Dr. Isaiah Blakes at [facility] [address] Phone: [phone]     Mammogram A mammogram is an X-ray test to find changes in a woman's breast. You should get a mammogram if:  You are 8 years of age or older  You have risk factors.   Your doctor recommends that you have one.  BEFORE THE TEST  Do not schedule the test the week before your period, especially if your breasts are sore during this time.  On the day of your mammogram:  Wash your breasts and armpits well. After washing, do not put on any deodorant or talcum powder on until after your test.   Eat and drink as you usually do.   Take your medicines as usual.   If you are diabetic and take insulin, make sure you:   Eat before coming for your test.   Take your insulin as usual.   If you cannot keep your appointment, call before the appointment to cancel. Schedule another appointment.  TEST  You will need to undress from the waist up. You will put on a hospital gown.   Your breast will be put on the mammogram machine, and it will press firmly on your breast with a piece of plastic called a compression paddle. This will make your breast flatter so that the machine can X-ray all parts of your breast.   Both breasts will be X-rayed. Each breast will be X-rayed from above and from the side. An X-ray might need to be taken again if the picture is not good enough.   The mammogram will last about 15 to 30 minutes.  AFTER THE TEST Finding out the results of your test Ask when your test results will be ready. Make sure you get your test results.  Document Released: 11/19/2008  Document Revised: 08/12/2011 Document Reviewed: 11/19/2008 [facility] Patient Information 2012 [place].

## 2016-12-20 NOTE — Progress Notes (Signed)
Ana Russell 25-Feb-1956 [id number]        60 y.o.  G2P0020 for annual exam.  Was seen last year complaining of pressure. Was found to have a constriction-like band posteriorly in the vagina. Was having urinary retention at the time which seemed to resolve this area when she emptied her bladder. She had an ultrasound which showed several small myomas but otherwise was normal. She is sexually active but notes at times difficulty with penetration hitting this band. Other times she does well. Is using Replens for vaginal dryness. Also has a history of osteoporosis and relates that she is actively exercising.   Past medical history,surgical history, problem list, medications, allergies, family history and social history were all reviewed and documented as reviewed in the EPIC chart.  ROS:  Performed with pertinent positives and negatives included in the history, assessment and plan.   Additional significant findings :  None  Exam: Caryn Bee  assistant Vitals:   12/20/16 1031  BP: 118/74  Weight: 116 lb (52.6 kg)  Height: 5\' 6"  (1.676 m)   Body mass index is 18.72 kg/m.  General appearance:  Normal affect, orientation and appearance. Skin: Grossly normal HEENT: Without gross lesions.  No cervical or supraclavicular adenopathy. Thyroid normal.  Lungs:  Clear without wheezing, rales or rhonchi Cardiac: RR, without RMG Abdominal:  Soft, nontender, without masses, guarding, rebound, organomegaly or hernia Breasts:  Examined lying and sitting without masses, retractions, discharge or axillary adenopathy. Pelvic:  Ext, BUS, Vagina: With atrophic changes. Constriction band running posterior vaginal wall which does relax with digital pressure. No palpable masses or other abnormalities. No significant rectocele.  Cervix: Atrophic  Uterus: Anteverted, normal size, shape and contour, midline and mobile nontender   Adnexa: Without masses or tenderness    Anus and perineum: Normal    Rectovaginal: Normal sphincter tone without palpated masses or tenderness.    Assessment/Plan:  61 y.o. G70P0020 female for annual exam.   1. With constriction band posterior vaginal wall.  Does relax with digital pressure. I think this is more levator related. Does have significant atrophic changes. Options to treat this reviewed. She is using Replens. Not having significant global symptoms such as hot flushes or sweats. Reviewed options for vaginal estrogen. Delivery routes to include ring, cream, Vagifem discussed. Absorption risks with systemic effects to include breast, thrombosis and endometrial stimulation all reviewed. Patient wants to go ahead and try this which I think will help. We'll start with Vagifem nightly 2 weeks and then twice weekly maintenance. I asked her to return in 2-3 months for reexam and will see how she's doing. Patient knows to call if any bleeding. 2. Osteoporosis. DEXA 05/2015 T score -2.9 stable from prior DEXA. We have discussed treatment options and my recommendations to consider treatment several times. The patient does not want to start medication and states that she is actively exercising and feels comfortable with this and does not want to take any medication. She understands the increased fracture risk regardless. Will check vitamin D level today. 3. Pap smear/HPV 2015 negative. No Pap smear done today. No history of significant abnormal Pap smears. Will plan repeat Pap smear at 5 year interval per current screening guidelines. 4. Colonoscopy scheduled with Dr. Collene Mares. 5. Mammography 2012. I again reviewed with her my strong recommendation to schedule a screening mammogram. Most common cancer in women reviewed. Benefits of early detection discussed. Patient states that she will schedule this and I gave her names and numbers  to call. SBE monthly reviewed. 6. Health maintenance. Patient requests baseline labs. CBC, CMP, lipid profile, TSH, vitamin D urinalysis ordered.  Follow up in 2-3 months, sooner if any issues. Follow up in one year for annual exam.  Additional time in excess of her routine gynecologic exam was spent in direct face to face counseling and coordination of care in regards to her atrophic vaginal changes, vaginal constriction band, osteoporosis.   Anastasio Auerbach MD, 11:09 AM 12/20/2016

## 2016-12-21 LAB — VITAMIN D 25 HYDROXY (VIT D DEFICIENCY, FRACTURES): Vit D, 25-Hydroxy: 48 ng/mL (ref 30–100)

## 2016-12-24 ENCOUNTER — Telehealth: Payer: Self-pay

## 2016-12-24 MED ORDER — ESTRADIOL 10 MCG VA TABS
1.0000 | ORAL_TABLET | Freq: Every day | VAGINAL | 4 refills | Status: DC
Start: 2016-12-24 — End: 2017-02-07

## 2016-12-24 NOTE — Telephone Encounter (Signed)
Husband called and said pharmacy does not have Estradiol 10 mcg. Vag tabs and does not know when they will. He wants [place] to fax him a copy of her estradiol Rx so he can try to find a pharmacy that has it. Rx faxed to him.

## 2017-02-07 ENCOUNTER — Telehealth: Payer: Self-pay

## 2017-02-07 MED ORDER — ESTRADIOL 10 MCG VA TABS: 1.0000 | ORAL_TABLET | Freq: Every day | VAGINAL | 4 refills | Status: DC

## 2017-02-07 NOTE — Telephone Encounter (Signed)
Husband called and said that their [facility] does not have generic Vagifem. He has called around and [facility] on [address] has it and he asked me to send Rx there. Rx sent.

## 2017-03-17 ENCOUNTER — Ambulatory Visit: Payer: 59 | Admitting: Gynecology

## 2017-04-18 ENCOUNTER — Telehealth: Payer: Self-pay | Admitting: Neurology

## 2017-04-18 NOTE — Telephone Encounter (Signed)
PA approved by [employer] [phone]) through 04/18/18.  Pt MW#[id number].  IH#[id number].  Pt and pharmacy notified.

## 2017-04-18 NOTE — Telephone Encounter (Signed)
Pt calling re: a call from  [place], [place] [facility] [place] [phone] (Phone) [phone] (Fax)    that a prior authorization for interferon beta-1a (REBIF) 44 MCG/0.5ML SOSY injection   Is needed, please call.  Pt was told to make this request as urgent

## 2017-06-08 ENCOUNTER — Encounter: Payer: Self-pay | Admitting: Gynecology

## 2017-06-08 ENCOUNTER — Ambulatory Visit (INDEPENDENT_AMBULATORY_CARE_PROVIDER_SITE_OTHER): Payer: 59 | Admitting: Gynecology

## 2017-06-08 VITALS — BP 116/74

## 2017-06-08 DIAGNOSIS — N952 Postmenopausal atrophic vaginitis: Secondary | ICD-10-CM

## 2017-06-08 NOTE — Progress Notes (Signed)
    Ana Russell 03/25/1956 [id number]        61 y.o.  G2P0020 presents in follow up. Patient was seen in April complaining of difficulty with intercourse having difficulty with penetration is if hitting a band. Also with vaginal dryness and dyspareunia. Examined that time showed a constriction type and running posterior vaginal wall but did relax with pressure felt to be muscular. No other palpable abnormalities. Was started on Vagifem twice weekly and follows up now. Patient reports that she feels things are better but still having some difficulty with penetration  Past medical history,surgical history, problem list, medications, allergies, family history and social history were all reviewed and documented in the EPIC chart.  Directed ROS with pertinent positives and negatives documented in the history of present illness/assessment and plan.  Exam: Caryn Bee assistant Vitals:   06/08/17 1018  BP: 116/74   General appearance:  Normal Abdomen soft nontender without masses guarding rebound Pelvic external BUS vagina with atrophic changes. No posterior constriction band noted. Admits 2 fingers to the cervix. Cervix atrophic. Uterus anteverted normal size midline mobile nontender. Adnexa without masses or tenderness.  Assessment/Plan:  61 y.o. G2P0020 with improvement since starting vaginal estrogen. Constriction band no longer present. Patient still reports some difficulty with penetration although speculum was easily inserted. I recommended she start using vaginal dilators to help relax the vaginal musculature to allow for easier penetration. Continue using Vagifem twice weekly and follow up in April when she is due for annual exam, sooner if issues. Patient agrees with the plan.    Anastasio Auerbach MD, 10:47 AM 06/08/2017

## 2017-06-08 NOTE — Patient Instructions (Signed)
Try vaginal dilators.  Continue the vaginal estrogen twice weekly.  Follow up in April 2019 for annual exam when due.

## 2017-08-01 ENCOUNTER — Other Ambulatory Visit: Payer: Self-pay | Admitting: Neurology

## 2017-10-03 NOTE — Progress Notes (Signed)
[facility]  PATIENT: DON GIARRUSSO DOB: [birth year]   REASON FOR VISIT: Follow-up for multiple sclerosis relapsing remitting HISTORY FROM: Patient    HISTORY OF PRESENT ILLNESS:Mrs.Clift is a 62 years old right-handed Caucasian female, came in to followup for relapsing remitting multiple sclerosis. 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 She only has mild gait difficulty, taking her Rebif on Monday, Wednesday, Friday, premedicated with Advil, overall tolerating the medication very well  Most recent MRI of the brain was 2012, had a repeat scan in January 20/15 his, she came in to review her MRI findings, we have seen the films together, with continued evidence of multiple supratentorium periventricular oval-shaped lesion, there was T1 black holes, no contrast enhancement,   After discussed with patient, she is overall doing well clinically fairly stable imaging-wise, she wants to stay on current treatment, She reported a history of mildly decreased white count, has been under close supervision of her primary care physician Dr. Noah Delaine at [place], I do not have recent laboratory evaluations  UPDATE Jan 14th 2016: She is doing very well, enjoys her painting, last repeat MRI January 2015, MRI scans of brain showing extensive periventricular and subcortical white matter hyperintensities consistent with multiple sclerosis. No enhancing lesions are noted. Presence of multiple T1 black holes indicates chronic disease. 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 I have reviewed MRI of the brain with patient, there was no significant changes compared to previous scans.  She is continuing rebif Monday Wednesday Friday, premedicated with Advil, She has mild unsteady gait, using walking sticks, occasionally  Most recent laboratory showed mild elevated glucose 127, normal liver functional test, mild elevated LDL 116  UPDATE Sep 25 2015:YYShe walks everyday 2 miles, weight training,  She is doing pull up, She walks with a stick, she has urinary urgency, mild constipation.   She noticed mild left ear tinnitus with mild left hearing loss.  Then she got right hearing loss. She still taking Rebif, tolerating well,  I reviewed laboratory in December 2016, elevated LDL 125, HDL was 73, mild elevated TSH 5.76, vitamin D was normal 50 1.7, A1c was mild elevated 6.0  UPDATE 01/25/2018CM Ms. Spiers, 62 year old female returns for follow-up. She has a history of relapsing remitting multiple sclerosis, original symptoms in 2000. She has been on Rebif since 2009 tolerating the medication well without injection site issues or other side effects. Last MRI of the brain in 2015 showing extensive periventricular and subcortical white matter hyperintensities consistent with multiple sclerosis. No enhancing lesions are noted. Presence of multiple T1 black holes indicates chronic disease. She is not interested in having a repeat MRI. She remains very active walking 2 miles every day, she uses a walking stick. Does  some weight training. She has had no new medical issues since last seen. She returns for reevaluation. UPDATE 1/29/2019CM Ms. Krakow, 62 year old female returns for follow-up with a history of multiple sclerosis relapsing remitting originally diagnosed in 2000.  She is currently on rebif 3 times a week without injection site issues.  She was  recently diagnosed with Mnire's disease.  She also complains with her vision  being worse however it is 20/30 in the office today.  She has routine follow-up with ophthalmology.  Last MRI of the brain in 2015 showed chronic disease.  She has not had exacerbation of symptoms and is not interested in having repeat MRI of the brain.  She continues to be very active, walks 2 miles a day and exercises using weight machines.  Her last lipid profile was elevated and she is trying to control this with diet.  She returns for reevaluation REVIEW OF SYSTEMS: Full 14 system review of systems performed and notable only for those listed, all others are neg:  Constitutional: neg  Cardiovascular: neg Ear/Nose/Throat: Ringing in the ears, recently diagnosed with Mnire's disease Skin: neg Eyes: neg Respiratory: neg Gastroitestinal: neg  Hematology/Lymphatic: neg  Endocrine: neg Musculoskeletal:neg Allergy/Immunology: neg Neurological: neg Psychiatric: neg Sleep : neg   ALLERGIES: No Known Allergies  HOME MEDICATIONS: Outpatient Medications Prior to Visit  Medication Sig Dispense Refill  . Cholecalciferol (VITAMIN D PO) Take by mouth.      . Estradiol 10 MCG TABS vaginal tablet Place 1 tablet (10 mcg total) vaginally at bedtime. For 2 weeks and then twice weekly 18 tablet 4  . REBIF 44 MCG/0.5ML SOSY injection INJECT 1 PREFILLED SYRINGE SUBCUTANEOUSLY 3 TIMES PER WEEK 12 Syringe 11  . SYNTHROID 100 MCG tablet 100 mg daily.  0   No facility-administered medications prior to visit.     PAST MEDICAL HISTORY: Past Medical History:  Diagnosis Date  . C. difficile colitis 2013  . Endometrial hyperplasia without atypia, simple 09/2003   NORMAL ENDOMETRIAL BIOPSY 03/2005  . Fluid retention    in ears  . Hypothyroidism   . Meniere disease   . MS (multiple sclerosis) ([place]) 02/25/2011  . Osteoporosis 05/2015   T score -2.9 left femoral neck stable from prior DEXA 2013    PAST SURGICAL HISTORY: Past Surgical History:  Procedure Laterality Date  . DILATION AND CURETTAGE OF  UTERUS     TAB  . HYSTEROSCOPY  09/2003   POLYPS  . KNEE SURGERY  2011   bilateral knee scopes  . PELVIC LAPAROSCOPY    . THYROIDECTOMY, PARTIAL      FAMILY HISTORY: Family History  Problem Relation Age of Onset  . Diabetes Mother   . Diabetes Paternal Grandfather   . Cancer Sister        Thyroid    SOCIAL HISTORY: Social History   Socioeconomic History  . Marital status: Married    Spouse name: Ed  . Number of children: 0  . Years of education: Bachelors  . Highest education level: Not on file  Social Needs  . Financial resource strain: Not on file  . Food insecurity - worry: Not on file  . Food insecurity - inability: Not on file  . Transportation needs - medical: Not on file  . Transportation needs - non-medical: Not on file  Occupational History  . Not on file  Tobacco Use  . Smoking status: Former Research scientist (life sciences)  . Smokeless tobacco: Never Used  . Tobacco comment: Quit 8 years ago.  Substance and Sexual Activity  . Alcohol use: Yes    Alcohol/week: 0.0 oz    Comment: TUES THURS SAT SUN WINE  . Drug use: No  . Sexual activity: Yes    Birth control/protection: Post-menopausal    Comment: 1st intercourse 75 yo-5 partners  Other Topics Concern  . Not  on file  Social History Narrative   Patient lives at home with husband (Ed)   Patient is right handed   Education level Bachelors degree   Caffeine consumption is 3 cups daily     PHYSICAL EXAM  Vitals:   10/04/17 1000  BP: 131/79  Pulse: 78  Weight: 119 lb (54 kg)   Body mass index is 19.21 kg/m.  Generalized: Well developed, in no acute distress  Head: normocephalic and atraumatic,. Oropharynx benign  Neck: Supple,  Musculoskeletal: No deformity   Neurological examination   Mentation: Alert oriented to time, place, history taking. Attention span and concentration appropriate. Recent and remote memory intact.  Follows all commands speech and language fluent.   Cranial nerve II-XII: Pupils were equal  round reactive to light extraocular movements were full, visual field were full on confrontational test. Facial sensation and strength were normal. hearing was intact to finger rubbing bilaterally. Uvula tongue midline. head turning and shoulder shrug were normal and symmetric.Tongue protrusion into cheek strength was normal. Motor: normal bulk and tone, full strength in the BUE, BLE,  Sensory: normal and symmetric to light touch, pinprick, and  Vibration, in the upper and lower extremities  Coordination: finger-nose-finger, heel-to-shin bilaterally, no dysmetria Reflexes: Brachioradialis 2/2, biceps 2/2, triceps 2/2, patellar 2/2, Achilles 2/2, plantar responses were flexor bilaterally. Gait and Station: Rising up from seated position without assistance, normal stance,  moderate stride, good arm swing, smooth turning, able to perform tiptoe, and heel walking without difficulty. Tandem gait is mildly unsteady   DIAGNOSTIC DATA (LABS, IMAGING, TESTING) -     ASSESSMENT AND PLAN  62 y.o. year old female  has a past medical history of MS (multiple sclerosis) ([place]) (02/25/2011); relapsing remitting since at least 2000. She has been  on Rebif since 2009 . Last MRI in January 2015 without significant change from previous, she has no interest in having a repeat MRI.      PLAN: Continue Rebif for relapsing remitting multiple sclerosis, just refilled for 1 year in November labs CBC , CMP and Lipid profile by PCP due in April reviewed previous CBC CMP within normal limits Continue moderate exercise Call for exacerbation of symptoms Return to clinic in one year  Dennie Bible, [facility], [facility], [facility] [address] [place], [place] [address] [phone]

## 2017-10-04 ENCOUNTER — Ambulatory Visit (INDEPENDENT_AMBULATORY_CARE_PROVIDER_SITE_OTHER): Payer: 59 | Admitting: Nurse Practitioner

## 2017-10-04 ENCOUNTER — Encounter: Payer: Self-pay | Admitting: Nurse Practitioner

## 2017-10-04 VITALS — BP 131/79 | HR 78 | Wt 119.0 lb

## 2017-10-04 DIAGNOSIS — G35 Multiple sclerosis: Secondary | ICD-10-CM | POA: Diagnosis not present

## 2017-10-04 NOTE — Patient Instructions (Signed)
Continue Rebif for relapsing remitting multiple sclerosis, just refilled for 1 year in November labs CBC , CMP and Lipid profile by PCP Continue moderate exercise Return to clinic in one year

## 2017-10-06 NOTE — Progress Notes (Signed)
I have reviewed and agreed above plan. 

## 2017-12-22 ENCOUNTER — Ambulatory Visit (INDEPENDENT_AMBULATORY_CARE_PROVIDER_SITE_OTHER): Payer: 59 | Admitting: Gynecology

## 2017-12-22 ENCOUNTER — Encounter: Payer: Self-pay | Admitting: Gynecology

## 2017-12-22 VITALS — BP 118/76 | Ht 65.0 in | Wt 118.0 lb

## 2017-12-22 DIAGNOSIS — Z01411 Encounter for gynecological examination (general) (routine) with abnormal findings: Secondary | ICD-10-CM | POA: Diagnosis not present

## 2017-12-22 DIAGNOSIS — N952 Postmenopausal atrophic vaginitis: Secondary | ICD-10-CM

## 2017-12-22 DIAGNOSIS — E7800 Pure hypercholesterolemia, unspecified: Secondary | ICD-10-CM

## 2017-12-22 DIAGNOSIS — E039 Hypothyroidism, unspecified: Secondary | ICD-10-CM | POA: Diagnosis not present

## 2017-12-22 DIAGNOSIS — E559 Vitamin D deficiency, unspecified: Secondary | ICD-10-CM

## 2017-12-22 DIAGNOSIS — M810 Age-related osteoporosis without current pathological fracture: Secondary | ICD-10-CM | POA: Diagnosis not present

## 2017-12-22 MED ORDER — ESTRADIOL 10 MCG VA TABS: 1.0000 | ORAL_TABLET | Freq: Every day | VAGINAL | 4 refills | Status: DC

## 2017-12-22 NOTE — Progress Notes (Signed)
    Ana Russell 1956-07-20 [id number]        61 y.o.  G2P0020 for annual gynecologic exam.  Doing well without gynecologic complaints.  Past medical history,surgical history, problem list, medications, allergies, family history and social history were all reviewed and documented as reviewed in the EPIC chart.  ROS:  Performed with pertinent positives and negatives included in the history, assessment and plan.   Additional significant findings : None   Exam: Caryn Bee assistant Vitals:   12/22/17 1054  BP: 118/76  Weight: 118 lb (53.5 kg)  Height: 5\' 5"  (1.651 m)   Body mass index is 19.64 kg/m.  General appearance:  Normal affect, orientation and appearance. Skin: Grossly normal HEENT: Without gross lesions.  No cervical or supraclavicular adenopathy. Thyroid normal.  Lungs:  Clear without wheezing, rales or rhonchi Cardiac: RR, without RMG Abdominal:  Soft, nontender, without masses, guarding, rebound, organomegaly or hernia Breasts:  Examined lying and sitting without masses, retractions, discharge or axillary adenopathy. Pelvic:  Ext, BUS, Vagina: With atrophic changes.  Cervix: With atrophic changes.  Uterus: Anteverted, normal size, shape and contour, midline and mobile nontender   Adnexa: Without masses or tenderness    Anus and perineum: Normal   Rectovaginal: Normal sphincter tone without palpated masses or tenderness.    Assessment/Plan:  62 y.o. G7P0020 female for annual gynecologic exam.   1. Postmenopausal/atrophic genital changes.  Was having significant issues with atrophic changes and vaginismus type pattern.  Started on Vagifem and has had a dramatic relief of her symptoms.  Doing well and wants to continue.  Not having hot flushes or sweats.  No vaginal bleeding.  Refill times 1 year provided. 2. Mammogram 2012.  Patient knows she is way overdue and agrees to call and schedule.  Breast exam normal today. 3. Osteoporosis.  DEXA 2016 T score -2.9.  Has  declined treatment in the past.  Recommend DEXA now and patient agrees to schedule in follow-up for this.  History of vitamin D deficiency.  Supplementing now.  Check vitamin D level 4. Pap smear/HPV 02/2014.  No Pap smear done today.  No history of abnormal Pap smears.  Plan repeat Pap smear next year at 5-year interval per current screening guidelines. 5. Colonoscopy 2018.  Repeat at their recommended interval. 6. Health maintenance.  Patient requests fasting baseline labs.  CBC, CMP, lipid profile, TSH, vitamin D.  Follow-up for bone density.  Follow-up in 1 year for annual exam.   Anastasio Auerbach MD, 11:26 AM 12/22/2017

## 2017-12-22 NOTE — Patient Instructions (Signed)
Follow up for the Bone Density as scheduled   Call to Schedule your mammogram  Facilities in [place]: 1)  [facility]. [address] [place] Phone: [phone] 2)  Dr. Isaiah Blakes at [facility] [address] Phone: [phone]     Mammogram A mammogram is an X-ray test to find changes in a woman's breast. You should get a mammogram if:  You are 63 years of age or older  You have risk factors.   Your doctor recommends that you have one.  BEFORE THE TEST  Do not schedule the test the week before your period, especially if your breasts are sore during this time.  On the day of your mammogram:  Wash your breasts and armpits well. After washing, do not put on any deodorant or talcum powder on until after your test.   Eat and drink as you usually do.   Take your medicines as usual.   If you are diabetic and take insulin, make sure you:   Eat before coming for your test.   Take your insulin as usual.   If you cannot keep your appointment, call before the appointment to cancel. Schedule another appointment.  TEST  You will need to undress from the waist up. You will put on a hospital gown.   Your breast will be put on the mammogram machine, and it will press firmly on your breast with a piece of plastic called a compression paddle. This will make your breast flatter so that the machine can X-ray all parts of your breast.   Both breasts will be X-rayed. Each breast will be X-rayed from above and from the side. An X-ray might need to be taken again if the picture is not good enough.   The mammogram will last about 15 to 30 minutes.  AFTER THE TEST Finding out the results of your test Ask when your test results will be ready. Make sure you get your test results.  Document Released: 11/19/2008 Document Revised: 08/12/2011 Document Reviewed: 11/19/2008 [facility] Patient Information 2012 [place].

## 2017-12-23 LAB — CBC WITH DIFFERENTIAL/PLATELET
BASOS ABS: 20 {cells}/uL (ref 0–200)
Basophils Relative: 0.6 %
Eosinophils Absolute: 31 cells/uL (ref 15–500)
Eosinophils Relative: 0.9 %
HEMATOCRIT: 39.1 % (ref 35.0–45.0)
HEMOGLOBIN: 13.2 g/dL (ref 11.7–15.5)
LYMPHS ABS: 1187 {cells}/uL (ref 850–3900)
MCH: 30.1 pg (ref 27.0–33.0)
MCHC: 33.8 g/dL (ref 32.0–36.0)
MCV: 89.1 fL (ref 80.0–100.0)
MPV: 10.8 fL (ref 7.5–12.5)
Monocytes Relative: 17.2 %
NEUTROS ABS: 1578 {cells}/uL (ref 1500–7800)
NEUTROS PCT: 46.4 %
Platelets: 162 10*3/uL (ref 140–400)
RBC: 4.39 10*6/uL (ref 3.80–5.10)
RDW: 12.4 % (ref 11.0–15.0)
Total Lymphocyte: 34.9 %
WBC: 3.4 10*3/uL — ABNORMAL LOW (ref 3.8–10.8)
WBCMIX: 585 {cells}/uL (ref 200–950)

## 2017-12-23 LAB — COMPREHENSIVE METABOLIC PANEL
AG RATIO: 1.8 (calc) (ref 1.0–2.5)
ALBUMIN MSPROF: 4.2 g/dL (ref 3.6–5.1)
ALKALINE PHOSPHATASE (APISO): 55 U/L (ref 33–130)
ALT: 22 U/L (ref 6–29)
AST: 30 U/L (ref 10–35)
BUN: 11 mg/dL (ref 7–25)
CALCIUM: 9.3 mg/dL (ref 8.6–10.4)
CO2: 28 mmol/L (ref 20–32)
Chloride: 103 mmol/L (ref 98–110)
Creat: 0.92 mg/dL (ref 0.50–0.99)
Globulin: 2.3 g/dL (calc) (ref 1.9–3.7)
Glucose, Bld: 100 mg/dL — ABNORMAL HIGH (ref 65–99)
POTASSIUM: 3.9 mmol/L (ref 3.5–5.3)
SODIUM: 139 mmol/L (ref 135–146)
Total Bilirubin: 0.4 mg/dL (ref 0.2–1.2)
Total Protein: 6.5 g/dL (ref 6.1–8.1)

## 2017-12-23 LAB — VITAMIN D 25 HYDROXY (VIT D DEFICIENCY, FRACTURES): Vit D, 25-Hydroxy: 57 ng/mL (ref 30–100)

## 2017-12-23 LAB — LIPID PANEL
CHOLESTEROL: 207 mg/dL — AB (ref <200)
HDL: 82 mg/dL (ref >50)
LDL CHOLESTEROL (CALC): 110 mg/dL — AB
Non-HDL Cholesterol (Calc): 125 mg/dL (calc) (ref <130)
Total CHOL/HDL Ratio: 2.5 (calc) (ref <5.0)
Triglycerides: 64 mg/dL (ref <150)

## 2017-12-23 LAB — TSH: TSH: 1.13 m[IU]/L (ref 0.40–4.50)

## 2017-12-26 ENCOUNTER — Encounter: Payer: Self-pay | Admitting: Gynecology

## 2018-01-12 ENCOUNTER — Telehealth: Payer: Self-pay | Admitting: Gynecology

## 2018-01-12 ENCOUNTER — Encounter: Payer: Self-pay | Admitting: Gynecology

## 2018-01-12 ENCOUNTER — Other Ambulatory Visit: Payer: Self-pay | Admitting: Gynecology

## 2018-01-12 ENCOUNTER — Encounter: Payer: Self-pay | Admitting: *Deleted

## 2018-01-12 ENCOUNTER — Ambulatory Visit (INDEPENDENT_AMBULATORY_CARE_PROVIDER_SITE_OTHER): Payer: 59

## 2018-01-12 DIAGNOSIS — M810 Age-related osteoporosis without current pathological fracture: Secondary | ICD-10-CM | POA: Diagnosis not present

## 2018-01-12 NOTE — Telephone Encounter (Signed)
Tell patient that her bone density continues to show osteoporosis which is no surprise.  She continues to be at an increased risk for fracture with an indication to treat with medication.  She is always declined in the past.  If she is interested in discussing medication options then recommend office visit.

## 2018-01-12 NOTE — Telephone Encounter (Signed)
Sent mychart message

## 2018-02-05 ENCOUNTER — Other Ambulatory Visit: Payer: Self-pay | Admitting: Gynecology

## 2018-02-06 ENCOUNTER — Other Ambulatory Visit: Payer: Self-pay | Admitting: Gynecology

## 2018-04-04 ENCOUNTER — Ambulatory Visit: Payer: 59

## 2018-04-04 ENCOUNTER — Encounter

## 2018-04-04 ENCOUNTER — Ambulatory Visit (INDEPENDENT_AMBULATORY_CARE_PROVIDER_SITE_OTHER): Payer: 59 | Admitting: Podiatry

## 2018-04-04 ENCOUNTER — Encounter: Payer: Self-pay | Admitting: Podiatry

## 2018-04-04 VITALS — BP 114/84 | HR 74 | Resp 16

## 2018-04-04 DIAGNOSIS — Q828 Other specified congenital malformations of skin: Secondary | ICD-10-CM

## 2018-04-04 DIAGNOSIS — B353 Tinea pedis: Secondary | ICD-10-CM

## 2018-04-04 DIAGNOSIS — M779 Enthesopathy, unspecified: Principal | ICD-10-CM

## 2018-04-04 DIAGNOSIS — M778 Other enthesopathies, not elsewhere classified: Secondary | ICD-10-CM

## 2018-04-04 NOTE — Progress Notes (Signed)
Subjective:  Patient ID: Ana Russell, female    DOB: 1956-08-24,  MRN: [id number] HPI Chief Complaint  Patient presents with  . Foot Pain    Plantar forefoot bilateral - small, callused areas x 6 weeks, tender walking, keeps covered with bandaids for cushion, tried different shoes-no help  . Nail Problem    5th toenail right - nail is thick and dark, using ciclopirox, but hasn't seen improvement  . New Patient (Initial Visit)    62 y.o. female presents with the above complaint.   ROS: Denies fever chills nausea vomiting muscle aches pains calf pain back pain chest pain shortness of breath.  Past Medical History:  Diagnosis Date  . C. difficile colitis 2013  . Endometrial hyperplasia without atypia, simple 09/2003   NORMAL ENDOMETRIAL BIOPSY 03/2005  . Fluid retention    in ears  . Hypothyroidism   . Meniere disease   . MS (multiple sclerosis) ([place]) 02/25/2011  . Osteoporosis 01/2018   T score -2.7   Past Surgical History:  Procedure Laterality Date  . DILATION AND CURETTAGE OF UTERUS     TAB  . HYSTEROSCOPY  09/2003   POLYPS  . KNEE SURGERY  2011   bilateral knee scopes  . PELVIC LAPAROSCOPY    . THYROIDECTOMY, PARTIAL      Current Outpatient Medications:  .  Ascorbic Acid (VITAMIN C PO), Take by mouth., Disp: , Rfl:  .  Multiple Vitamin (MULTIVITAMIN) capsule, Take 1 capsule by mouth daily., Disp: , Rfl:  .  Red Yeast Rice Extract (RED YEAST RICE PO), Take by mouth., Disp: , Rfl:  .  VITAMIN K PO, Take by mouth., Disp: , Rfl:  .  Cholecalciferol (VITAMIN D PO), Take by mouth.  , Disp: , Rfl:  .  Estradiol (YUVAFEM) 10 MCG TABS vaginal tablet, Insert tab vaginally twice weekly at hs., Disp: 24 tablet, Rfl: 4 .  metroNIDAZOLE (METROGEL) 1 % gel, , Disp: , Rfl: 2 .  REBIF 44 MCG/0.5ML SOSY injection, INJECT 1 PREFILLED SYRINGE SUBCUTANEOUSLY 3 TIMES PER WEEK, Disp: 12 Syringe, Rfl: 11 .  SYNTHROID 100 MCG tablet, 100 mg daily., Disp: , Rfl: 0  No Known  Allergies Review of Systems Objective:   Vitals:   04/04/18 0925  BP: 114/84  Pulse: 74  Resp: 16    General: Well developed, nourished, in no acute distress, alert and oriented x3   Dermatological: Skin is warm, dry and supple bilateral. Nails x 10 are well maintained; remaining integument appears unremarkable at this time. There are no open sores, no preulcerative lesions, no rash or signs of infection present.  Reactive hyper keratomas plantar aspect of the bilateral forefoot.  Vascular: Dorsalis Pedis artery and Posterior Tibial artery pedal pulses are 2/4 bilateral with immedate capillary fill time. Pedal hair growth present. No varicosities and no lower extremity edema present bilateral.   Neruologic: Grossly intact via light touch bilateral. Vibratory intact via tuning fork bilateral. Protective threshold with Semmes Wienstein monofilament intact to all pedal sites bilateral. Patellar and Achilles deep tendon reflexes 2+ bilateral. No Babinski or clonus noted bilateral.   Musculoskeletal: No gross boney pedal deformities bilateral. No pain, crepitus, or limitation noted with foot and ankle range of motion bilateral. Muscular strength 5/5 in all groups tested bilateral.  Hammertoe deformity is flexible in nature resulting in reactive hyper keratomas plantar aspect of bilateral forefoot.  Mild tinea pedis is also noted.  Gait: Unassisted, Nonantalgic.    Radiographs:  None taken  Assessment & Plan:   Assessment: Mild tinea pedis.  Hammertoe deformity resulting in the plantar flexed metatarsals and reactive hyperkeratosis plantar aspect of the forefoot.  Plan: Recommended Lamisil AT cream.  I also debrided all reactive hyperkeratosis discussed appropriate shoe gear stretching exercise ice therapy.     Max T. [place], [place]

## 2018-04-07 ENCOUNTER — Telehealth: Payer: Self-pay | Admitting: Nurse Practitioner

## 2018-04-07 NOTE — Telephone Encounter (Signed)
Pt is having anxiety about her rebif and how it will be handled when she turns 64 in 2 years. She said [employer] has sent her a form, she is wanting to discuss this before she signs it. She is going to fax it to [facility] per protocol but she is wanting Dr Krista Blue and Sharyn Lull to handle this.   FYI

## 2018-04-10 NOTE — Telephone Encounter (Signed)
Spoke to patient - she received a medical release form from [employer].  They require this form so they are able to help secure financial assistance for her Rebif.  She verbalized understanding and will sign the form.  We are unable to answer questions concerning financial coverage for her medication in two years, when she is 89.  I did let her know that right now [employer] does also offer assistance to Medicare patients.  This information relieved her.

## 2018-05-09 ENCOUNTER — Telehealth: Payer: Self-pay | Admitting: *Deleted

## 2018-05-09 NOTE — Telephone Encounter (Signed)
PA for Rebif approved.  Valid through 05/10/2019.  KZ#[id number].

## 2018-05-09 NOTE — Telephone Encounter (Signed)
PA for Rebif started through covermymeds (Key: [id number]).  Pharmacy benefits w/ [employer] [phone]).  Pt [id number].  Decision pending.

## 2018-07-05 ENCOUNTER — Other Ambulatory Visit: Payer: Self-pay | Admitting: *Deleted

## 2018-07-05 ENCOUNTER — Other Ambulatory Visit: Payer: Self-pay | Admitting: Neurology

## 2018-07-05 MED ORDER — INTERFERON BETA-1A 44 MCG/0.5ML ~~LOC~~ SOSY: PREFILLED_SYRINGE | SUBCUTANEOUS | 11 refills | Status: DC

## 2018-07-31 ENCOUNTER — Other Ambulatory Visit: Payer: Self-pay | Admitting: Gynecology

## 2018-07-31 DIAGNOSIS — Z1231 Encounter for screening mammogram for malignant neoplasm of breast: Secondary | ICD-10-CM

## 2018-09-14 ENCOUNTER — Ambulatory Visit
Admission: RE | Admit: 2018-09-14 | Discharge: 2018-09-14 | Disposition: A | Discharge status: Home / Self Care | Payer: Commercial Managed Care - HMO | Source: Ambulatory Visit | Attending: Gynecology | Admitting: Gynecology

## 2018-09-14 DIAGNOSIS — Z1231 Encounter for screening mammogram for malignant neoplasm of breast: Secondary | ICD-10-CM

## 2018-09-15 ENCOUNTER — Other Ambulatory Visit: Payer: Self-pay | Admitting: Gynecology

## 2018-09-15 ENCOUNTER — Ambulatory Visit: Payer: 59

## 2018-09-15 DIAGNOSIS — R928 Other abnormal and inconclusive findings on diagnostic imaging of breast: Secondary | ICD-10-CM

## 2018-09-19 ENCOUNTER — Other Ambulatory Visit: Payer: Commercial Managed Care - HMO

## 2018-09-20 ENCOUNTER — Telehealth: Payer: Self-pay | Admitting: Nurse Practitioner

## 2018-09-20 ENCOUNTER — Telehealth: Payer: Self-pay | Admitting: *Deleted

## 2018-09-20 NOTE — Telephone Encounter (Signed)
Pt is needing a refill on her interferon beta-1a (REBIF) 44 MCG/0.5ML SOSY injection and sent to [employer]. Pt states it needs prior PA. Please advise.

## 2018-09-20 NOTE — Telephone Encounter (Signed)
Approved today, Reference Number: [id number]. REBIF INJ 44/0.5 is approved through 09/21/2019. For further questions, call [phone]. Approval letter successfully  faxed to [facility], [phone].

## 2018-09-20 NOTE — Telephone Encounter (Signed)
Received fax from [facility], [place] [id number] [employer] [facility] re: Rebif PA. ID: [id number]. PA sent to plan.

## 2018-09-20 NOTE — Telephone Encounter (Signed)
Called patient and advised her I got Rebif approval this morning. I advised her the approval letter was faxed to [facility], and advised her it was refilled 07/05/18 for one year.  She stated she had made a small change to her insurance and wondered if that's why it need a PA. The previous PA was good through 06/06/19. I advised her that any insurance change would generate a new PA. She verbalized understanding, appreciation of call.

## 2018-10-03 ENCOUNTER — Ambulatory Visit
Admission: RE | Admit: 2018-10-03 | Discharge: 2018-10-03 | Disposition: A | Discharge status: Home / Self Care | Payer: Commercial Managed Care - HMO | Source: Ambulatory Visit | Attending: Gynecology | Admitting: Gynecology

## 2018-10-03 ENCOUNTER — Other Ambulatory Visit: Payer: Self-pay | Admitting: Gynecology

## 2018-10-03 ENCOUNTER — Ambulatory Visit
Admission: RE | Admit: 2018-10-03 | Discharge: 2018-10-03 | Disposition: A | Discharge status: Home / Self Care | Payer: 59 | Source: Ambulatory Visit | Attending: Gynecology | Admitting: Gynecology

## 2018-10-03 DIAGNOSIS — R928 Other abnormal and inconclusive findings on diagnostic imaging of breast: Secondary | ICD-10-CM

## 2018-10-03 DIAGNOSIS — N63 Unspecified lump in unspecified breast: Secondary | ICD-10-CM

## 2018-12-28 ENCOUNTER — Encounter: Payer: 59 | Admitting: Gynecology

## 2019-01-10 ENCOUNTER — Ambulatory Visit: Payer: Self-pay | Admitting: Cardiology

## 2019-03-06 ENCOUNTER — Telehealth: Payer: Self-pay | Admitting: Neurology

## 2019-03-06 ENCOUNTER — Other Ambulatory Visit: Payer: Self-pay

## 2019-03-06 ENCOUNTER — Ambulatory Visit (INDEPENDENT_AMBULATORY_CARE_PROVIDER_SITE_OTHER): Payer: 59 | Admitting: Neurology

## 2019-03-06 ENCOUNTER — Encounter: Payer: Self-pay | Admitting: Neurology

## 2019-03-06 ENCOUNTER — Ambulatory Visit: Payer: Self-pay | Admitting: Neurology

## 2019-03-06 DIAGNOSIS — G35 Multiple sclerosis: Secondary | ICD-10-CM | POA: Diagnosis not present

## 2019-03-06 NOTE — Progress Notes (Signed)
[facility]  PATIENT: Ana Russell DOB: May 26, 1956   REASON FOR VISIT: Follow-up for multiple sclerosis relapsing remitting HISTORY FROM: Patient    HISTORY OF PRESENT ILLNESS:Mrs.Degracia is a 63 years old right-handed Caucasian female, came in to followup for relapsing remitting multiple sclerosis. 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 She only has mild gait difficulty, taking her Rebif on Monday, Wednesday, Friday, premedicated with Advil, overall tolerating the medication very well  Most recent MRI of the brain was 2012, had a repeat scan in January 20/15 his, she came in to review her MRI findings, we have seen the films together, with continued evidence of multiple supratentorium periventricular oval-shaped lesion, there was T1 black holes, no contrast enhancement,   After discussed with patient, she is overall doing well clinically fairly stable imaging-wise, she wants to stay on current treatment, She reported a history of mildly decreased white count, has been under close supervision of her primary care physician Dr. Noah Delaine at [facility], I do not have recent laboratory evaluations  UPDATE Jan 14th 2016: She is doing very well, enjoys her painting, last repeat MRI January 2015, MRI scans of brain showing extensive periventricular and subcortical white matter hyperintensities consistent with multiple sclerosis. No enhancing lesions are noted. Presence of multiple T1 black holes indicates chronic disease. 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 I have reviewed MRI of the brain with patient, there was no significant changes compared to previous scans.  She is continuing rebif Monday Wednesday Friday, premedicated with Advil, She has mild unsteady gait, using walking sticks, occasionally  Most recent laboratory showed mild elevated glucose 127, normal liver functional test, mild elevated LDL 116  UPDATE Sep 25 2015: She walks everyday 2 miles, weight training,  She is doing pull up, She walks with a stick, she has urinary urgency, mild constipation.   She noticed mild left ear tinnitus with mild left hearing loss.  Then she got right hearing loss. She still taking Rebif, tolerating well,  I reviewed laboratory in December 2016, elevated LDL 125, HDL was 73, mild elevated TSH 5.76, vitamin D was normal 50 1.7, A1c was mild elevated 6.0  Virtual Visit via Video  I connected with Hartley Wyke Ryant on 03/06/19 at  by Video and verified that I am speaking with the correct person using two identifiers.   I discussed the limitations, risks, security and privacy concerns of performing an evaluation and management service by video and the availability of in person appointments. I also discussed with the patient that there may be a patient responsible charge related to this service. The patient expressed understanding and agreed to proceed.   History of Present Illness: She is over all doing very well, there was no acute episode, no significant gait abnormality, she continue enjoying her painting, and gardening   Observations/Objective: I have reviewed problem lists, medications, allergies.  Awake, alert, oriented to history taking, and casual conversation, moving 4 extremities without difficulties, ambulate with a steady gait  Assessment and Plan: Relapsing remitting multiple sclerosis  Continue Rebif  Return to clinic in 1 year  Follow Up Instructions:  1 year with  nurse practitioner Judson Roch    I discussed the assessment and treatment plan with the patient. The patient was provided an opportunity to ask questions and all were answered. The patient agreed with the plan and demonstrated an understanding of the instructions.   The patient was advised to call back or seek an in-person evaluation if the symptoms worsen or if the condition fails to improve as anticipated.  I provided 15 minutes of non-face-to-face time during this encounter.   Marcial Pacas, MD, Ph.D

## 2019-03-06 NOTE — Telephone Encounter (Signed)
The patient was last seen in January 2019.  She has been scheduled for a video visit today with Dr. Krista Blue.  The prescription for Rebif has been received and will be addressed after her appt today.  I returned the call to [facility] to provide them with this update.

## 2019-03-06 NOTE — Telephone Encounter (Signed)
Linton Rump from [facility] called wanting to know if the pts Rebif prescription has been received and what the turnaround time would be. He states she reported new insurance.

## 2019-03-12 ENCOUNTER — Other Ambulatory Visit: Payer: Self-pay

## 2019-03-12 ENCOUNTER — Telehealth: Payer: Self-pay

## 2019-03-12 ENCOUNTER — Encounter: Payer: Self-pay | Admitting: Gynecology

## 2019-03-12 ENCOUNTER — Other Ambulatory Visit: Payer: Self-pay | Admitting: Obstetrics & Gynecology

## 2019-03-12 ENCOUNTER — Ambulatory Visit (INDEPENDENT_AMBULATORY_CARE_PROVIDER_SITE_OTHER): Payer: 59 | Admitting: Gynecology

## 2019-03-12 DIAGNOSIS — N898 Other specified noninflammatory disorders of vagina: Secondary | ICD-10-CM | POA: Diagnosis not present

## 2019-03-12 MED ORDER — METRONIDAZOLE 0.75 % VA GEL: 1.0000 | Freq: Two times a day (BID) | VAGINAL | 0 refills | Status: DC

## 2019-03-12 NOTE — Progress Notes (Signed)
    Ana Russell 09/29/1955 [id number]        63 y.o.  G2P0020 calls for telemedicine visit.  She was identified by 2 independent identifiers and she is at home and I am in my office.  She understands limitations of a telemedicine visit.  Patient notes over the last week or so vaginal irritation.  Had been on Vagifem equivalent but not using it regularly.  Started using it more regularly about a month ago.  Notes a yellowish discharge but no odor.  No urinary symptoms such as frequency dysuria urgency low back pain fever or chills.  Has a history of bacterial vaginosis in the past.  Had used a vaginal dilator and wonders whether she caused an infection.  Past medical history,surgical history, problem list, medications, allergies, family history and social history were all reviewed and documented in the EPIC chart.  Directed ROS with pertinent positives and negatives documented in the history of present illness/assessment and plan.  Exam:  Assessment/Plan:  63 y.o. G2P0020 with history as above.  Had significantly decreased her Vagifem equivalent use and noticed that her vaginal opening seemed to be closing.  Started using the Vagifem regularly and used a vaginal dilator.  Discussed differential to include possible BV or yeast with the itching although up with the yellowish discharge sounds more bacterial.  She has used MetroGel in the past with good success.  We will go ahead with MetroGel nightly x7 nights and then go from there.  If her symptoms would persist then she will call.  12 minutes of time in direct communication with the patient as well as in review of her past records and treatments was spent during this interaction.  Anastasio Auerbach MD, 4:01 PM 03/12/2019

## 2019-03-12 NOTE — Patient Instructions (Signed)
Use the MetroGel vaginal cream nightly for 7 nights.  Follow-up if symptoms persist.

## 2019-03-12 NOTE — Telephone Encounter (Signed)
Ana Russell with [facility] called to confirm how the pt receives her Rebiff 44 mcg injectio?. I was able to confirm with Ana Russell that pt receives medication through [facility]. Ana Russell verbalized understanding and had no further questions.

## 2019-03-13 ENCOUNTER — Telehealth: Payer: Self-pay | Admitting: *Deleted

## 2019-03-13 MED ORDER — METRONIDAZOLE 0.75 % VA GEL: 1.0000 | Freq: Every day | VAGINAL | 0 refills | Status: DC

## 2019-03-13 NOTE — Telephone Encounter (Signed)
Patient called because Rx from [facility] on 03/12/19 was sent to incorrect pharmacy. Rx for metrogel 0.75% sent to correct pharmacy.

## 2019-04-04 ENCOUNTER — Other Ambulatory Visit: Payer: 59

## 2019-04-09 ENCOUNTER — Other Ambulatory Visit: Payer: Self-pay

## 2019-04-11 ENCOUNTER — Telehealth: Payer: Self-pay | Admitting: *Deleted

## 2019-04-11 NOTE — Telephone Encounter (Signed)
Received fax notification from [employer] that PA already completed/approved 09/20/2018-09/21/2019. [id number]. Questions? [id number]

## 2019-04-11 NOTE — Telephone Encounter (Signed)
Submitted PA Rebif on CMM. CCE:[id number]. PA Case ID: [id number]. Waiting on determination from [employer].

## 2019-04-12 ENCOUNTER — Encounter: Payer: Self-pay | Admitting: Gynecology

## 2019-04-12 ENCOUNTER — Other Ambulatory Visit: Payer: Self-pay

## 2019-04-12 ENCOUNTER — Ambulatory Visit (INDEPENDENT_AMBULATORY_CARE_PROVIDER_SITE_OTHER): Payer: 59 | Admitting: Gynecology

## 2019-04-12 VITALS — BP 132/78 | Ht 64.5 in | Wt 114.0 lb

## 2019-04-12 DIAGNOSIS — N952 Postmenopausal atrophic vaginitis: Secondary | ICD-10-CM | POA: Diagnosis not present

## 2019-04-12 DIAGNOSIS — N898 Other specified noninflammatory disorders of vagina: Secondary | ICD-10-CM | POA: Diagnosis not present

## 2019-04-12 DIAGNOSIS — Z13220 Encounter for screening for lipoid disorders: Secondary | ICD-10-CM | POA: Diagnosis not present

## 2019-04-12 DIAGNOSIS — M810 Age-related osteoporosis without current pathological fracture: Secondary | ICD-10-CM | POA: Diagnosis not present

## 2019-04-12 DIAGNOSIS — Z1151 Encounter for screening for human papillomavirus (HPV): Secondary | ICD-10-CM

## 2019-04-12 DIAGNOSIS — Z01419 Encounter for gynecological examination (general) (routine) without abnormal findings: Secondary | ICD-10-CM

## 2019-04-12 LAB — WET PREP FOR TRICH, YEAST, CLUE

## 2019-04-12 MED ORDER — FLUCONAZOLE 150 MG PO TABS: 150.0000 mg | ORAL_TABLET | Freq: Once | ORAL | 0 refills | Status: AC

## 2019-04-12 MED ORDER — ESTRADIOL 10 MCG VA TABS: ORAL_TABLET | VAGINAL | 4 refills | Status: DC

## 2019-04-12 MED ORDER — ALENDRONATE SODIUM 70 MG PO TABS: 70.0000 mg | ORAL_TABLET | ORAL | 11 refills | Status: DC

## 2019-04-12 NOTE — Progress Notes (Signed)
    Ana Russell Oct 26, 1955 [id number]        63 y.o.  G2P0020 for annual gynecologic exam.  Also complaining of vaginal discharge with irritation.  Was seen earlier and treated for bacterial vaginosis.  Her symptoms persisted and she subsequently started Monistat OTC last week.  Notes her symptoms seem better but not totally gone.  Past medical history,surgical history, problem list, medications, allergies, family history and social history were all reviewed and documented as reviewed in the EPIC chart.  ROS:  Performed with pertinent positives and negatives included in the history, assessment and plan.   Additional significant findings : None   Exam: Copywriter, advertising Vitals:   04/12/19 0827  BP: 132/78  Weight: 114 lb (51.7 kg)  Height: 5' 4.5" (1.638 m)   Body mass index is 19.27 kg/m.  General appearance:  Normal affect, orientation and appearance. Skin: Grossly normal HEENT: Without gross lesions.  No cervical or supraclavicular adenopathy. Thyroid normal.  Lungs:  Clear without wheezing, rales or rhonchi Cardiac: RR, without RMG Abdominal:  Soft, nontender, without masses, guarding, rebound, organomegaly or hernia Breasts:  Examined lying and sitting without masses, retractions, discharge or axillary adenopathy. Pelvic:  Ext, BUS, Vagina: With atrophic changes.  Scant white discharge.  Cervix: With atrophic changes.  Pap smear/HPV  Uterus: Anteverted, normal size, shape and contour, midline and mobile nontender   Adnexa: Without masses or tenderness    Anus and perineum: Normal   Rectovaginal: Normal sphincter tone without palpated masses or tenderness.    Assessment/Plan:  63 y.o. G80P0020 female for annual gynecologic exam.   1. Vaginal discharge.  Wet prep is negative.  Recently used Monistat with some improvement but not totally gone.  Was treated for bacterial vaginosis with MetroGel but symptoms have persisted despite this.  Will cover for yeast with Diflucan  150 mg x 3 days.  Follow-up if symptoms persist, worsen or recur. 2. Osteoporosis.  DEXA 2019 T score -2.7.  Had previously discussed treatment options and she declined.  We again discussed osteoporosis and her fracture risk.  We discussed treatment options.  At this point the patient would like to go ahead and start alendronate.  I discussed the side effects to include GERD.  I discussed how to take the medication.  More significant risks to include osteonecrosis of the jaw and atypical fractures also reviewed.  We will go ahead and start alendronate 70 mg weekly.  Will call if she has any issues with this.  We will plan follow-up DEXA next year at 2-year interval. 3. Postmenopausal/atrophic genital changes.  Using [facility] for atrophic changes with good relief of her symptoms.  We again discussed the risks of vaginal estrogen to include absorption with systemic effects such as breast, thrombosis and endometrial stimulation.  The patient is comfortable continue at this point and refill x1 year provided. 4. Pap smear/HPV 2015.  Pap smear/HPV today.  No history of abnormal Pap smears previously. 5. Colonoscopy 2018.  Repeat at their recommended interval. 6. Health maintenance.  Requests baseline labs.  CBC, CMP, lipid profile, TSH and vitamin D ordered.  Follow-up 1 year, sooner as needed.  Additional time was spent above her annual exam discussing her osteoporosis, treatment options and ultimate prescription as well as her vaginal discharge with prescription provided.   Anastasio Auerbach MD, 9:21 AM 04/12/2019

## 2019-04-12 NOTE — Patient Instructions (Signed)
Take the Diflucan pill daily for 3 days to treat the yeast infection.  Call if your symptoms persist.  Start on the alendronate (Fosamax).  Call if you have any issues with this.

## 2019-04-12 NOTE — Addendum Note (Signed)
Addended by: Thurnell Garbe A on: 04/12/2019 11:36 AM   Modules accepted: Orders

## 2019-04-13 ENCOUNTER — Encounter: Payer: Self-pay | Admitting: Gynecology

## 2019-04-13 LAB — CBC WITH DIFFERENTIAL/PLATELET
Absolute Monocytes: 605 cells/uL (ref 200–950)
Basophils Absolute: 22 cells/uL (ref 0–200)
Basophils Relative: 0.6 %
Eosinophils Absolute: 108 cells/uL (ref 15–500)
Eosinophils Relative: 3 %
HCT: 40.4 % (ref 35.0–45.0)
Hemoglobin: 13.3 g/dL (ref 11.7–15.5)
Lymphs Abs: 961 cells/uL (ref 850–3900)
MCH: 29.8 pg (ref 27.0–33.0)
MCHC: 32.9 g/dL (ref 32.0–36.0)
MCV: 90.6 fL (ref 80.0–100.0)
MPV: 11.3 fL (ref 7.5–12.5)
Monocytes Relative: 16.8 %
Neutro Abs: 1904 cells/uL (ref 1500–7800)
Neutrophils Relative %: 52.9 %
Platelets: 179 10*3/uL (ref 140–400)
RBC: 4.46 10*6/uL (ref 3.80–5.10)
RDW: 12.1 % (ref 11.0–15.0)
Total Lymphocyte: 26.7 %
WBC: 3.6 10*3/uL — ABNORMAL LOW (ref 3.8–10.8)

## 2019-04-13 LAB — PAP, TP IMAGING W/ HPV RNA, RFLX HPV TYPE 16,18/45: HPV DNA High Risk: NOT DETECTED

## 2019-04-13 LAB — COMPREHENSIVE METABOLIC PANEL
AG Ratio: 1.7 (calc) (ref 1.0–2.5)
ALT: 17 U/L (ref 6–29)
AST: 22 U/L (ref 10–35)
Albumin: 4.4 g/dL (ref 3.6–5.1)
Alkaline phosphatase (APISO): 54 U/L (ref 37–153)
BUN: 18 mg/dL (ref 7–25)
CO2: 28 mmol/L (ref 20–32)
Calcium: 9.6 mg/dL (ref 8.6–10.4)
Chloride: 102 mmol/L (ref 98–110)
Creat: 0.8 mg/dL (ref 0.50–0.99)
Globulin: 2.6 g/dL (calc) (ref 1.9–3.7)
Glucose, Bld: 95 mg/dL (ref 65–99)
Potassium: 4 mmol/L (ref 3.5–5.3)
Sodium: 139 mmol/L (ref 135–146)
Total Bilirubin: 0.4 mg/dL (ref 0.2–1.2)
Total Protein: 7 g/dL (ref 6.1–8.1)

## 2019-04-13 LAB — LIPID PANEL
Cholesterol: 207 mg/dL — ABNORMAL HIGH (ref <200)
HDL: 75 mg/dL (ref >=50)
LDL Cholesterol (Calc): 115 mg/dL (calc) — ABNORMAL HIGH
Non-HDL Cholesterol (Calc): 132 mg/dL (calc) — ABNORMAL HIGH (ref <130)
Total CHOL/HDL Ratio: 2.8 (calc) (ref <5.0)
Triglycerides: 80 mg/dL (ref <150)

## 2019-04-13 LAB — TSH: TSH: 0.98 mIU/L (ref 0.40–4.50)

## 2019-04-13 LAB — VITAMIN D 25 HYDROXY (VIT D DEFICIENCY, FRACTURES): Vit D, 25-Hydroxy: 49 ng/mL (ref 30–100)

## 2019-04-30 ENCOUNTER — Other Ambulatory Visit: Payer: Self-pay | Admitting: Neurology

## 2019-06-05 ENCOUNTER — Encounter: Payer: Self-pay | Admitting: Gynecology

## 2019-08-08 ENCOUNTER — Other Ambulatory Visit: Payer: Self-pay

## 2019-08-08 ENCOUNTER — Ambulatory Visit
Admission: RE | Admit: 2019-08-08 | Discharge: 2019-08-08 | Disposition: A | Discharge status: Home / Self Care | Payer: 59 | Source: Ambulatory Visit | Attending: Gynecology | Admitting: Gynecology

## 2019-08-08 DIAGNOSIS — N63 Unspecified lump in unspecified breast: Secondary | ICD-10-CM

## 2019-09-03 ENCOUNTER — Telehealth: Payer: Self-pay | Admitting: *Deleted

## 2019-09-03 NOTE — Telephone Encounter (Signed)
PA for Rebif 39mcg approved by [employer] [phone]).  Pt ID: [id number].  [id number]. Covermymeds key: [id number].  Valid through 09/02/2020.

## 2019-09-20 ENCOUNTER — Other Ambulatory Visit: Payer: 59

## 2019-09-21 ENCOUNTER — Telehealth: Payer: Self-pay | Admitting: Neurology

## 2019-09-21 NOTE — Telephone Encounter (Signed)
Per Dr. Krista Blue, no contraindication w/ her MS for her to get the COVID-19 vaccination.  It is okay for her to get it.  I was unable to reach her but left this message on her husband's voicemail (ok per DPR).

## 2019-09-21 NOTE — Telephone Encounter (Signed)
Pt would like to know what Dr Krista Blue has to say about her getting the Covid-19 vaccine.  Please call

## 2019-10-22 ENCOUNTER — Other Ambulatory Visit: Payer: Self-pay | Admitting: Internal Medicine

## 2019-10-22 DIAGNOSIS — Z1231 Encounter for screening mammogram for malignant neoplasm of breast: Secondary | ICD-10-CM

## 2019-10-26 ENCOUNTER — Telehealth: Payer: Self-pay | Admitting: *Deleted

## 2019-10-26 NOTE — Telephone Encounter (Signed)
Patient called stating Ana Russell 10 mcg vaginal tabs will no longer be covered under medicare. Medicare did provider her with a list of formulary drugs, she will bring that list to the office and I will forward name to provider.

## 2019-11-05 ENCOUNTER — Telehealth: Payer: Self-pay | Admitting: Neurology

## 2019-11-05 NOTE — Telephone Encounter (Signed)
I called her back again.  She is aware to skip her Rebif injection today and restart it on Wednesday.  She would like the letter sent to her mychart acct so she can print it prior to her appt this afternoon. This request has been completed.

## 2019-11-05 NOTE — Telephone Encounter (Signed)
Pt has called stating that her husband really wants her to get the Covid-19 vaccine, she has an appointment today.  Pt states that her [birth year] is months [birth year] and she would like Dr Krista Blue to prepare a letter for her to be able to get the vaccine this afternoon.  Pt was informed RN's are permitted 48 hours to respond to messages and that it could in no way be guaranteed that Dr Krista Blue would be able to process this request for her before the time of her appointment today.

## 2019-11-05 NOTE — Telephone Encounter (Signed)
Ok, per Dr. Krista Blue, to provide the letter. The patient also reported taking her Rebif injections on Monday, Wednesday and Friday. She wanted to know if she should skip today since she is getting the vaccination. Per vo by Dr. Krista Blue, skip today's injection and restart on Wednesday.

## 2019-11-19 ENCOUNTER — Other Ambulatory Visit: Payer: Self-pay

## 2019-11-19 MED ORDER — ESTRADIOL 10 MCG VA TABS: ORAL_TABLET | VAGINAL | 1 refills | Status: DC

## 2019-11-21 ENCOUNTER — Other Ambulatory Visit: Payer: Self-pay

## 2019-11-21 MED ORDER — ESTRADIOL 10 MCG VA TABS: ORAL_TABLET | VAGINAL | 1 refills | Status: DC

## 2019-12-07 ENCOUNTER — Telehealth: Payer: Self-pay | Admitting: Neurology

## 2019-12-07 NOTE — Telephone Encounter (Addendum)
The covermymeds case for Rebif has been completed and approved through 09/05/20. [id number]. Pt has coverage through [employer] [phone]). [facility] will be notified of approval.

## 2019-12-07 NOTE — Telephone Encounter (Signed)
Caryl Pina @ [place] said pt has new insurance and a PA is needed for REBIF 44 MCG/0.5ML SOSY injection the Cover My Meds Key Code is :[id number]

## 2019-12-12 ENCOUNTER — Telehealth: Payer: Self-pay | Admitting: *Deleted

## 2019-12-12 NOTE — Telephone Encounter (Signed)
PA done via cover my meds for generic yuvafem 10 mcg tablets. Medication denied stating no on formulary drug plan, patient must try of fail formulary drug such as estradiol vaginal cream, estring, or femring or premarin vaginal cream.  Patient informed with all the above, she is going to use the remaining yuvafem tablets she has left and she if she can stop using them. If she does need medication she will call and more than likely she will proceed with estradiol vaginal cream Rx.

## 2019-12-17 MED ORDER — ESTRADIOL 0.1 MG/GM VA CREA: TOPICAL_CREAM | VAGINAL | 1 refills | Status: DC

## 2019-12-17 NOTE — Telephone Encounter (Signed)
Rx sent 

## 2019-12-17 NOTE — Telephone Encounter (Signed)
Yes, that would be great. Thank you.

## 2019-12-17 NOTE — Telephone Encounter (Signed)
Dr.Kendall patient insurance will no longer pay for generic yuvafem 10 mcg tablets, she will need to try/fail a formulary drugs before they will pay for yuvafem. Patient would like to try the estradiol (estrace) vaginal cream, okay to send Rx?

## 2019-12-31 ENCOUNTER — Telehealth: Payer: Self-pay | Admitting: Neurology

## 2019-12-31 NOTE — Telephone Encounter (Signed)
I spoke to the patient. She has been on Rebif for twelve years and is developing injection fatigue. She wants to come in to discuss other medication options. She is tired of giving herself shots and is having issues with some of her injection sites. She has stopped using her arms because she is so thin now and it hurts. She is still able to inject her bilateral legs and stomach.  She has scheduled an appt with Dr. Krista Blue.

## 2019-12-31 NOTE — Telephone Encounter (Signed)
Pt requesting a call to discuss REBIF 44 MCG/0.5ML SOSY injection stating she is having a hard time getting the injection to go in and would like to talk about starting a new medication. Please call to advise

## 2020-01-17 ENCOUNTER — Other Ambulatory Visit: Payer: Self-pay

## 2020-01-17 ENCOUNTER — Encounter: Payer: Self-pay | Admitting: Neurology

## 2020-01-17 ENCOUNTER — Ambulatory Visit (INDEPENDENT_AMBULATORY_CARE_PROVIDER_SITE_OTHER): Payer: Medicare Other | Admitting: Neurology

## 2020-01-17 VITALS — BP 122/80 | HR 82 | Temp 96.9°F | Ht 64.5 in | Wt 116.0 lb

## 2020-01-17 DIAGNOSIS — G35 Multiple sclerosis: Secondary | ICD-10-CM | POA: Diagnosis not present

## 2020-01-17 NOTE — Progress Notes (Signed)
PATIENT: Ana Russell DOB: 1956/07/24  Chief Complaint  Patient presents with  . Follow-up    Rm 4-- here to dicuss other MS med options. Pt reports the rebiff has caused knotts to form on the upper back side of her arm.      HISTORICAL  PATIENT: Ana Russell DOB: 29-Oct-1955   HISTORY OF PRESENT ILLNESS:Ana Russell is a 64 years old right-handed Caucasian female, came in to followup for relapsing remitting multiple sclerosis. 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 She only has mild gait difficulty, taking her Rebif on Monday, Wednesday, Friday, premedicated with Advil, overall tolerating the medication very well  Most recent MRI of the brain was 2012, had a repeat scan in January 20/15 his, she came in to review her MRI findings, we have seen the films together, with continued evidence of multiple supratentorium periventricular oval-shaped lesion, there was T1 black holes, no contrast enhancement,   After discussed with patient, she is overall doing well clinically fairly stable imaging-wise, she wants to stay on current treatment, She reported a history of mildly decreased white count, has been under close supervision of her primary care physician Dr. Noah Delaine at [facility], I do not have recent laboratory evaluations  UPDATE Jan 14th 2016: She is doing very well, enjoys her painting, last repeat MRI January 2015, MRI scans of brain showing extensive periventricular and subcortical white matter hyperintensities consistent with multiple  sclerosis. No enhancing lesions are noted. Presence of multiple T1 black holes indicates chronic disease. 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 I have reviewed MRI of the brain with patient, there was no significant changes compared to previous scans.  She is continuing rebif Monday Wednesday Friday, premedicated with Advil, She has mild unsteady gait, using walking sticks, occasionally  Most recent laboratory showed mild elevated glucose 127, normal liver functional test, mild elevated LDL 116  UPDATE Sep 25 2015: She walks everyday 2 miles, weight training,  She is doing pull up, She walks with a stick, she has urinary urgency, mild constipation.   She noticed mild left ear tinnitus with mild left hearing loss.  Then she got right hearing loss. She still taking Rebif, tolerating well,  I reviewed laboratory in December 2016, elevated LDL 125, HDL was 73, mild elevated TSH 5.76, vitamin D was normal 50 1.7, A1c was mild elevated 6.0  UPDATE Jan 18 2020: Ana Russell has been doing very well, but complains of needle fatigue, subcutaneous nodule with her repeat Rebif injection, continue has mild reaction following each injection, also consider other MS medication choices  She enjoys gardening, painting, mild unsteady gait, carry a cane, but denies significant limitations  We personally reviewed most recent MRI of the brain with without contrast in March 2017, moderate plaque load, no contrast-enhancement, no change compared to previous scan in 2000 09  REVIEW OF SYSTEMS: Full 14 system review of systems performed and notable only for as above All other review of systems were negative.  ALLERGIES: No Known Allergies  HOME MEDICATIONS: Current Outpatient Medications  Medication Sig  Dispense Refill  . Ascorbic Acid (VITAMIN C PO) Take by mouth.    . Cholecalciferol (VITAMIN D PO) Take by mouth.      . estradiol (ESTRACE VAGINAL) 0.1 MG/GM vaginal cream  Insert 1 gram vaginally twice weekly 42.5 g 1  . metroNIDAZOLE (METROGEL) 1 % gel   2  . REBIF 44 MCG/0.5ML SOSY injection INJECT 44MCG SUBCUTANEOUSLY THREE TIMES A WEEK AT LEAST 48 HOURS APART 6 mL 11  . SYNTHROID 100 MCG tablet 100 mg daily.  0   No current facility-administered medications for this visit.    PAST MEDICAL HISTORY: Past Medical History:  Diagnosis Date  . C. difficile colitis 2013  . Endometrial hyperplasia without atypia, simple 09/2003   NORMAL ENDOMETRIAL BIOPSY 03/2005  . Fluid retention    in ears  . Hypothyroidism   . Meniere disease   . MS (multiple sclerosis) ([place]) 02/25/2011  . Osteoporosis 01/2018   T score -2.7    PAST SURGICAL HISTORY: Past Surgical History:  Procedure Laterality Date  . DILATION AND CURETTAGE OF UTERUS     TAB  . HYSTEROSCOPY  09/2003   POLYPS  . KNEE SURGERY  2011   bilateral knee scopes  . PELVIC LAPAROSCOPY    . THYROIDECTOMY, PARTIAL      FAMILY HISTORY: Family History  Problem Relation Age of Onset  . Diabetes Mother   . Diabetes Paternal Grandfather   . Cancer Sister        Thyroid  . Heart attack Sister   . Breast cancer Maternal Aunt     SOCIAL HISTORY: Social History   Socioeconomic History  . Marital status: Married    Spouse name: Ed  . Number of children: 0  . Years of education: Bachelors  . Highest education level: Not on file  Occupational History  . Not on file  Tobacco Use  . Smoking status: Former Research scientist (life sciences)  . Smokeless tobacco: Never Used  Substance and Sexual Activity  . Alcohol use: Yes    Alcohol/week: 0.0 standard drinks    Comment: TUES THURS SAT SUN WINE  . Drug use: No  . Sexual activity: Yes    Birth control/protection: Post-menopausal    Comment: 1st intercourse 42 yo-5 partners  Other Topics Concern  . Not on file  Social History Narrative   Patient lives at home with husband (Ed)   Patient is right handed   Education level Bachelors degree   Caffeine consumption is 3  cups daily   Social Determinants of Health   Financial Resource Strain:   . Difficulty of Paying Living Expenses:   Food Insecurity:   . Worried About Charity fundraiser in the Last Year:   . Arboriculturist in the Last Year:   Transportation Needs:   . Film/video editor (Medical):   Marland Kitchen Lack of Transportation (Non-Medical):   Physical Activity:   . Days of Exercise per Week:   . Minutes of Exercise per Session:   Stress:   . Feeling of Stress :   Social Connections:   . Frequency of Communication with Friends and Family:   . Frequency of Social Gatherings with Friends and Family:   . Attends Religious Services:   . Active Member of Clubs or Organizations:   . Attends Archivist Meetings:   Marland Kitchen Marital Status:   Intimate Partner Violence:   . Fear of Current or Ex-Partner:   . Emotionally Abused:   Marland Kitchen Physically Abused:   .  Sexually Abused:      PHYSICAL EXAM   Vitals:   01/17/20 1559  BP: 122/80  Pulse: 82  Temp: (!) 96.9 F (36.1 C)  TempSrc: Temporal  SpO2: 99%  Weight: 116 lb (52.6 kg)  Height: 5' 4.5" (1.638 m)    Not recorded      Body mass index is 19.6 kg/m.  PHYSICAL EXAMNIATION:  Gen: NAD, conversant, well nourised, well groomed                     Cardiovascular: Regular rate rhythm, no peripheral edema, warm, nontender. Eyes: Conjunctivae clear without exudates or hemorrhage Neck: Supple, no carotid bruits. Pulmonary: Clear to auscultation bilaterally   NEUROLOGICAL EXAM:  MENTAL STATUS: Speech:    Speech is normal; fluent and spontaneous with normal comprehension.  Cognition:     Orientation to time, place and person     Normal recent and remote memory     Normal Attention span and concentration     Normal Language, naming, repeating,spontaneous speech     Fund of knowledge   CRANIAL NERVES: CN II: Visual fields are full to confrontation. Pupils are round equal and briskly reactive to light. CN III, IV, VI: extraocular  movement are normal. No ptosis. CN V: Facial sensation is intact to light touch CN VII: Face is symmetric with normal eye closure  CN VIII: Hearing is normal to causal conversation. CN IX, X: Phonation is normal. CN XI: Head turning and shoulder shrug are intact  MOTOR: There is no pronator drift of out-stretched arms. Muscle bulk and tone are normal. Muscle strength is normal.  REFLEXES: Reflexes are 3 and symmetric at the biceps, triceps, knees, and ankles. Plantar responses are flexor.  SENSORY: Intact to light touch, pinprick and vibratory sensation are intact in fingers and toes.  COORDINATION: There is no trunk or limb dysmetria noted.  GAIT/STANCE: She needs to push up to get up from seated position, cautious, mildly unsteady  DIAGNOSTIC DATA (LABS, IMAGING, TESTING) - I reviewed patient records, labs, notes, testing and imaging myself where available.   ASSESSMENT AND PLAN  Ana Russell is a 64 y.o. female   Relapsing remitting multiple sclerosis  Stable clinical, and imaging wise,  Has been treated with Rebif since 2009,  She has side effect with needle injection, hope to consider p.o. medication, I have recommended about Tecfidera, Gilenya, aubagio, Mayzent  She will do more research through [employer], return to clinic after repeat MRIs of brain with without contrast to discuss treatment options   Marcial Pacas, M.D. Ph.D.  [facility] [address], [place], [place] [address] Ph: [phone] Fax: [phone]  CC: Referring Provider

## 2020-01-18 ENCOUNTER — Encounter: Payer: Self-pay | Admitting: Neurology

## 2020-01-21 ENCOUNTER — Telehealth: Payer: Self-pay | Admitting: Neurology

## 2020-01-21 NOTE — Telephone Encounter (Signed)
Medicare order faxed to [facility] they will reach out to the patient to schedule. No auth.

## 2020-01-22 NOTE — Telephone Encounter (Signed)
Patient is scheduled at [facility] for 02/01/20.

## 2020-02-27 ENCOUNTER — Other Ambulatory Visit: Payer: Self-pay

## 2020-02-27 ENCOUNTER — Ambulatory Visit (INDEPENDENT_AMBULATORY_CARE_PROVIDER_SITE_OTHER): Payer: Medicare Other | Admitting: Neurology

## 2020-02-27 ENCOUNTER — Encounter: Payer: Self-pay | Admitting: *Deleted

## 2020-02-27 ENCOUNTER — Other Ambulatory Visit: Payer: Self-pay | Admitting: *Deleted

## 2020-02-27 ENCOUNTER — Encounter: Payer: Self-pay | Admitting: Neurology

## 2020-02-27 VITALS — BP 117/71 | HR 79 | Ht 64.5 in | Wt 118.0 lb

## 2020-02-27 DIAGNOSIS — G35 Multiple sclerosis: Secondary | ICD-10-CM | POA: Diagnosis not present

## 2020-02-27 DIAGNOSIS — R269 Unspecified abnormalities of gait and mobility: Secondary | ICD-10-CM | POA: Insufficient documentation

## 2020-02-27 NOTE — Progress Notes (Signed)
PATIENT: Ana Russell DOB: May 19, 1956  Chief Complaint  Patient presents with  . Multiple Sclerosis    She is here with her husband, Ana Russell. They would like to review her MRI. She is currently still using Rebif but would like to discuss coming off the medication.      HISTORICAL  PATIENT: Ana Russell DOB: 09-04-1956   HISTORY OF PRESENT ILLNESS:Ana Russell is a 64 years old right-handed Caucasian female, came in to followup for relapsing remitting multiple sclerosis. 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 She only has mild gait difficulty, taking her Rebif on Monday, Wednesday, Friday, premedicated with Advil, overall tolerating the medication very well  Most recent MRI of the brain was 2012, had a repeat scan in January 20/15 his, she came in to review her MRI findings, we have seen the films together, with continued evidence of multiple supratentorium periventricular oval-shaped lesion, there was T1 black holes, no contrast enhancement,   After discussed with patient, she is overall doing well clinically fairly stable imaging-wise, she wants to stay on current treatment, She reported a history of mildly decreased white count, has been under close supervision of her primary care physician Dr. Noah Delaine at [facility], I do not have recent laboratory evaluations  UPDATE Jan 14th 2016: She is doing very well, enjoys her painting, last repeat MRI January 2015, MRI scans of brain showing extensive periventricular and subcortical white matter  hyperintensities consistent with multiple sclerosis. No enhancing lesions are noted. Presence of multiple T1 black holes indicates chronic disease. 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 I have reviewed MRI of the brain with patient, there was no significant changes compared to previous scans.  She is continuing rebif Monday Wednesday Friday, premedicated with Advil, She has mild unsteady gait, using walking sticks, occasionally  Most recent laboratory showed mild elevated glucose 127, normal liver functional test, mild elevated LDL 116  UPDATE Sep 25 2015: She walks everyday 2 miles, weight training,  She is doing pull up, She walks with a stick, she has urinary urgency, mild constipation.   She noticed mild left ear tinnitus with mild left hearing loss.  Then she got right hearing loss. She still taking Rebif, tolerating well,  I reviewed laboratory in December 2016, elevated LDL 125, HDL was 73, mild elevated TSH 5.76, vitamin D was normal 50 1.7, A1c was mild elevated 6.0  UPDATE Jan 18 2020: Ana Russell has been doing very well, but complains of needle fatigue, subcutaneous nodule with her repeat Rebif injection, continue has mild reaction following each injection, also consider other MS medication choices  She enjoys gardening, painting, mild unsteady gait, carry a cane, but denies significant limitations  We personally reviewed most recent MRI of the brain with without contrast in March 2017, moderate plaque load, no contrast-enhancement, no change compared to previous scan in 2000 09  Update 02/27/2020 She is accompanied by her husband at today's clinical visit, we personally reviewed MRI of the brain with without contrast from [place] on 02/01/2020, continued evidence of extensive chronic demyelinating  plaque, and atrophy along corpus callosum, there was no significant change compared to previous MRI scan in 2017, there was no  contrast-enhancement  She is tired of frequent repeat Rebif injection, run out of injection site,  We had extensive discussion, with her significant lesion load, mild unsteady gait, husband also reported mental cloudiness occasionally, she should stay on long-term immunomodulation therapy  We  visited [place] [place] website, offered few choices, decided to start it Aubagio  REVIEW OF SYSTEMS: Full 14 system review of systems performed and notable only for as above All other review of systems were negative.  ALLERGIES: No Known Allergies  HOME MEDICATIONS: Current Outpatient Medications  Medication Sig Dispense Refill  . Ascorbic Acid (VITAMIN C PO) Take by mouth.    . Cholecalciferol (VITAMIN D PO) Take by mouth.      . estradiol (ESTRACE VAGINAL) 0.1 MG/GM vaginal cream Insert 1 gram vaginally twice weekly 42.5 g 1  . metroNIDAZOLE (METROGEL) 1 % gel   2  . REBIF 44 MCG/0.5ML SOSY injection INJECT 44MCG SUBCUTANEOUSLY THREE TIMES A WEEK AT LEAST 48 HOURS APART 6 mL 11  . SYNTHROID 100 MCG tablet 100 mg daily.  0   No current facility-administered medications for this visit.    PAST MEDICAL HISTORY: Past Medical History:  Diagnosis Date  . C. difficile colitis 2013  . Endometrial hyperplasia without atypia, simple 09/2003   NORMAL ENDOMETRIAL BIOPSY 03/2005  . Fluid retention    in ears  . Hypothyroidism   . Meniere disease   . MS (multiple sclerosis) ([facility]) 02/25/2011  . Osteoporosis 01/2018   T score -2.7    PAST SURGICAL HISTORY: Past Surgical History:  Procedure Laterality Date  . DILATION AND CURETTAGE OF UTERUS     TAB  . HYSTEROSCOPY  09/2003   POLYPS  . KNEE SURGERY  2011   bilateral knee scopes  . PELVIC LAPAROSCOPY    . THYROIDECTOMY, PARTIAL      FAMILY HISTORY: Family History  Problem Relation Age of Onset  . Diabetes Mother   . Diabetes Paternal Grandfather   . Cancer Sister        Thyroid  . Heart attack Sister   . Breast cancer Maternal  Aunt     SOCIAL HISTORY: Social History   Socioeconomic History  . Marital status: Married    Spouse name: Ed  . Number of children: 0  . Years of education: Bachelors  . Highest education level: Not on file  Occupational History  . Not on file  Tobacco Use  . Smoking status: Former Research scientist (life sciences)  . Smokeless tobacco: Never Used  Vaping Use  . Vaping Use: Never used  Substance and Sexual Activity  . Alcohol use: Yes    Alcohol/week: 0.0 standard drinks    Comment: TUES THURS SAT SUN WINE  . Drug use: No  . Sexual activity: Yes    Birth control/protection: Post-menopausal    Comment: 1st intercourse 57 yo-5 partners  Other Topics Concern  . Not on file  Social History Narrative   Patient lives at home with husband (Ed)   Patient is right handed   Education level Bachelors degree   Caffeine consumption is 3 cups daily   Social Determinants of Health   Financial Resource Strain:   . Difficulty of Paying Living Expenses:   Food Insecurity:   . Worried About Charity fundraiser in the Last Year:   . Burden in the Last Year:  Transportation Needs:   . Film/video editor (Medical):   Marland Kitchen Lack of Transportation (Non-Medical):   Physical Activity:   . Days of Exercise per Week:   . Minutes of Exercise per Session:   Stress:   . Feeling of Stress :   Social Connections:   . Frequency of Communication with Friends and Family:   . Frequency of Social Gatherings with Friends and Family:   . Attends Religious Services:   . Active Member of Clubs or Organizations:   . Attends Archivist Meetings:   Marland Kitchen Marital Status:   Intimate Partner Violence:   . Fear of Current or Ex-Partner:   . Emotionally Abused:   Marland Kitchen Physically Abused:   . Sexually Abused:      PHYSICAL EXAM   Vitals:   02/27/20 0717  BP: 117/71  Pulse: 79  Weight: 118 lb (53.5 kg)  Height: 5' 4.5" (1.638 m)   Not recorded     Body mass index is 19.94 kg/m.  PHYSICAL  EXAMNIATION:  Gen: NAD, conversant, well nourised, well groomed                      NEUROLOGICAL EXAM:  MENTAL STATUS: Speech/cognition: Awake alert, oriented to history taking care of conversation CRANIAL NERVES: CN II: Visual fields are full to confrontation. Pupils are round equal and briskly reactive to light. CN III, IV, VI: extraocular movement are normal. No ptosis. CN V: Facial sensation is intact to light touch CN VII: Face is symmetric with normal eye closure  CN VIII: Hearing is normal to causal conversation. CN IX, X: Phonation is normal. CN XI: Head turning and shoulder shrug are intact  MOTOR: Mild spasticity of bilateral lower extremity muscles, no significant muscle weakness  REFLEXES: Reflexes are 3 and symmetric at the biceps, triceps, knees, and ankles. Plantar responses are flexor.  SENSORY: Intact to light touch,.  COORDINATION: There is no trunk or limb dysmetria noted.  GAIT/STANCE: She needs to push up to get up from seated position, cautious, mildly unsteady  DIAGNOSTIC DATA (LABS, IMAGING, TESTING) - I reviewed patient records, labs, notes, testing and imaging myself where available.   ASSESSMENT AND PLAN  Vangie Henthorn is a 64 y.o. female   Relapsing remitting multiple sclerosis  Stable clinical, and imaging wise,  Has been treated with Rebif since 2009,  She has side effect with needle injection  We had extensive discussion with patient, with her significant lesion load, mild gait abnormality, occasionally mental cloudiness, she would still benefit long-term immunomodulation therapy,  Revisited [employer] website, offered her choice, we decided on Aubagio  Laboratory evaluations  Start preauthorization for Aubagio, she preferred to stay on 7 mg daily, once it is approved, she should stop Rebif, washout period could be 0 to 2 weeks  Return to clinic with Judson Roch in 3 months, repeat laboratory evaluation CBC with differentiation,  CMP, TSH for new start of Gertie Fey, M.D. Ph.D.  [facility] [address] [place], [place] [address] Ph: [phone] Fax: [phone]  CC: Referring Provider

## 2020-02-28 ENCOUNTER — Encounter: Payer: Self-pay | Admitting: Neurology

## 2020-03-01 LAB — COMPREHENSIVE METABOLIC PANEL
ALT: 18 IU/L (ref 0–32)
AST: 29 IU/L (ref 0–40)
Albumin/Globulin Ratio: 1.8 (ref 1.2–2.2)
Albumin: 4.4 g/dL (ref 3.8–4.8)
Alkaline Phosphatase: 58 IU/L (ref 48–121)
BUN/Creatinine Ratio: 19 (ref 12–28)
BUN: 18 mg/dL (ref 8–27)
Bilirubin Total: 0.2 mg/dL (ref 0.0–1.2)
CO2: 26 mmol/L (ref 20–29)
Calcium: 9.5 mg/dL (ref 8.7–10.3)
Chloride: 104 mmol/L (ref 96–106)
Creatinine, Ser: 0.94 mg/dL (ref 0.57–1.00)
GFR calc Af Amer: 74 mL/min/{1.73_m2} (ref >59)
GFR calc non Af Amer: 64 mL/min/{1.73_m2} (ref >59)
Globulin, Total: 2.4 g/dL (ref 1.5–4.5)
Glucose: 67 mg/dL (ref 65–99)
Potassium: 4.5 mmol/L (ref 3.5–5.2)
Sodium: 142 mmol/L (ref 134–144)
Total Protein: 6.8 g/dL (ref 6.0–8.5)

## 2020-03-01 LAB — HEPATITIS B CORE ANTIBODY, TOTAL: Hep B Core Total Ab: NEGATIVE

## 2020-03-01 LAB — QUANTIFERON-TB GOLD PLUS
QuantiFERON Mitogen Value: >10 IU/mL
QuantiFERON Nil Value: 0.02 IU/mL
QuantiFERON TB1 Ag Value: 0.02 IU/mL
QuantiFERON TB2 Ag Value: 0.03 IU/mL
QuantiFERON-TB Gold Plus: NEGATIVE

## 2020-03-01 LAB — CBC WITH DIFFERENTIAL/PLATELET
Basophils Absolute: 0 10*3/uL (ref 0.0–0.2)
Basos: 1 %
EOS (ABSOLUTE): 0.1 10*3/uL (ref 0.0–0.4)
Eos: 3 %
Hematocrit: 42.1 % (ref 34.0–46.6)
Hemoglobin: 13.7 g/dL (ref 11.1–15.9)
Immature Grans (Abs): 0 10*3/uL (ref 0.0–0.1)
Immature Granulocytes: 0 %
Lymphocytes Absolute: 1.7 10*3/uL (ref 0.7–3.1)
Lymphs: 40 %
MCH: 30.7 pg (ref 26.6–33.0)
MCHC: 32.5 g/dL (ref 31.5–35.7)
MCV: 94 fL (ref 79–97)
Monocytes Absolute: 0.5 10*3/uL (ref 0.1–0.9)
Monocytes: 12 %
Neutrophils Absolute: 1.9 10*3/uL (ref 1.4–7.0)
Neutrophils: 44 %
Platelets: 172 10*3/uL (ref 150–450)
RBC: 4.46 x10E6/uL (ref 3.77–5.28)
RDW: 12.4 % (ref 11.7–15.4)
WBC: 4.3 10*3/uL (ref 3.4–10.8)

## 2020-03-01 LAB — HEPATITIS PANEL, ACUTE
Hep A IgM: NEGATIVE
Hep B C IgM: NEGATIVE
Hep C Virus Ab: <0.1 s/co ratio (ref 0.0–0.9)
Hepatitis B Surface Ag: NEGATIVE

## 2020-03-01 LAB — HGB A1C W/O EAG: Hgb A1c MFr Bld: 6.1 % — ABNORMAL HIGH (ref 4.8–5.6)

## 2020-03-01 LAB — VITAMIN D 25 HYDROXY (VIT D DEFICIENCY, FRACTURES): Vit D, 25-Hydroxy: 48.1 ng/mL (ref 30.0–100.0)

## 2020-03-01 LAB — VARICELLA ZOSTER ANTIBODY, IGG: Varicella zoster IgG: 2728 index (ref >165)

## 2020-03-01 LAB — TSH: TSH: 4.31 u[IU]/mL (ref 0.450–4.500)

## 2020-03-01 LAB — VITAMIN B12: Vitamin B-12: 345 pg/mL (ref 232–1245)

## 2020-03-03 NOTE — Progress Notes (Signed)
Jamyah:  Laboratory evaluation showed mild elevated A1c, indicating mild elevated glucose level over the past few months.  This will make a diagnosis of prediabetes, you should start by diet, exercise.  Rest of the laboratory evaluation showed no significant abnormalities.  JC virus antibodies and titer is still pending.  Marcial Pacas, M.D. Ph.D.  [facility] [place], [place] [address] Phone: [phone] Fax:      [phone]

## 2020-03-04 ENCOUNTER — Telehealth: Payer: Self-pay

## 2020-03-04 NOTE — Telephone Encounter (Signed)
Aubagio start form with labs faxed to [facility]. Received a receipt of confirmation.  I attempted PA for aubagio on covermymeds. Key: [id number] - PA Case ID: [id number]  Received this notice: "This medication or product is on your plan's list of covered drugs. Prior authorization is not required at this time. If your pharmacy has questions regarding the processing of your prescription, please have them call the [facility] pharmacy help desk at [phone]. **Please note: Formulary lowering, tiering exception, cost reduction and/or pre-benefit determination review (including prospective Medicare hospice reviews) requests cannot be requested using this method of submission. Please contact us at [phone] instead."

## 2020-03-05 ENCOUNTER — Encounter: Payer: Self-pay | Admitting: Neurology

## 2020-03-06 ENCOUNTER — Other Ambulatory Visit: Payer: Self-pay | Admitting: *Deleted

## 2020-03-06 ENCOUNTER — Telehealth: Payer: Self-pay | Admitting: *Deleted

## 2020-03-06 DIAGNOSIS — G35 Multiple sclerosis: Secondary | ICD-10-CM

## 2020-03-06 MED ORDER — REBIF 44 MCG/0.5ML ~~LOC~~ SOSY: PREFILLED_SYRINGE | SUBCUTANEOUS | 0 refills | Status: DC

## 2020-03-06 NOTE — Progress Notes (Signed)
Faxed printed/signed rx reb[facility] at [phone]. Received fax confirmation.

## 2020-03-06 NOTE — Telephone Encounter (Signed)
Labs collected on 02/27/20:  JCV ab 0.51 (H) positive.  Dr. Krista Blue has reviewed this information. Full printed report sent for scanning.

## 2020-03-11 NOTE — Telephone Encounter (Signed)
I called [facility]. Ana Russell is ready for shipment. They will call the pt today to set up delivery.

## 2020-03-12 ENCOUNTER — Other Ambulatory Visit: Payer: Self-pay | Admitting: *Deleted

## 2020-03-12 MED ORDER — AUBAGIO 7 MG PO TABS: 1.0000 | ORAL_TABLET | Freq: Every day | ORAL | 12 refills | Status: DC

## 2020-03-18 ENCOUNTER — Other Ambulatory Visit: Payer: Self-pay

## 2020-03-18 DIAGNOSIS — G35 Multiple sclerosis: Secondary | ICD-10-CM

## 2020-03-24 NOTE — Telephone Encounter (Signed)
Received VO from Dr. Krista Blue for LFTs once a month for 5 months after starting aubagio.

## 2020-03-24 NOTE — Telephone Encounter (Signed)
Noted I have called and talked to Patient about referral .

## 2020-03-25 ENCOUNTER — Other Ambulatory Visit: Payer: Self-pay

## 2020-03-25 DIAGNOSIS — Z79899 Other long term (current) drug therapy: Secondary | ICD-10-CM

## 2020-03-25 DIAGNOSIS — G35 Multiple sclerosis: Secondary | ICD-10-CM

## 2020-03-31 NOTE — Telephone Encounter (Signed)
Received this mychart message from pt: "Talked with [facility] and they said I need a referral sent to one or the other 2 Dr.s Hinton Dyer recommended. First available is September with Noemi Chapel so I need a referral sent for her before they can set up an appointment. Thank you so much."  Hinton Dyer- Can you help the pt with this?

## 2020-04-09 NOTE — Telephone Encounter (Signed)
Noted this was sent

## 2020-04-18 ENCOUNTER — Encounter: Payer: Self-pay | Admitting: Neurology

## 2020-04-24 ENCOUNTER — Other Ambulatory Visit (INDEPENDENT_AMBULATORY_CARE_PROVIDER_SITE_OTHER): Payer: Self-pay

## 2020-04-24 ENCOUNTER — Telehealth: Payer: Self-pay | Admitting: Neurology

## 2020-04-24 ENCOUNTER — Telehealth: Payer: Self-pay | Admitting: *Deleted

## 2020-04-24 DIAGNOSIS — G35 Multiple sclerosis: Secondary | ICD-10-CM

## 2020-04-24 DIAGNOSIS — Z79899 Other long term (current) drug therapy: Secondary | ICD-10-CM

## 2020-04-24 DIAGNOSIS — Z0289 Encounter for other administrative examinations: Secondary | ICD-10-CM

## 2020-04-24 NOTE — Telephone Encounter (Signed)
Return mail, I mailed pt Cd out on 04/24/20 Ana Russell

## 2020-04-24 NOTE — Telephone Encounter (Signed)
Mailed patients MRI disc

## 2020-04-25 LAB — HEPATIC FUNCTION PANEL
ALT: 23 IU/L (ref 0–32)
AST: 28 IU/L (ref 0–40)
Albumin: 4.6 g/dL (ref 3.8–4.8)
Alkaline Phosphatase: 51 IU/L (ref 48–121)
Bilirubin Total: 0.3 mg/dL (ref 0.0–1.2)
Bilirubin, Direct: 0.11 mg/dL (ref 0.00–0.40)
Total Protein: 6.8 g/dL (ref 6.0–8.5)

## 2020-04-28 ENCOUNTER — Other Ambulatory Visit: Payer: Self-pay | Admitting: Obstetrics and Gynecology

## 2020-04-29 MED ORDER — ESTRADIOL 0.1 MG/GM VA CREA: TOPICAL_CREAM | VAGINAL | 0 refills | Status: DC

## 2020-04-29 NOTE — Telephone Encounter (Signed)
Has CE scheduled 05/14/20.

## 2020-05-08 ENCOUNTER — Ambulatory Visit
Admission: RE | Admit: 2020-05-08 | Discharge: 2020-05-08 | Disposition: A | Discharge status: Home / Self Care | Payer: Medicare Other | Source: Ambulatory Visit | Attending: Internal Medicine | Admitting: Internal Medicine

## 2020-05-08 ENCOUNTER — Other Ambulatory Visit: Payer: Self-pay

## 2020-05-08 DIAGNOSIS — Z1231 Encounter for screening mammogram for malignant neoplasm of breast: Secondary | ICD-10-CM

## 2020-05-14 ENCOUNTER — Other Ambulatory Visit: Payer: Self-pay | Admitting: Obstetrics and Gynecology

## 2020-05-14 ENCOUNTER — Other Ambulatory Visit: Payer: Self-pay

## 2020-05-14 ENCOUNTER — Ambulatory Visit (INDEPENDENT_AMBULATORY_CARE_PROVIDER_SITE_OTHER): Payer: Medicare Other | Admitting: Obstetrics and Gynecology

## 2020-05-14 ENCOUNTER — Encounter: Payer: Self-pay | Admitting: Obstetrics and Gynecology

## 2020-05-14 VITALS — BP 110/78 | Ht 64.0 in | Wt 118.0 lb

## 2020-05-14 DIAGNOSIS — N952 Postmenopausal atrophic vaginitis: Secondary | ICD-10-CM

## 2020-05-14 DIAGNOSIS — Z01419 Encounter for gynecological examination (general) (routine) without abnormal findings: Secondary | ICD-10-CM | POA: Diagnosis not present

## 2020-05-14 DIAGNOSIS — Z9189 Other specified personal risk factors, not elsewhere classified: Secondary | ICD-10-CM

## 2020-05-14 DIAGNOSIS — M810 Age-related osteoporosis without current pathological fracture: Secondary | ICD-10-CM | POA: Diagnosis not present

## 2020-05-14 MED ORDER — ESTRADIOL 10 MCG VA TABS
1.0000 | ORAL_TABLET | Freq: Every day | VAGINAL | 4 refills | Status: DC
Start: 2020-05-14 — End: 2020-06-02

## 2020-05-14 NOTE — Progress Notes (Signed)
Ana Russell [birth year] [id number]  SUBJECTIVE:  64 y.o. G2P0020 female here for a breast and pelvic exam.  Having concerns since insurance mandated her switch from the Yuvafem to the vaginal estradiol cream.  She finds it messy and also feels that her vaginal atrophy symptoms and dryness and narrowing of the cavity are not as well controlled using the cream versus when she was using the Yuvafem. She has no other gynecologic concerns.  Current Outpatient Medications  Medication Sig Dispense Refill  . Ascorbic Acid (VITAMIN C PO) Take by mouth.    . AUBAGIO 7 MG TABS Take 1 tablet by mouth daily. 30 tablet 12  . Cholecalciferol (VITAMIN D PO) Take by mouth.      . estradiol (ESTRACE) 0.1 MG/GM vaginal cream INSERT 1/4 APPLICATORFUL  (1 GRAM) VAGINALLY TWICE  WEEKLY 42.5 g 0  . metroNIDAZOLE (METROGEL) 1 % gel   2  . SYNTHROID 100 MCG tablet 100 mg daily.  0   No current facility-administered medications for this visit.   Allergies: Patient has no known allergies.  No LMP recorded. Patient is postmenopausal.  Past medical history,surgical history, problem list, medications, allergies, family history and social history were all reviewed and documented as reviewed in the EPIC chart.  GYN ROS: no abnormal bleeding, pelvic pain or discharge, no breast pain or new or enlarging lumps on self exam.  No dysuria, urinary frequency, pain with urination, cloudy/malodorous urine.   OBJECTIVE:  BP 110/78 (BP Location: Right Arm, Patient Position: Sitting, Cuff Size: Normal)   Ht 5\' 4"  (1.626 m)   Wt 118 lb (53.5 kg)   BMI 20.25 kg/m  The patient appears well, alert, oriented, in no distress.  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 G2P0020 here for a breast and pelvic exam  PLAN:   1. Postmenopausal/vulvovaginal atrophy.  Concerns about use of Estrace cream as noted above, and therefore she would like to try to go back on the [place], so I will write her a new prescription for the [facility].  Risk of systemic absorption of vaginal estrogens include thrombotic diseases like heart attack, stroke, DVT, PE, breast cancer issue, endometrial stimulation. 2. Pap smear/HPV 04/2019.  No significant history of abnormal Pap smears.  Next Pap smear due 2025 following the current guidelines recommending the 5 year interval. 3. Mammogram 05/2020.  Normal breast exam today.  She will continue with annual mammograms. 4. Colonoscopy 2018.  She will follow up at the interval recommended by her GI specialist.   5. DEXA 2019 with T score -2.7.  Alendronate was prescribed last year but she ultimately decided not to take it.  She indicates she did increase her weightbearing exercise and is taking vitamin D supplement.  She has hypothyroidism.  We will repeat the DEXA this year and she will plan to schedule this. 6. Health maintenance.  No labs today as she already has these completed with her primary care doctor.  Return annually or sooner, prn.  Joseph Pierini MD 05/14/20

## 2020-05-19 ENCOUNTER — Telehealth: Payer: Self-pay | Admitting: *Deleted

## 2020-05-19 NOTE — Telephone Encounter (Signed)
PA done via cover my meds for estradiol vaginal tablets 10 mcg. Medication denied is not a covered drug on patient formulary list. Preferred drug is premarin, but declined and did not to try this medication. Patient will follow up if needed.

## 2020-05-26 ENCOUNTER — Other Ambulatory Visit (INDEPENDENT_AMBULATORY_CARE_PROVIDER_SITE_OTHER): Payer: Self-pay

## 2020-05-26 DIAGNOSIS — G35 Multiple sclerosis: Secondary | ICD-10-CM

## 2020-05-26 DIAGNOSIS — Z79899 Other long term (current) drug therapy: Secondary | ICD-10-CM

## 2020-05-26 DIAGNOSIS — Z0289 Encounter for other administrative examinations: Secondary | ICD-10-CM

## 2020-05-27 LAB — HEPATIC FUNCTION PANEL
ALT: 19 IU/L (ref 0–32)
AST: 28 IU/L (ref 0–40)
Albumin: 4.5 g/dL (ref 3.8–4.8)
Alkaline Phosphatase: 53 IU/L (ref 44–121)
Bilirubin Total: 0.4 mg/dL (ref 0.0–1.2)
Bilirubin, Direct: 0.1 mg/dL (ref 0.00–0.40)
Total Protein: 7 g/dL (ref 6.0–8.5)

## 2020-06-02 ENCOUNTER — Telehealth: Payer: Self-pay | Admitting: *Deleted

## 2020-06-02 MED ORDER — PREMARIN 0.625 MG/GM VA CREA: TOPICAL_CREAM | VAGINAL | 11 refills | Status: DC

## 2020-06-02 NOTE — Telephone Encounter (Signed)
Left detailed message left on home # Rx has been sent and to D/C the estradiol vaginal cream.

## 2020-06-02 NOTE — Telephone Encounter (Signed)
Premarin: 0.625 mg/g cream 0.5 - 1 g nightly for vaginal application for 2 weeks, then 2 times per week after that

## 2020-06-02 NOTE — Telephone Encounter (Signed)
Patient estradiol vaginal tablet 10 mcg is not covered a medication patient will need to try/fail a preferred drug such as premarin before insurance will possibility pay for vagifem tablets. Patient said she would like to try premarin vaginal cream. Please advise

## 2020-06-05 ENCOUNTER — Ambulatory Visit (INDEPENDENT_AMBULATORY_CARE_PROVIDER_SITE_OTHER): Payer: Medicare Other | Admitting: Neurology

## 2020-06-05 ENCOUNTER — Encounter: Payer: Self-pay | Admitting: Neurology

## 2020-06-05 VITALS — BP 124/72 | Ht 64.0 in | Wt 119.0 lb

## 2020-06-05 DIAGNOSIS — G35 Multiple sclerosis: Secondary | ICD-10-CM | POA: Diagnosis not present

## 2020-06-05 NOTE — Progress Notes (Signed)
PATIENT: Ana Russell DOB: 17-Oct-1955  REASON FOR VISIT: follow up HISTORY FROM: patient  HISTORY OF PRESENT ILLNESS: Today 06/05/20  HISTORY HISTORY OF PRESENT ILLNESS:Ana Russell is a 64 years old right-handed Caucasian female, came in to followup for relapsing remitting multiple sclerosis. Initial symptoms in 2000 she presented with diplopia, but has never received any treatment, she was hospitalized In July 200 for acute onset of slurred speech, double vision, difficulty walking, MRI at that time has demonstrated right midbrain lesions, positive DWI,  She has been treated with Rebif since 2009, over the past few years, there was no relapsing episode, she is a Curator, exercise regularly, there was no significant change clinically. She only has mild gait difficulty, taking her Rebif on Monday, Wednesday, Friday, premedicated with Advil, overall tolerating the medication very well  Most recent MRI of the brain was 2012, had a repeat scan in January 20/15 his, she came in to review her MRI findings, we have seen the films together, with continued evidence of multiple supratentorium periventricular oval-shaped lesion, there was T1 black holes, no contrast enhancement,   After discussed with patient, she is overall doing well clinically fairly stable imaging-wise, she wants to stay on current treatment, She reported a history of mildly decreased white count, has been under close supervision of her primary care physician Dr. Noah Delaine at [facility], I do not have recent laboratory evaluations  UPDATE Jan 14th 2016: She is doing very well, enjoys her painting, last repeat MRI January 2015, MRI scans of brain showing extensive periventricular and subcortical white matter hyperintensities consistent with multiple sclerosis. No enhancing lesions are noted. Presence of multiple T1 black holes indicates chronic disease. 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 I have reviewed MRI of the brain with patient, there was no significant changes compared to previous scans.  She is continuing rebif Monday Wednesday Friday, premedicated with Advil, She has mild unsteady gait, using walking sticks, occasionally  Most recent laboratory showed mild elevated glucose 127, normal liver functional test, mild elevated LDL 116  UPDATE Sep 25 2015: She walks everyday 2 miles, weight training, She is doing pull up, She walks with a stick, she has urinary urgency, mild constipation.  She noticed mild left ear tinnitus with mild left hearing loss. Then she got right hearing loss. She still taking Rebif, tolerating well,  I reviewed laboratory in December 2016, elevated LDL 125, HDL was 73, mild elevated TSH 5.76, vitamin D was normal 50 1.7, A1c was mild elevated 6.0  UPDATE Jan 18 2020: Ana Russell has been doing very well, but complains of needle fatigue, subcutaneous nodule with her repeat Rebif injection, continue has mild reaction following each injection, also consider other MS medication choices  She enjoys gardening, painting, mild unsteady gait, carry a cane, but denies significant limitations  We personally reviewed most recent MRI of the brain with without contrast in March 2017, moderate plaque load, no contrast-enhancement, no change compared to previous scan in 2000 09  Update 02/27/2020 She is accompanied by her husband at today's clinical visit, we personally reviewed MRI of the brain with without contrast from [facility] on 02/01/2020, continued evidence of extensive chronic demyelinating plaque, and atrophy along corpus callosum, there was no significant change compared to previous MRI scan in 2017, there was no contrast-enhancement  She is tired of frequent repeat Rebif injection, run out of injection site,  We had extensive discussion, with her significant lesion  load, mild unsteady gait, husband also reported  mental cloudiness occasionally, she should stay on long-term immunomodulation therapy  We  visited [place] [facility] website, offered few choices, decided to start it Aubagio  Update June 05, 2020 SS: Extensive laboratory evaluation to switch MS medications revealed, elevated A1c 6.1, JCV was positive 0.51.  Has been on Aubagio for about 3 months, doing well, other than hair loss, feels her MS is at more consistent level, no ups and downs.  Is exercising, walking 2 miles a day, practicing standing on one leg, planning to start jumping rope.  No falls, keeps a cane just in case.  Some cloudiness, confused about month. Pleased with Aubagio. Recent hepatic function panel was normal.  Had blood work done with Dr. Shelia Media recently, everything looked good, she will drop copy off. Feels feels her MS course has been stable, only 3 exacerbations.  REVIEW OF SYSTEMS: Out of a complete 14 system review of symptoms, the patient complains only of the following symptoms, and all other reviewed systems are negative.  N/A  ALLERGIES: No Known Allergies  HOME MEDICATIONS: Outpatient Medications Prior to Visit  Medication Sig Dispense Refill  . Ascorbic Acid (VITAMIN C PO) Take by mouth.    . Cholecalciferol (VITAMIN D PO) Take by mouth.      . conjugated estrogens (PREMARIN) vaginal cream Insert 1 gram vaginally nightly for 2 weeks then use 1 gram vaginally twice weekly. 42.5 g 11  . metroNIDAZOLE (METROGEL) 1 % gel   2  . SYNTHROID 100 MCG tablet 100 mg daily.  0  . AUBAGIO 7 MG TABS Take 1 tablet by mouth daily. 30 tablet 12   No facility-administered medications prior to visit.    PAST MEDICAL HISTORY: Past Medical History:  Diagnosis Date  . C. difficile colitis 2013  . Endometrial hyperplasia without atypia, simple 09/2003   NORMAL ENDOMETRIAL BIOPSY 03/2005  . Fluid retention    in ears  . Hypothyroidism   . Meniere disease   . MS (multiple sclerosis) ([place]) 02/25/2011  . Osteoporosis  01/2018   T score -2.7    PAST SURGICAL HISTORY: Past Surgical History:  Procedure Laterality Date  . DILATION AND CURETTAGE OF UTERUS     TAB  . HYSTEROSCOPY  09/2003   POLYPS  . KNEE SURGERY  2011   bilateral knee scopes  . PELVIC LAPAROSCOPY    . THYROIDECTOMY, PARTIAL      FAMILY HISTORY: Family History  Problem Relation Age of Onset  . Diabetes Mother   . Diabetes Paternal Grandfather   . Cancer Sister        Thyroid  . Heart attack Sister   . Breast cancer Maternal Aunt     SOCIAL HISTORY: Social History   Socioeconomic History  . Marital status: Married    Spouse name: Ed  . Number of children: 0  . Years of education: Bachelors  . Highest education level: Not on file  Occupational History  . Not on file  Tobacco Use  . Smoking status: Former Research scientist (life sciences)  . Smokeless tobacco: Never Used  Vaping Use  . Vaping Use: Never used  Substance and Sexual Activity  . Alcohol use: Yes    Alcohol/week: 0.0 standard drinks    Comment: TUES THURS SAT SUN WINE  . Drug use: No  . Sexual activity: Yes    Birth control/protection: Post-menopausal    Comment: 1st intercourse 82 yo-5 partners  Other Topics Concern  . Not on file  Social History Narrative   Patient lives at home with husband (Ed)   Patient is right handed   Education level Bachelors degree   Caffeine consumption is 3 cups daily   Social Determinants of Health   Financial Resource Strain:   . Difficulty of Paying Living Expenses: Not on file  Food Insecurity:   . Worried About Charity fundraiser in the Last Year: Not on file  . Ran Out of Food in the Last Year: Not on file  Transportation Needs:   . Lack of Transportation (Medical): Not on file  . Lack of Transportation (Non-Medical): Not on file  Physical Activity:   . Days of Exercise per Week: Not on file  . Minutes of Exercise per Session: Not on file  Stress:   . Feeling of Stress : Not on file  Social Connections:   . Frequency of  Communication with Friends and Family: Not on file  . Frequency of Social Gatherings with Friends and Family: Not on file  . Attends Religious Services: Not on file  . Active Member of Clubs or Organizations: Not on file  . Attends Archivist Meetings: Not on file  . Marital Status: Not on file  Intimate Partner Violence:   . Fear of Current or Ex-Partner: Not on file  . Emotionally Abused: Not on file  . Physically Abused: Not on file  . Sexually Abused: Not on file   PHYSICAL EXAM  Vitals:   06/05/20 1014  BP: 124/72  Weight: 119 lb (54 kg)  Height: 5\' 4"  (1.626 m)   Body mass index is 20.43 kg/m.  Generalized: Well developed, in no acute distress   Neurological examination  Mentation: Alert oriented to time, place, history taking. Follows all commands speech and language fluent Cranial nerve II-XII: Pupils were equal round reactive to light. Extraocular movements were full, visual field were full on confrontational test. Facial sensation and strength were normal.  Head turning and shoulder shrug  were normal and symmetric. Motor: The motor testing reveals 5 over 5 strength of all 4 extremities. Good symmetric motor tone is noted throughout.  Sensory: Sensory testing is intact to soft touch on all 4 extremities. No evidence of extinction is noted.  Coordination: Cerebellar testing reveals good finger-nose-finger and heel-to-shin bilaterally.  Gait and station: Gait is slightly cautious, but overall steady, can walk independently. Reflexes: Deep tendon reflexes are symmetric but increased throughout.  DIAGNOSTIC DATA (LABS, IMAGING, TESTING) - I reviewed patient records, labs, notes, testing and imaging myself where available.  Lab Results  Component Value Date   WBC 4.3 02/27/2020   HGB 13.7 02/27/2020   HCT 42.1 02/27/2020   MCV 94 02/27/2020   PLT 172 02/27/2020      Component Value Date/Time   NA 142 02/27/2020 0834   K 4.5 02/27/2020 0834   CL 104  02/27/2020 0834   CO2 26 02/27/2020 0834   GLUCOSE 67 02/27/2020 0834   GLUCOSE 95 04/12/2019 0857   BUN 18 02/27/2020 0834   CREATININE 0.94 02/27/2020 0834   CREATININE 0.80 04/12/2019 0857   CALCIUM 9.5 02/27/2020 0834   PROT 7.0 05/26/2020 0936   ALBUMIN 4.5 05/26/2020 0936   AST 28 05/26/2020 0936   ALT 19 05/26/2020 0936   ALKPHOS 53 05/26/2020 0936   BILITOT 0.4 05/26/2020 0936   GFRNONAA 64 02/27/2020 0834   GFRAA 74 02/27/2020 0834   Lab Results  Component Value Date   CHOL 207 (H) 04/12/2019  HDL 75 04/12/2019   LDLCALC 115 (H) 04/12/2019   LDLDIRECT 121.7 02/25/2011   TRIG 80 04/12/2019   CHOLHDL 2.8 04/12/2019   Lab Results  Component Value Date   HGBA1C 6.1 (H) 02/27/2020   Lab Results  Component Value Date   VITAMINB12 345 02/27/2020   Lab Results  Component Value Date   TSH 4.310 02/27/2020   ASSESSMENT AND PLAN 64 y.o. year old female  has a past medical history of C. difficile colitis (2013), Endometrial hyperplasia without atypia, simple (09/2003), Fluid retention, Hypothyroidism, Meniere disease, MS (multiple sclerosis) ([place]) (02/25/2011), and Osteoporosis (01/2018). here with:  1.  Relapsing remitting multiple sclerosis -Overall clinically and imaging stable -On Rebif since 2009, had side effect with needle injection -Given her significant lesion load, mild gait abnormality, occasionally mental cloudiness, still benefit from long-term immunomodulating therapy -Has been on Aubagio since June 2021, doing well -Continue Aubagio lower dose 7 mg daily -Encouraged to continue daily exercise -Recent blood work with PCP was done, will send copies, of interest, TSH, CBC with differential, CMP-recent hepatic function panel was normal in EPIC -Follow-up in 6 months or sooner if needed  I spent 30 minutes of face-to-face and non-face-to-face time with patient.  This included previsit chart review, lab review, study review, order entry, electronic health  record documentation, patient education.  Butler Denmark, AGNP-C, DNP 06/05/2020, 12:17 PM [facility] [address] [place],  [address] [phone]

## 2020-06-05 NOTE — Patient Instructions (Signed)
Continue current medications Please send your recent blood work to [place] See you back in 6 months Continue with your exercise

## 2020-06-25 ENCOUNTER — Other Ambulatory Visit (INDEPENDENT_AMBULATORY_CARE_PROVIDER_SITE_OTHER): Payer: Self-pay

## 2020-06-25 DIAGNOSIS — Z79899 Other long term (current) drug therapy: Secondary | ICD-10-CM

## 2020-06-25 DIAGNOSIS — G35 Multiple sclerosis: Secondary | ICD-10-CM

## 2020-06-25 DIAGNOSIS — Z0289 Encounter for other administrative examinations: Secondary | ICD-10-CM

## 2020-06-26 LAB — HEPATIC FUNCTION PANEL
ALT: 21 IU/L (ref 0–32)
AST: 28 IU/L (ref 0–40)
Albumin: 4.7 g/dL (ref 3.8–4.8)
Alkaline Phosphatase: 52 IU/L (ref 44–121)
Bilirubin Total: 0.3 mg/dL (ref 0.0–1.2)
Bilirubin, Direct: 0.11 mg/dL (ref 0.00–0.40)
Total Protein: 7.4 g/dL (ref 6.0–8.5)

## 2020-06-26 LAB — HEPATITIS B SURFACE ANTIBODY,QUALITATIVE: Hep B Surface Ab, Qual: NONREACTIVE

## 2020-07-15 ENCOUNTER — Other Ambulatory Visit: Payer: Self-pay

## 2020-07-15 ENCOUNTER — Other Ambulatory Visit: Payer: Self-pay | Admitting: Obstetrics and Gynecology

## 2020-07-15 ENCOUNTER — Ambulatory Visit (INDEPENDENT_AMBULATORY_CARE_PROVIDER_SITE_OTHER): Payer: Medicare Other

## 2020-07-15 DIAGNOSIS — Z780 Asymptomatic menopausal state: Secondary | ICD-10-CM

## 2020-07-15 DIAGNOSIS — M810 Age-related osteoporosis without current pathological fracture: Secondary | ICD-10-CM

## 2020-07-15 DIAGNOSIS — Z01419 Encounter for gynecological examination (general) (routine) without abnormal findings: Secondary | ICD-10-CM

## 2020-07-28 ENCOUNTER — Other Ambulatory Visit (INDEPENDENT_AMBULATORY_CARE_PROVIDER_SITE_OTHER): Payer: Self-pay

## 2020-07-28 DIAGNOSIS — Z79899 Other long term (current) drug therapy: Secondary | ICD-10-CM

## 2020-07-28 DIAGNOSIS — Z0289 Encounter for other administrative examinations: Secondary | ICD-10-CM

## 2020-07-28 DIAGNOSIS — G35 Multiple sclerosis: Secondary | ICD-10-CM

## 2020-07-29 LAB — HEPATIC FUNCTION PANEL
ALT: 22 IU/L (ref 0–32)
AST: 29 IU/L (ref 0–40)
Albumin: 4.5 g/dL (ref 3.8–4.8)
Alkaline Phosphatase: 49 IU/L (ref 44–121)
Bilirubin Total: 0.2 mg/dL (ref 0.0–1.2)
Bilirubin, Direct: <0.1 mg/dL (ref 0.00–0.40)
Total Protein: 6.8 g/dL (ref 6.0–8.5)

## 2020-08-04 ENCOUNTER — Telehealth: Payer: Self-pay | Admitting: *Deleted

## 2020-08-04 NOTE — Telephone Encounter (Addendum)
We received a PA request from [facility] for Rebif. This patient has been on Aubagio since June 2021. I called [facility] pharmacy at [phone] to make sure her medication profile reflects her most recent therapy. I spoke to patient care coordinator, Opal Sidles, who confirmed there is no active prescription on file for Rebif. The PA request for Rebif has been voided.

## 2020-08-12 ENCOUNTER — Other Ambulatory Visit: Payer: Self-pay | Admitting: *Deleted

## 2020-08-12 MED ORDER — AUBAGIO 7 MG PO TABS: 1.0000 | ORAL_TABLET | Freq: Every day | ORAL | 3 refills | Status: DC

## 2020-08-12 NOTE — Telephone Encounter (Signed)
Aubagio 7mg  refills sent to [facility]. Her patient assistance information was included on the prescription. I called the patient and left her a message letting her know this has been done. Provided our number to call back with any other needs.

## 2020-08-12 NOTE — Telephone Encounter (Signed)
I spoke to the patient. She wanted [place] know she has been approved for patient assistance for Aubagio. This will be through the [facility].   Information off her patient assistance card:  RE#[id number]#[id number]  She uses [facility] for her Aubagio. She is going to call them and provide this information so they can bill her medication through the patient assistance program.

## 2020-08-12 NOTE — Telephone Encounter (Signed)
Pt called, have copay assistant information for the nurse for Aubagio. Would like a call from the nurse.

## 2020-08-12 NOTE — Telephone Encounter (Signed)
Pt called, received a preapproval letter for Rebif injection and I m not on that medication. Informed the patient nurse has already reached out to [facility]. Would like a call from the nurse.

## 2020-08-12 NOTE — Telephone Encounter (Signed)
FYI: Pt called, have spoken with [facility]. [facility] suggested I call you to let you know they will be requesting a prescription for Aubagio.

## 2020-08-27 ENCOUNTER — Other Ambulatory Visit (INDEPENDENT_AMBULATORY_CARE_PROVIDER_SITE_OTHER): Payer: Self-pay

## 2020-08-27 ENCOUNTER — Other Ambulatory Visit: Payer: Self-pay

## 2020-08-27 ENCOUNTER — Other Ambulatory Visit: Payer: Self-pay | Admitting: *Deleted

## 2020-08-27 DIAGNOSIS — G35 Multiple sclerosis: Secondary | ICD-10-CM

## 2020-08-27 DIAGNOSIS — Z0289 Encounter for other administrative examinations: Secondary | ICD-10-CM

## 2020-08-27 DIAGNOSIS — Z79899 Other long term (current) drug therapy: Secondary | ICD-10-CM

## 2020-08-27 NOTE — Progress Notes (Signed)
Pt here for last 5 month recheck on hepatic panel after starting aubagio.  Order placed.

## 2020-08-28 LAB — HEPATIC FUNCTION PANEL
ALT: 18 IU/L (ref 0–32)
AST: 19 IU/L (ref 0–40)
Albumin: 4.3 g/dL (ref 3.8–4.8)
Alkaline Phosphatase: 47 IU/L (ref 44–121)
Bilirubin Total: 0.4 mg/dL (ref 0.0–1.2)
Bilirubin, Direct: 0.14 mg/dL (ref 0.00–0.40)
Total Protein: 6.4 g/dL (ref 6.0–8.5)

## 2020-12-04 ENCOUNTER — Encounter: Payer: Self-pay | Admitting: Neurology

## 2020-12-04 ENCOUNTER — Ambulatory Visit (INDEPENDENT_AMBULATORY_CARE_PROVIDER_SITE_OTHER): Payer: Medicare Other | Admitting: Neurology

## 2020-12-04 VITALS — BP 139/75 | HR 68 | Ht 64.0 in | Wt 120.0 lb

## 2020-12-04 DIAGNOSIS — G35 Multiple sclerosis: Secondary | ICD-10-CM | POA: Diagnosis not present

## 2020-12-04 NOTE — Patient Instructions (Signed)
Continue Aubagio at current dose Check labs today  See you back 6 months

## 2020-12-04 NOTE — Progress Notes (Signed)
PATIENT: Ana Russell DOB: 1955-10-29  REASON FOR VISIT: follow up HISTORY FROM: patient  HISTORY OF PRESENT ILLNESS: Today 12/04/20  HISTORY HISTORY OF PRESENT ILLNESS:Ana Russell is a 65 years old right-handed Caucasian female, came in to followup for relapsing remitting multiple sclerosis. Initial symptoms in 2000 she presented with diplopia, but has never received any treatment, she was hospitalized In July 200 for acute onset of slurred speech, double vision, difficulty walking, MRI at that time has demonstrated right midbrain lesions, positive DWI,  She has been treated with Rebif since 2009, over the past few years, there was no relapsing episode, she is a Curator, exercise regularly, there was no significant change clinically. She only has mild gait difficulty, taking her Rebif on Monday, Wednesday, Friday, premedicated with Advil, overall tolerating the medication very well  Most recent MRI of the brain was 2012, had a repeat scan in January 20/15 his, she came in to review her MRI findings, we have seen the films together, with continued evidence of multiple supratentorium periventricular oval-shaped lesion, there was T1 black holes, no contrast enhancement,   After discussed with patient, she is overall doing well clinically fairly stable imaging-wise, she wants to stay on current treatment, She reported a history of mildly decreased white count, has been under close supervision of her primary care physician Dr. Noah Delaine at [facility], I do not have recent laboratory evaluations  UPDATE Jan 14th 2016: She is doing very well, enjoys her painting, last repeat MRI January 2015, MRI scans of brain showing extensive periventricular and subcortical white matter hyperintensities consistent with multiple sclerosis. No enhancing lesions are noted. Presence of multiple T1 black holes indicates chronic disease. 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 I have reviewed MRI of the brain with patient, there was no significant changes compared to previous scans.  She is continuing rebif Monday Wednesday Friday, premedicated with Advil, She has mild unsteady gait, using walking sticks, occasionally  Most recent laboratory showed mild elevated glucose 127, normal liver functional test, mild elevated LDL 116  UPDATE Sep 25 2015: She walks everyday 2 miles, weight training, She is doing pull up, She walks with a stick, she has urinary urgency, mild constipation.  She noticed mild left ear tinnitus with mild left hearing loss. Then she got right hearing loss. She still taking Rebif, tolerating well,  I reviewed laboratory in December 2016, elevated LDL 125, HDL was 73, mild elevated TSH 5.76, vitamin D was normal 50 1.7, A1c was mild elevated 6.0  UPDATE Jan 18 2020: Ana Russell has been doing very well, but complains of needle fatigue, subcutaneous nodule with her repeat Rebif injection, continue has mild reaction following each injection, also consider other MS medication choices  She enjoys gardening, painting, mild unsteady gait, carry a cane, but denies significant limitations  We personally reviewed most recent MRI of the brain with without contrast in March 2017, moderate plaque load, no contrast-enhancement, no change compared to previous scan in 2000 09  Update 02/27/2020 She is accompanied by her husband at today's clinical visit, we personally reviewed MRI of the brain with without contrast from [facility] on 02/01/2020, continued evidence of extensive chronic demyelinating plaque, and atrophy along corpus callosum, there was no significant change compared to previous MRI scan in 2017, there was no contrast-enhancement  She is tired of frequent repeat Rebif injection, run out of injection site,  We had extensive discussion, with her significant lesion  load, mild unsteady gait, husband also reported  mental cloudiness occasionally, she should stay on long-term immunomodulation therapy  We  visited [facility] website, offered few choices, decided to start it Aubagio  Update June 05, 2020 SS: Extensive laboratory evaluation to switch MS medications revealed, elevated A1c 6.1, JCV was positive 0.51.  Has been on Aubagio for about 3 months, doing well, other than hair loss, feels her MS is at more consistent level, no ups and downs.  Is exercising, walking 2 miles a day, practicing standing on one leg, planning to start jumping rope.  No falls, keeps a cane just in case.  Some cloudiness, confused about month. Pleased with Aubagio. Recent hepatic function panel was normal.  Had blood work done with Dr. Shelia Media recently, everything looked good, she will drop copy off. Feels feels her MS course has been stable, only 3 exacerbations.  Update December 03, 2020 SS: Here today unaccompanied, has been on Aubagio only 7 mg daily since June 2021, tolerating well, MS stable, with the exception of hair loss.  Having left eye issue,sees throbbing in the left eye, seeing eye doctor in the next few days, had something similar with fluid to the eye.  Remains active, getting ready for her garden, painting, walks daily, yesterday walks 3 miles, only holds her cane, doesn't use it.  No falls. LFT in Dec 2021 was normal. Her paintings are going to be featured in the senior center next month.   REVIEW OF SYSTEMS: Out of a complete 14 system review of symptoms, the patient complains only of the following symptoms, and all other reviewed systems are negative.  N/A  ALLERGIES: No Known Allergies  HOME MEDICATIONS: Outpatient Medications Prior to Visit  Medication Sig Dispense Refill  . Ascorbic Acid (VITAMIN C PO) Take by mouth.    . AUBAGIO 7 MG TABS Take 1 tablet by mouth daily. 90 tablet 3  . Cholecalciferol (VITAMIN D PO) Take by mouth.    . conjugated estrogens (PREMARIN) vaginal cream Insert 1 gram  vaginally nightly for 2 weeks then use 1 gram vaginally twice weekly. 42.5 g 11  . metroNIDAZOLE (METROGEL) 1 % gel   2  . SYNTHROID 100 MCG tablet 100 mg daily.  0   No facility-administered medications prior to visit.    PAST MEDICAL HISTORY: Past Medical History:  Diagnosis Date  . C. difficile colitis 2013  . Endometrial hyperplasia without atypia, simple 09/2003   NORMAL ENDOMETRIAL BIOPSY 03/2005  . Fluid retention    in ears  . Hypothyroidism   . Meniere disease   . MS (multiple sclerosis) ([place]) 02/25/2011  . Osteoporosis 01/2018   T score -2.7    PAST SURGICAL HISTORY: Past Surgical History:  Procedure Laterality Date  . DILATION AND CURETTAGE OF UTERUS     TAB  . HYSTEROSCOPY  09/2003   POLYPS  . KNEE SURGERY  2011   bilateral knee scopes  . PELVIC LAPAROSCOPY    . THYROIDECTOMY, PARTIAL      FAMILY HISTORY: Family History  Problem Relation Age of Onset  . Diabetes Mother   . Diabetes Paternal Grandfather   . Cancer Sister        Thyroid  . Heart attack Sister   . Breast cancer Maternal Aunt     SOCIAL HISTORY: Social History   Socioeconomic History  . Marital status: Married    Spouse name: Ed  . Number of children: 0  . Years of education: Bachelors  .  Highest education level: Not on file  Occupational History  . Not on file  Tobacco Use  . Smoking status: Former Research scientist (life sciences)  . Smokeless tobacco: Never Used  Vaping Use  . Vaping Use: Never used  Substance and Sexual Activity  . Alcohol use: Yes    Alcohol/week: 0.0 standard drinks    Comment: TUES THURS SAT SUN WINE  . Drug use: No  . Sexual activity: Yes    Birth control/protection: Post-menopausal    Comment: 1st intercourse 21 yo-5 partners  Other Topics Concern  . Not on file  Social History Narrative   Patient lives at home with husband (Ed)   Patient is right handed   Education level Bachelors degree   Caffeine consumption is 3 cups daily   Social Determinants of Adult nurse Strain: Not on file  Food Insecurity: Not on file  Transportation Needs: Not on file  Physical Activity: Not on file  Stress: Not on file  Social Connections: Not on file  Intimate Partner Violence: Not on file   PHYSICAL EXAM  Vitals:   12/04/20 1028  BP: 139/75  Pulse: 68  Weight: 120 lb (54.4 kg)  Height: 5\' 4"  (1.626 m)   Body mass index is 20.6 kg/m.  Generalized: Well developed, in no acute distress   Neurological examination  Mentation: Alert oriented to time, place, history taking. Follows all commands speech and language fluent Cranial nerve II-XII: Pupils were equal round reactive to light. Extraocular movements were full, visual field were full on confrontational test. Facial sensation and strength were normal.  Head turning and shoulder shrug  were normal and symmetric. Motor: The motor testing reveals 5 over 5 strength of all 4 extremities. Good symmetric motor tone is noted throughout.  Sensory: Sensory testing is intact to soft touch on all 4 extremities. No evidence of extinction is noted.  Coordination: Cerebellar testing reveals good finger-nose-finger and heel-to-shin bilaterally.  Gait and station: Gait is slightly cautious, but overall steady, can walk independently. Reflexes: Deep tendon reflexes are symmetric but increased throughout.  DIAGNOSTIC DATA (LABS, IMAGING, TESTING) - I reviewed patient records, labs, notes, testing and imaging myself where available.  Lab Results  Component Value Date   WBC 4.3 02/27/2020   HGB 13.7 02/27/2020   HCT 42.1 02/27/2020   MCV 94 02/27/2020   PLT 172 02/27/2020      Component Value Date/Time   NA 142 02/27/2020 0834   K 4.5 02/27/2020 0834   CL 104 02/27/2020 0834   CO2 26 02/27/2020 0834   GLUCOSE 67 02/27/2020 0834   GLUCOSE 95 04/12/2019 0857   BUN 18 02/27/2020 0834   CREATININE 0.94 02/27/2020 0834   CREATININE 0.80 04/12/2019 0857   CALCIUM 9.5 02/27/2020 0834   PROT 6.4  08/27/2020 1052   ALBUMIN 4.3 08/27/2020 1052   AST 19 08/27/2020 1052   ALT 18 08/27/2020 1052   ALKPHOS 47 08/27/2020 1052   BILITOT 0.4 08/27/2020 1052   GFRNONAA 64 02/27/2020 0834   GFRAA 74 02/27/2020 0834   Lab Results  Component Value Date   CHOL 207 (H) 04/12/2019   HDL 75 04/12/2019   LDLCALC 115 (H) 04/12/2019   LDLDIRECT 121.7 02/25/2011   TRIG 80 04/12/2019   CHOLHDL 2.8 04/12/2019   Lab Results  Component Value Date   HGBA1C 6.1 (H) 02/27/2020   Lab Results  Component Value Date   VITAMINB12 345 02/27/2020   Lab Results  Component Value Date  TSH 4.310 02/27/2020   ASSESSMENT AND PLAN 65 y.o. year old female  has a past medical history of C. difficile colitis (2013), Endometrial hyperplasia without atypia, simple (09/2003), Fluid retention, Hypothyroidism, Meniere disease, MS (multiple sclerosis) ([place]) (02/25/2011), and Osteoporosis (01/2018). here with:  1.  Relapsing remitting multiple sclerosis -Overall clinically and imaging stable -On Rebif since 2009, had side effect with needle injection, injection fatigue -Given her significant lesion load, mild gait abnormality, occasionally mental cloudiness, still benefit from long-term immunomodulating therapy -Has been on Aubagio since June 2021, doing well, except hair loss (encouraged multivitamin, keratin shampoo, frequent trims to help) -Continue Aubagio lower dose 7 mg daily -Encouraged to continue daily exercise -Check routine labs today -Follow-up in 6 months or sooner if needed  I spent 30 minutes of face-to-face and non-face-to-face time with patient.  This included previsit chart review, lab review, study review, order entry, electronic health record documentation, patient education.  Butler Denmark, AGNP-C, DNP 12/04/2020, 11:12 AM [facility] [address] [place], [place] [address] [phone]

## 2020-12-05 LAB — CBC WITH DIFFERENTIAL/PLATELET
Basophils Absolute: 0 10*3/uL (ref 0.0–0.2)
Basos: 1 %
EOS (ABSOLUTE): 0.2 10*3/uL (ref 0.0–0.4)
Eos: 6 %
Hematocrit: 43.6 % (ref 34.0–46.6)
Hemoglobin: 13.9 g/dL (ref 11.1–15.9)
Immature Grans (Abs): 0 10*3/uL (ref 0.0–0.1)
Immature Granulocytes: 0 %
Lymphocytes Absolute: 1.4 10*3/uL (ref 0.7–3.1)
Lymphs: 38 %
MCH: 30 pg (ref 26.6–33.0)
MCHC: 31.9 g/dL (ref 31.5–35.7)
MCV: 94 fL (ref 79–97)
Monocytes Absolute: 0.6 10*3/uL (ref 0.1–0.9)
Monocytes: 16 %
Neutrophils Absolute: 1.4 10*3/uL (ref 1.4–7.0)
Neutrophils: 39 %
Platelets: 171 10*3/uL (ref 150–450)
RBC: 4.64 x10E6/uL (ref 3.77–5.28)
RDW: 12.8 % (ref 11.7–15.4)
WBC: 3.6 10*3/uL (ref 3.4–10.8)

## 2020-12-05 LAB — COMPREHENSIVE METABOLIC PANEL
ALT: 18 IU/L (ref 0–32)
AST: 28 IU/L (ref 0–40)
Albumin/Globulin Ratio: 2.2 (ref 1.2–2.2)
Albumin: 4.6 g/dL (ref 3.8–4.8)
Alkaline Phosphatase: 47 IU/L (ref 44–121)
BUN/Creatinine Ratio: 19 (ref 12–28)
BUN: 15 mg/dL (ref 8–27)
Bilirubin Total: 0.4 mg/dL (ref 0.0–1.2)
CO2: 22 mmol/L (ref 20–29)
Calcium: 9.4 mg/dL (ref 8.7–10.3)
Chloride: 104 mmol/L (ref 96–106)
Creatinine, Ser: 0.8 mg/dL (ref 0.57–1.00)
Globulin, Total: 2.1 g/dL (ref 1.5–4.5)
Glucose: 98 mg/dL (ref 65–99)
Potassium: 4.4 mmol/L (ref 3.5–5.2)
Sodium: 144 mmol/L (ref 134–144)
Total Protein: 6.7 g/dL (ref 6.0–8.5)
eGFR: 82 mL/min/{1.73_m2} (ref >59)

## 2021-02-27 ENCOUNTER — Telehealth: Payer: Self-pay | Admitting: Neurology

## 2021-02-27 NOTE — Telephone Encounter (Signed)
Pt asking for a call to discuss coming off of AUBAGIO 7 MG TABS

## 2021-03-02 ENCOUNTER — Ambulatory Visit: Payer: Self-pay | Admitting: Neurology

## 2021-03-02 NOTE — Telephone Encounter (Signed)
I called pt and she is still having issues with hair loss and wants to come in and discuss other options.  I relayed that can make appt for Dr. Krista Blue in 6 months 05/2021 and she did not want to wait that long.  I was able to place her on SS/NP schedule for this afternoon.

## 2021-03-03 ENCOUNTER — Other Ambulatory Visit: Payer: Self-pay | Admitting: *Deleted

## 2021-03-03 MED ORDER — AUBAGIO 7 MG PO TABS
1.0000 | ORAL_TABLET | Freq: Every day | ORAL | 3 refills | Status: DC
Start: 2021-03-03 — End: 2022-05-26

## 2021-03-03 NOTE — Telephone Encounter (Signed)
Printed PAP MS one to one.  [phone], [phone]. Aubagio 7 mg daily #90 refill #3.  To Dr. Krista Blue to sign.

## 2021-03-04 ENCOUNTER — Telehealth: Payer: Self-pay | Admitting: *Deleted

## 2021-03-04 NOTE — Telephone Encounter (Signed)
PAP MS one ot one [phone] Aubagio needing prescription signed by Dr. Krista Blue.  Done and fax confirmation received 03-03-21.

## 2021-05-15 ENCOUNTER — Ambulatory Visit: Payer: Medicare Other | Admitting: Nurse Practitioner

## 2021-05-15 ENCOUNTER — Encounter: Payer: Medicare Other | Admitting: Obstetrics and Gynecology

## 2021-05-18 ENCOUNTER — Other Ambulatory Visit: Payer: Self-pay | Admitting: Internal Medicine

## 2021-05-18 DIAGNOSIS — E785 Hyperlipidemia, unspecified: Secondary | ICD-10-CM

## 2021-05-20 ENCOUNTER — Ambulatory Visit (INDEPENDENT_AMBULATORY_CARE_PROVIDER_SITE_OTHER): Payer: Medicare Other | Admitting: Nurse Practitioner

## 2021-05-20 ENCOUNTER — Encounter: Payer: Self-pay | Admitting: Nurse Practitioner

## 2021-05-20 ENCOUNTER — Other Ambulatory Visit: Payer: Self-pay

## 2021-05-20 VITALS — BP 110/70 | Ht 64.25 in | Wt 118.0 lb

## 2021-05-20 DIAGNOSIS — M810 Age-related osteoporosis without current pathological fracture: Secondary | ICD-10-CM

## 2021-05-20 DIAGNOSIS — Z01419 Encounter for gynecological examination (general) (routine) without abnormal findings: Secondary | ICD-10-CM

## 2021-05-20 DIAGNOSIS — Z780 Asymptomatic menopausal state: Secondary | ICD-10-CM

## 2021-05-20 DIAGNOSIS — N952 Postmenopausal atrophic vaginitis: Secondary | ICD-10-CM

## 2021-05-20 MED ORDER — PREMARIN 0.625 MG/GM VA CREA: TOPICAL_CREAM | VAGINAL | 11 refills | Status: DC

## 2021-05-20 NOTE — Progress Notes (Signed)
   Ana Russell 03-24-1956 [id number]   History:  65 y.o. G2P0020 presents for breast and pelvic exam. Postmenopausal. Using Premarin for vaginal atrophy/dryness. Normal pap and mammogram history. Osteoporosis, Fosamax recommended but patient declined and wants to optimize Vitamin D/Calcium and exercise. She does not want anymore screenings. History of MS, hypothyroidism.   Gynecologic History No LMP recorded. Patient is postmenopausal.   Contraception: post menopausal status Sexually active: Yes  Health Maintenance Last Pap: 04/12/2019. Results were: Normal Last mammogram: 05/08/2020. Results were: Normal Last colonoscopy: 2018? Last Dexa: 07/15/2020. Results were: T-score -2.7  Past medical history, past surgical history, family history and social history were all reviewed and documented in the EPIC chart. Married. Artist.   ROS:  A ROS was performed and pertinent positives and negatives are included.  Exam:  Vitals:   05/20/21 0959  BP: 110/70  Weight: 118 lb (53.5 kg)  Height: 5' 4.25" (1.632 m)   Body mass index is 20.1 kg/m.  General appearance:  Normal Thyroid:  Symmetrical, normal in size, without palpable masses or nodularity. Respiratory  Auscultation:  Clear without wheezing or rhonchi Cardiovascular  Auscultation:  Regular rate, without rubs, murmurs or gallops  Edema/varicosities:  Not grossly evident Abdominal  Soft,nontender, without masses, guarding or rebound.  Liver/spleen:  No organomegaly noted  Hernia:  None appreciated  Skin  Inspection:  Grossly normal Breasts: Examined lying and sitting.   Right: Without masses, retractions, nipple discharge or axillary adenopathy.   Left: Without masses, retractions, nipple discharge or axillary adenopathy. Genitourinary   Inguinal/mons:  Normal without inguinal adenopathy  External genitalia:  Normal appearing vulva with no masses, tenderness, or lesions  BUS/Urethra/Skene's glands:  Normal  Vagina:   Atrophic changes  Cervix:  Normal appearing without discharge or lesions  Uterus:  Normal in size, shape and contour.  Midline and mobile, nontender  Adnexa/parametria:     Rt: Normal in size, without masses or tenderness.   Lt: Normal in size, without masses or tenderness.  Anus and perineum: Normal  Digital rectal exam: Normal sphincter tone without palpated masses or tenderness  Patient informed chaperone available to be present for breast and pelvic exam. Patient has requested no chaperone to be present. Patient has been advised what will be completed during breast and pelvic exam.   Assessment/Plan:  65 y.o. G2P0020 for breast and pelvic exam.   Well female exam with routine gynecological exam - Education provided on SBEs, importance of preventative screenings, current guidelines, high calcium diet, regular exercise, and multivitamin daily. Labs with PCP.   Postmenopausal  Postmenopausal atrophic vaginitis - Plan: conjugated estrogens (PREMARIN) vaginal cream twice weekly.   Age-related osteoporosis without current pathological fracture - has had discussions regarding medication management but she declines. She wants to continue to optimize Vitamin D supplement, high calcium diet, and exercise. She is not interested in continuing bone density screenings. Discussed home safety and fall prevention.   Screening for cervical cancer - Normal Pap history.  Discussed current guidelines and the option to scop screenings and she is agreeable.   Screening for breast cancer - Normal mammogram history.  Continue annual screenings.  Normal breast exam today.  Screening for colon cancer - 2018 colonoscopy? Will repeat at GI's recommended interval.   Return in 1 year for breast and pelvic exam.   Tamela Gammon DNP, 10:28 AM 05/20/2021

## 2021-06-08 ENCOUNTER — Ambulatory Visit
Admission: RE | Admit: 2021-06-08 | Discharge: 2021-06-08 | Disposition: A | Discharge status: Home / Self Care | Payer: No Typology Code available for payment source | Source: Ambulatory Visit | Attending: Internal Medicine | Admitting: Internal Medicine

## 2021-06-08 DIAGNOSIS — E785 Hyperlipidemia, unspecified: Secondary | ICD-10-CM

## 2021-06-11 ENCOUNTER — Other Ambulatory Visit: Payer: Self-pay | Admitting: Internal Medicine

## 2021-06-11 DIAGNOSIS — R918 Other nonspecific abnormal finding of lung field: Secondary | ICD-10-CM

## 2021-06-22 ENCOUNTER — Other Ambulatory Visit: Payer: Self-pay | Admitting: Internal Medicine

## 2021-06-22 DIAGNOSIS — Z1231 Encounter for screening mammogram for malignant neoplasm of breast: Secondary | ICD-10-CM

## 2021-06-25 ENCOUNTER — Ambulatory Visit
Admission: RE | Admit: 2021-06-25 | Discharge: 2021-06-25 | Disposition: A | Discharge status: Home / Self Care | Payer: Medicare Other | Source: Ambulatory Visit | Attending: Internal Medicine | Admitting: Internal Medicine

## 2021-06-25 ENCOUNTER — Other Ambulatory Visit: Payer: Self-pay

## 2021-06-25 DIAGNOSIS — Z1231 Encounter for screening mammogram for malignant neoplasm of breast: Secondary | ICD-10-CM

## 2021-06-29 ENCOUNTER — Telehealth: Payer: Self-pay | Admitting: Neurology

## 2021-06-29 ENCOUNTER — Telehealth: Payer: Self-pay

## 2021-06-29 NOTE — Telephone Encounter (Signed)
I called patient. She is complaining of persistent hair loss on aubagio which has been discussed with Judson Roch, NP at the last visit. Unfortunately, patient reports that her eyebrows are now losing hair as well. She would like to discuss alternative DMT options or perhaps an even lower dose of aubagio. I recommended an office visit for this since blood work may need to be done prior to new DMT. Patient is agreeable to this and an appointment was scheduled for 06/30/2021 with Jinny Blossom, NP.

## 2021-06-29 NOTE — Telephone Encounter (Signed)
Pt called wanting to take something other than the AUBAGIO 7 MG TABS and also a lower dose.

## 2021-06-29 NOTE — Telephone Encounter (Signed)
Patient called regarding refilling her Premarin Cream.  Unable to get a refill.  I called [facility] and she has a prescription for the year sent in Sept. And they actually have it filled and waiting for her.  Pharmacy said it looked like she was requesting refill on her old Rx #.   I spoke with patient and informed her new Rx ready for pick up and has refills for the year.

## 2021-06-30 ENCOUNTER — Ambulatory Visit (INDEPENDENT_AMBULATORY_CARE_PROVIDER_SITE_OTHER): Payer: Medicare Other | Admitting: Adult Health

## 2021-06-30 ENCOUNTER — Encounter: Payer: Self-pay | Admitting: Adult Health

## 2021-06-30 VITALS — BP 132/81 | HR 74 | Ht 61.0 in | Wt 120.8 lb

## 2021-06-30 DIAGNOSIS — G35 Multiple sclerosis: Secondary | ICD-10-CM | POA: Diagnosis not present

## 2021-06-30 NOTE — Progress Notes (Signed)
PATIENT: Ana Russell DOB: May 04, 1956  REASON FOR VISIT: follow up HISTORY FROM: patient   HISTORY OF PRESENT ILLNESS: Today 06/30/21:  Ana Russell is a 65 year old female with a history of multiple sclerosis.  She returns today for follow-up.  Overall she feels that she has remained stable.  She continues to have numbness in the feet but this is not new.  She reports that she has a floater in the left eye but is followed by ophthalmology.  Denies any significant changes in her gait or balance.  She has 3 flights of stairs at home that she is able to use.  She continues to exercise regularly.  The patient's last MRI of the brain was in 2017 and was stable.  The patient's main concern is Ana Russell is causing hair loss.  Ultimately the patient would like to be off of medication completely.  HISTORY December 03, 2020 SS: Here today unaccompanied, has been on Aubagio only 7 mg daily since June 2021, tolerating well, MS stable, with the exception of hair loss.  Having left eye issue,sees throbbing in the left eye, seeing eye doctor in the next few days, had something similar with fluid to the eye.  Remains active, getting ready for her garden, painting, walks daily, yesterday walks 3 miles, only holds her cane, doesn't use it.  No falls. LFT in Dec 2021 was normal. Her paintings are going to be featured in the senior center next month  REVIEW OF SYSTEMS: Out of a complete 14 system review of symptoms, the patient complains only of the following symptoms, and all other reviewed systems are negative.  ALLERGIES: No Known Allergies  HOME MEDICATIONS: Outpatient Medications Prior to Visit  Medication Sig Dispense Refill   Ascorbic Acid (VITAMIN C PO) Take by mouth.     AUBAGIO 7 MG TABS Take 1 tablet by mouth daily. 90 tablet 3   Biotin 1 MG CAPS Take by mouth.     Cholecalciferol (VITAMIN D PO) Take by mouth.     conjugated estrogens (PREMARIN) vaginal cream Insert 1 gram vaginally nightly for  2 weeks then use 1 gram vaginally twice weekly. 42.5 g 11   rosuvastatin (CRESTOR) 5 MG tablet Take 5 mg by mouth daily.     SYNTHROID 100 MCG tablet 100 mg daily.  0   No facility-administered medications prior to visit.    PAST MEDICAL HISTORY: Past Medical History:  Diagnosis Date   C. difficile colitis 2013   Endometrial hyperplasia without atypia, simple 09/2003   NORMAL ENDOMETRIAL BIOPSY 03/2005   Fluid retention    in ears   Hypothyroidism    Meniere disease    MS (multiple sclerosis) ([place]) 02/25/2011   Osteoporosis 01/2018   T score -2.7    PAST SURGICAL HISTORY: Past Surgical History:  Procedure Laterality Date   DILATION AND CURETTAGE OF UTERUS     TAB   HYSTEROSCOPY  09/2003   POLYPS   KNEE SURGERY  2011   bilateral knee scopes   PELVIC LAPAROSCOPY     THYROIDECTOMY, PARTIAL      FAMILY HISTORY: Family History  Problem Relation Age of Onset   Diabetes Mother    Cancer Sister        Thyroid   Heart attack Sister    Breast cancer Maternal Aunt    Diabetes Paternal Grandfather    Multiple sclerosis Neg Hx     SOCIAL HISTORY: Social History   Socioeconomic History   Marital status:  Married    Spouse name: Ed   Number of children: 0   Years of education: Bachelors   Highest education level: Not on file  Occupational History   Not on file  Tobacco Use   Smoking status: Former   Smokeless tobacco: Never  Vaping Use   Vaping Use: Never used  Substance and Sexual Activity   Alcohol use: Yes    Alcohol/week: 6.0 standard drinks    Types: 6 Glasses of wine per week    Comment: TUES THURS SAT SUN WINE   Drug use: No   Sexual activity: Yes    Birth control/protection: Post-menopausal    Comment: 1st intercourse 64 yo-5 partners  Other Topics Concern   Not on file  Social History Narrative   Patient lives at home with husband (Ed)   Patient is right Buyer, retail degree   Caffeine consumption is 3 cups daily   Social  Determinants of Radio broadcast assistant Strain: Not on file  Food Insecurity: Not on file  Transportation Needs: Not on file  Physical Activity: Not on file  Stress: Not on file  Social Connections: Not on file  Intimate Partner Violence: Not on file      PHYSICAL EXAM  Vitals:   06/30/21 0945  BP: 132/81  Pulse: 74  Weight: 120 lb 12.8 oz (54.8 kg)  Height: 5\' 1"  (1.549 m)   Body mass index is 22.82 kg/m.  Generalized: Well developed, in no acute distress   Neurological examination  Mentation: Alert oriented to time, place, history taking. Follows all commands speech and language fluent Cranial nerve II-XII: Pupils were equal round reactive to light. Extraocular movements were full, visual field were full on confrontational test. Facial sensation and strength were normal. Uvula tongue midline. Head turning and shoulder shrug  were normal and symmetric. Motor: The motor testing reveals 5 over 5 strength of all 4 extremities. Good symmetric motor tone is noted throughout.  Sensory: Sensory testing is intact to soft touch on all 4 extremities. No evidence of extinction is noted.  Coordination: Cerebellar testing reveals good finger-nose-finger and heel-to-shin bilaterally.  Gait and station: Gait is normal.    DIAGNOSTIC DATA (LABS, IMAGING, TESTING) - I reviewed patient records, labs, notes, testing and imaging myself where available.  Lab Results  Component Value Date   WBC 3.6 12/04/2020   HGB 13.9 12/04/2020   HCT 43.6 12/04/2020   MCV 94 12/04/2020   PLT 171 12/04/2020      Component Value Date/Time   NA 144 12/04/2020 1109   K 4.4 12/04/2020 1109   CL 104 12/04/2020 1109   CO2 22 12/04/2020 1109   GLUCOSE 98 12/04/2020 1109   GLUCOSE 95 04/12/2019 0857   BUN 15 12/04/2020 1109   CREATININE 0.80 12/04/2020 1109   CREATININE 0.80 04/12/2019 0857   CALCIUM 9.4 12/04/2020 1109   PROT 6.7 12/04/2020 1109   ALBUMIN 4.6 12/04/2020 1109   AST 28 12/04/2020  1109   ALT 18 12/04/2020 1109   ALKPHOS 47 12/04/2020 1109   BILITOT 0.4 12/04/2020 1109   GFRNONAA 64 02/27/2020 0834   GFRAA 74 02/27/2020 0834   Lab Results  Component Value Date   CHOL 207 (H) 04/12/2019   HDL 75 04/12/2019   LDLCALC 115 (H) 04/12/2019   LDLDIRECT 121.7 02/25/2011   TRIG 80 04/12/2019   CHOLHDL 2.8 04/12/2019   Lab Results  Component Value Date   HGBA1C 6.1 (H) 02/27/2020  Lab Results  Component Value Date   [id number] 458 02/27/2020   Lab Results  Component Value Date   TSH 4.310 02/27/2020      ASSESSMENT AND PLAN 65 y.o. year old female  has a past medical history of C. difficile colitis (2013), Endometrial hyperplasia without atypia, simple (09/2003), Fluid retention, Hypothyroidism, Meniere disease, MS (multiple sclerosis) ([place]) (02/25/2011), and Osteoporosis (01/2018). here with:  1.  Multiple sclerosis  Discussed different medication options including Tecfidera, and Zeposia and Gilenya.  She was given information regarding all of these medications We will complete MRI of the brain with and without contrast to look for progression. Ultimately the patient would like to be off of all medication.  Advised that we could discuss further pending results of MRI of the brain. Follow-up in 3 to 4 months or sooner if needed     Ward Givens, MSN, NP-C 06/30/2021, 9:59 AM [facility] [address], [place],  [address] [phone]

## 2021-06-30 NOTE — Patient Instructions (Signed)
Your Plan:  MRI brain  Consider Zeposia, Tecfidera and Gilenya If your symptoms worsen or you develop new symptoms please let us know.   Thank you for coming to see [place] at [facility]. I hope we have been able to provide you high quality care today.  You may receive a patient satisfaction survey over the next few weeks. We would appreciate your feedback and comments so that we may continue to improve ourselves and the health of our patients.

## 2021-07-15 ENCOUNTER — Ambulatory Visit
Admission: RE | Admit: 2021-07-15 | Discharge: 2021-07-15 | Disposition: A | Discharge status: Home / Self Care | Payer: Medicare Other | Source: Ambulatory Visit | Attending: Adult Health | Admitting: Adult Health

## 2021-07-15 ENCOUNTER — Other Ambulatory Visit: Payer: Self-pay

## 2021-07-15 DIAGNOSIS — G35 Multiple sclerosis: Secondary | ICD-10-CM | POA: Diagnosis not present

## 2021-07-15 MED ORDER — GADOBENATE DIMEGLUMINE 529 MG/ML IV SOLN
11.0000 mL | Freq: Once | INTRAVENOUS | Status: AC | PRN
  Administered 2021-07-15: 11 mL via INTRAVENOUS

## 2021-07-21 ENCOUNTER — Telehealth: Payer: Self-pay | Admitting: Neurology

## 2021-07-21 NOTE — Telephone Encounter (Signed)
Pt called states she needs a second educated opinion from her MRI results. Pt requesting Dr. Felecia Shelling. Pt requesting a call back.

## 2021-07-21 NOTE — Telephone Encounter (Signed)
Okay I can see her.  Since she is in her mid 32s and her MRIs have been stable she could consider stopping her disease modifying therapy

## 2021-07-21 NOTE — Telephone Encounter (Signed)
I called the patient and she would like to transfer care to Dr. Felecia Shelling. She is considering coming off her medication and would like to see him to discuss the next step.

## 2021-07-21 NOTE — Telephone Encounter (Signed)
Ok for her see Dr. Felecia Shelling

## 2021-07-22 NOTE — Telephone Encounter (Signed)
I called the patient. She was scheduled in an open slot on 07/29/21.

## 2021-07-23 ENCOUNTER — Telehealth: Payer: Self-pay | Admitting: *Deleted

## 2021-07-23 NOTE — Telephone Encounter (Signed)
-----   Message from Ward Givens, NP sent at 07/20/2021  5:17 PM EST ----- Please call patient. Let her know scan is stable however I discussed with Dr. Krista Blue she does not want to discontinue medication d/t lots of lesions. We can discuss other medication options. (Discussed in the office visit already)

## 2021-07-23 NOTE — Telephone Encounter (Signed)
I spoke to the patient and provided her with the results. She is transitioning care to Dr. Felecia Shelling who will discuss it further at her upcoming appt.

## 2021-07-29 ENCOUNTER — Ambulatory Visit (INDEPENDENT_AMBULATORY_CARE_PROVIDER_SITE_OTHER): Payer: Medicare Other | Admitting: Neurology

## 2021-07-29 ENCOUNTER — Encounter: Payer: Self-pay | Admitting: Neurology

## 2021-07-29 VITALS — BP 151/87 | HR 79 | Ht 64.0 in | Wt 122.2 lb

## 2021-07-29 DIAGNOSIS — Z79899 Other long term (current) drug therapy: Secondary | ICD-10-CM | POA: Insufficient documentation

## 2021-07-29 DIAGNOSIS — R269 Unspecified abnormalities of gait and mobility: Secondary | ICD-10-CM

## 2021-07-29 DIAGNOSIS — G35 Multiple sclerosis: Secondary | ICD-10-CM | POA: Diagnosis not present

## 2021-07-29 NOTE — Patient Instructions (Signed)
We discussed stopping the Aubagio.  Her MS has been stable for many years.  The MRI from 2017 was unchanged compared to the MRI from 2009.  The MRI that was done recently was unchanged compared to the MRI from 2017.  MS generally becomes less aggressive with age, especially if somebody has had MS for many years.  Ana Russell has a fairly long half-life and will take several months to completely get out of your body.  Therefore, you do not have to titrate the dose.  You can just stop the medication.  When I do take people off of their medications I like to repeat an MRI about 1 year later and then repeat another MRI about a year after that.  That assures [place] that your MS is not active when you are off of medication.

## 2021-07-29 NOTE — Progress Notes (Signed)
[facility]  PATIENT: Ana Russell DOB: [birth year]  REFERRING DOCTOR OR PCP:   Dr. Deland Pretty SOURCE: Patient, notes from Dr. Krista Blue, imaging and lab reports, MRI images personally reviewed  _________________________________   HISTORICAL  CHIEF COMPLAINT:  Chief Complaint  Patient presents with   Consult    Rm 16, alone. Here for transfer of care from Dr. Krista Blue. On Aubagio for MS, pt would like to d/c medication.     HISTORY OF PRESENT ILLNESS:  Ana Russell  is a 65 yo woman who was diagnosed with multiple sclerosis in 1991 but did not start any disease modifying therapy and her weighted labs in 2009.  She is currently on Aubagio but would like to consider stopping disease modifying therapies.    She had no neurologic issues until she developed diplopia in 40 at age 22.   She had an abnormal MRI and was told she might have MS but due to no DMT at that time, she ws not placed on a DMT.   She did try bee sting and changes in diet.   She moved to [facility] and did well a while.  She went to the [facility] clinic in the late 1990s when she had mild gait issues and was told there was no MS in her spinal cord.    She had a large exacerbation in 2009.   She had difficulty with speech and writing.   Dr. Krista Blue started to see her.    She was placed on Rebif and had done well x 10 years but had a lot of issues with skin reactions   She opted to switch to an oral medication.   She has been on Aubagio x 18 months and has had hair loss including eye brows.     Currently, she uses a walking stick for balance (carries it as much as using it).  She has osteoporosis so is concerned about falls.      She can walk 3 miles straight and does so every other day and goes to a gym the other days.     She tries to eat well.   She denies dysesthesias but has mild foot numbness.   Legs are strong.    Vision is fine.   She has some bladder urgency but no incontinence.  She will have some constipation but  notes this has been a lifelong issue.  She denies significant fatigue and remains active exercising most days.  She gardens periodically.  She does note some sleep maintenance insomnia.  Sometimes she will wake up for couple hours at night.  Sleep onset is fine.  She denies any difficulties with mood or cognition in general.  Though she does have occasional reduced short-term memory.  Imaging reviewed: MRi of the brain in 2017 was unchanged from 2009 and the MRI 07/16/2021 was unchanged from 2017.   MRI of the cervical and thoracic spine.   All of the MRIs show multiple T2/FLAIR hyperintense foci, many in the periventricular white matter.  None of the foci appear to be acute.  MRI of the cervical and thoracic spine from 2009 was also reviewed.  The spinal cord is normal.  REVIEW OF SYSTEMS: Constitutional: No fevers, chills, sweats, or change in appetite Eyes: No visual changes, double vision, eye pain Ear, nose and throat: No hearing loss, ear pain, nasal congestion, sore throat Cardiovascular: No chest pain, palpitations Respiratory:  No shortness of breath at rest or with exertion.  No wheezes GastrointestinaI: No nausea, vomiting, diarrhea, abdominal pain, fecal incontinence Genitourinary:  No dysuria, urinary retention or frequency.  No nocturia. Musculoskeletal:  No neck pain, back pain Integumentary: No rash, pruritus, skin lesions Neurological: as above Psychiatric: No depression at this time.  No anxiety Endocrine: No palpitations, diaphoresis, change in appetite, change in weigh or increased thirst Hematologic/Lymphatic:  No anemia, purpura, petechiae. Allergic/Immunologic: No itchy/runny eyes, nasal congestion, recent allergic reactions, rashes  ALLERGIES: No Known Allergies  HOME MEDICATIONS:  Current Outpatient Medications:    Ascorbic Acid (VITAMIN C PO), Take by mouth., Disp: , Rfl:    aspirin EC 81 MG tablet, Take 81 mg by mouth daily. Swallow whole., Disp: , Rfl:     AUBAGIO 7 MG TABS, Take 1 tablet by mouth daily., Disp: 90 tablet, Rfl: 3   Biotin 1 MG CAPS, Take by mouth., Disp: , Rfl:    Cholecalciferol (VITAMIN D PO), Take by mouth., Disp: , Rfl:    conjugated estrogens (PREMARIN) vaginal cream, Insert 1 gram vaginally nightly for 2 weeks then use 1 gram vaginally twice weekly., Disp: 42.5 g, Rfl: 11   rosuvastatin (CRESTOR) 5 MG tablet, Take 5 mg by mouth daily., Disp: , Rfl:    SYNTHROID 100 MCG tablet, 100 mg daily., Disp: , Rfl: 0  PAST MEDICAL HISTORY: Past Medical History:  Diagnosis Date   C. difficile colitis 2013   Endometrial hyperplasia without atypia, simple 09/2003   NORMAL ENDOMETRIAL BIOPSY 03/2005   Fluid retention    in ears   Hypothyroidism    Meniere disease    MS (multiple sclerosis) ([place]) 02/25/2011   Osteoporosis 01/2018   T score -2.7    PAST SURGICAL HISTORY: Past Surgical History:  Procedure Laterality Date   DILATION AND CURETTAGE OF UTERUS     TAB   HYSTEROSCOPY  09/2003   POLYPS   KNEE SURGERY  2011   bilateral knee scopes   PELVIC LAPAROSCOPY     THYROIDECTOMY, PARTIAL      FAMILY HISTORY: Family History  Problem Relation Age of Onset   Diabetes Mother    Cancer Sister        Thyroid   Heart attack Sister    Breast cancer Maternal Aunt    Diabetes Paternal Grandfather    Multiple sclerosis Neg Hx     SOCIAL HISTORY:  Social History   Socioeconomic History   Marital status: Married    Spouse name: Ed   Number of children: 0   Years of education: Bachelors   Highest education level: Not on file  Occupational History   Not on file  Tobacco Use   Smoking status: Former   Smokeless tobacco: Never  Vaping Use   Vaping Use: Never used  Substance and Sexual Activity   Alcohol use: Yes    Alcohol/week: 6.0 standard drinks    Types: 6 Glasses of wine per week    Comment: TUES THURS SAT SUN WINE   Drug use: No   Sexual activity: Yes    Birth control/protection: Post-menopausal    Comment:  1st intercourse 60 yo-5 partners  Other Topics Concern   Not on file  Social History Narrative   Patient lives at home with husband (Ed)   Patient is right handed   Education level Bachelors degree   Caffeine consumption is 3 cups daily   Social Determinants of Radio broadcast assistant Strain: Not on file  Food Insecurity: Not on file  Transportation Needs: Not  on file  Physical Activity: Not on file  Stress: Not on file  Social Connections: Not on file  Intimate Partner Violence: Not on file     PHYSICAL EXAM  Vitals:   07/29/21 0839  BP: (!) 151/87  Pulse: 79  Weight: 122 lb 3.2 oz (55.4 kg)  Height: 5\' 4"  (1.626 m)    Body mass index is 20.98 kg/m.   General: The patient is well-developed and well-nourished and in no acute distress  HEENT:  Head is /AT.  Sclera are anicteric.  Funduscopic exam shows normal optic discs and retinal vessels.  Neck: No carotid bruits are noted.  The neck is nontender.  Cardiovascular: The heart has a regular rate and rhythm with a normal S1 and S2. There were no murmurs, gallops or rubs.    Skin: Extremities are without rash or  edema.  Musculoskeletal:  Back is nontender  Neurologic Exam  Mental status: The patient is alert and oriented x 3 at the time of the examination. The patient has apparent normal recent and remote memory, with an apparently normal attention span and concentration ability.   Speech is normal.  Cranial nerves: Extraocular movements are full. Pupils are equal, round, and reactive to light and accomodation.  Visual fields are full.  Facial symmetry is present. There is good facial sensation to soft touch bilaterally.Facial strength is normal.  Trapezius and sternocleidomastoid strength is normal. No dysarthria is noted.  The tongue is midline, and the patient has symmetric elevation of the soft palate. No obvious hearing deficits are noted.  Motor:  Muscle bulk is normal.   Tone is normal. Strength is  5 /  5 in all 4 extremities.   Sensory: Sensory testing is intact to pinprick, soft touch and vibration sensation in all 4 extremities.  Coordination: Cerebellar testing reveals good finger-nose-finger and heel-to-shin bilaterally.  Gait and station: Station is normal.   Her gait has a good stride.  Her turn is minimally off balance.  The tandem gait is mildly wide.  Romberg is negative.   Reflexes: Deep tendon reflexes are symmetric and normal bilaterally.   Plantar responses are flexor.    DIAGNOSTIC DATA (LABS, IMAGING, TESTING) - I reviewed patient records, labs, notes, testing and imaging myself where available.  Lab Results  Component Value Date   WBC 3.6 12/04/2020   HGB 13.9 12/04/2020   HCT 43.6 12/04/2020   MCV 94 12/04/2020   PLT 171 12/04/2020      Component Value Date/Time   NA 144 12/04/2020 1109   K 4.4 12/04/2020 1109   CL 104 12/04/2020 1109   CO2 22 12/04/2020 1109   GLUCOSE 98 12/04/2020 1109   GLUCOSE 95 04/12/2019 0857   BUN 15 12/04/2020 1109   CREATININE 0.80 12/04/2020 1109   CREATININE 0.80 04/12/2019 0857   CALCIUM 9.4 12/04/2020 1109   PROT 6.7 12/04/2020 1109   ALBUMIN 4.6 12/04/2020 1109   AST 28 12/04/2020 1109   ALT 18 12/04/2020 1109   ALKPHOS 47 12/04/2020 1109   BILITOT 0.4 12/04/2020 1109   GFRNONAA 64 02/27/2020 0834   GFRAA 74 02/27/2020 0834   Lab Results  Component Value Date   CHOL 207 (H) 04/12/2019   HDL 75 04/12/2019   LDLCALC 115 (H) 04/12/2019   LDLDIRECT 121.7 02/25/2011   TRIG 80 04/12/2019   CHOLHDL 2.8 04/12/2019   Lab Results  Component Value Date   HGBA1C 6.1 (H) 02/27/2020   Lab Results  Component Value Date  [id number] 345 02/27/2020   Lab Results  Component Value Date   TSH 4.310 02/27/2020       ASSESSMENT AND PLAN  MS (multiple sclerosis) ([place])  Gait difficulty  High risk medication use  Ms. Passey is a 65 year old woman with a 31-year history of multiple sclerosis who would like to  consider stopping the disease modifying therapy, Aubagio.  Her MS has been very stable since 2009 when she had an exacerbation.  MRIs have shown no definite new lesions for the last 12 years.  Therefore, between her age, length of MS and stability, it is clear that she does not have an aggressive MS at this time.  There is a high probability that she will be able to stop the disease modifying therapy and continue to do well.  She does not need to taper the Aubagio but can just stop.  It will take a couple months to completely get out of her body. We will check an MRI towards the end of 2022, preferably before her next visit with me.  This will allow [place] to determine if there has been any subclinical activity.  If this is occurring we would need to reconsider a disease modifying therapy.  We will likely need to check a couple MRIs over the next few years. Stay active and exercise as tolerated. Return in 1 year or sooner if there are new or worsening neurologic symptoms.  45-minute office visit with the majority of the time spent face-to-face for history and physical, discussion/counseling and decision-making.  Additional time with record review and documentation.    Maurene Hollin A. Felecia Shelling, MD, [facility] 29/56/2130, 86:57 AM Certified in Neurology, Clinical Neurophysiology, Sleep Medicine and Neuroimaging  [facility] [address] [place], [place] [address] [phone]

## 2021-09-14 ENCOUNTER — Ambulatory Visit: Payer: Medicare Other | Admitting: Neurology

## 2021-09-22 ENCOUNTER — Telehealth: Payer: Self-pay | Admitting: Neurology

## 2021-09-22 NOTE — Telephone Encounter (Signed)
Called pt back to further discuss. She read somewhere online that crestor/ASA causes breakdown on myelin sheath so she decided to stop these on her own w/o consulting doctor first. Unable to tell me where she read this/if it was reputable website. Advised it is ok from a neurological standpoint to be on these medications. She should call PCP to see if they want her to go back on these medications since they prescribed these. She verbalized understanding.

## 2021-09-22 NOTE — Telephone Encounter (Signed)
Pt has come off of rosuvastatin (CRESTOR) 5 MG tablet bcz it possibly affects MS Also aspirin EC 81 MG tablet because it affect digestive tract possibly.  Pt states she has been off these 2 for almost a month.  Pt has been taking Omega 3, a Multi vitamin and Tumeric for a week.  Pt would like a call to discuss .

## 2021-10-19 ENCOUNTER — Ambulatory Visit: Payer: Medicare Other | Admitting: Neurology

## 2021-10-28 ENCOUNTER — Other Ambulatory Visit: Payer: Self-pay

## 2021-10-28 ENCOUNTER — Ambulatory Visit
Admission: RE | Admit: 2021-10-28 | Discharge: 2021-10-28 | Disposition: A | Discharge status: Home / Self Care | Payer: Medicare Other | Source: Ambulatory Visit | Attending: Internal Medicine | Admitting: Internal Medicine

## 2021-10-28 DIAGNOSIS — R918 Other nonspecific abnormal finding of lung field: Secondary | ICD-10-CM

## 2022-03-05 IMAGING — MG DIGITAL SCREENING BILAT W/ TOMO W/ CAD
8 series · 9 of 24 positions shown · non-contrast
Comparison: Previous exam(s).

CLINICAL DATA: Screening.

EXAM:
DIGITAL SCREENING BILATERAL MAMMOGRAM WITH TOMO AND CAD

[R MLO synth-2D]
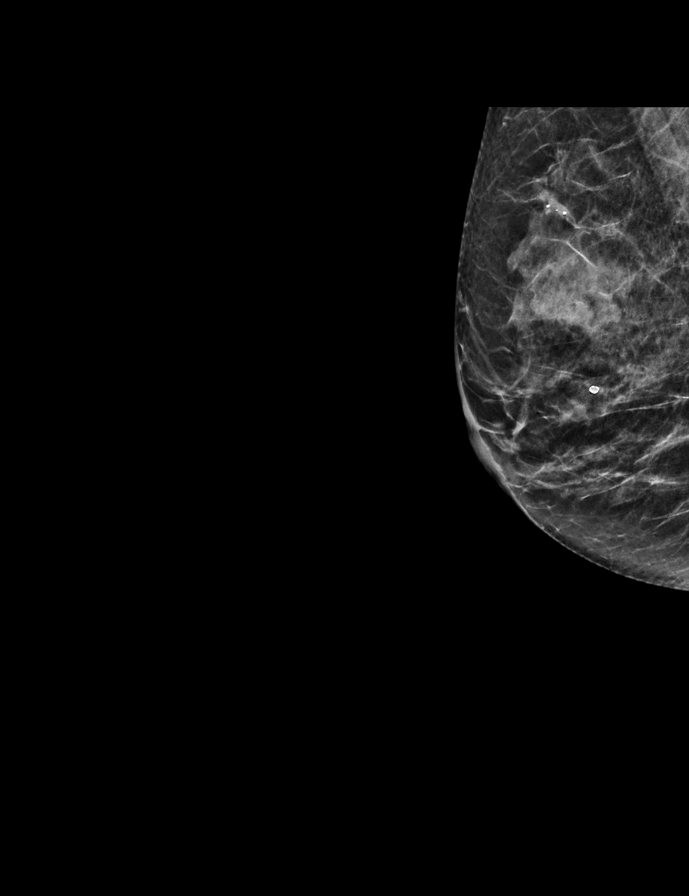

[L CC synth-2D]
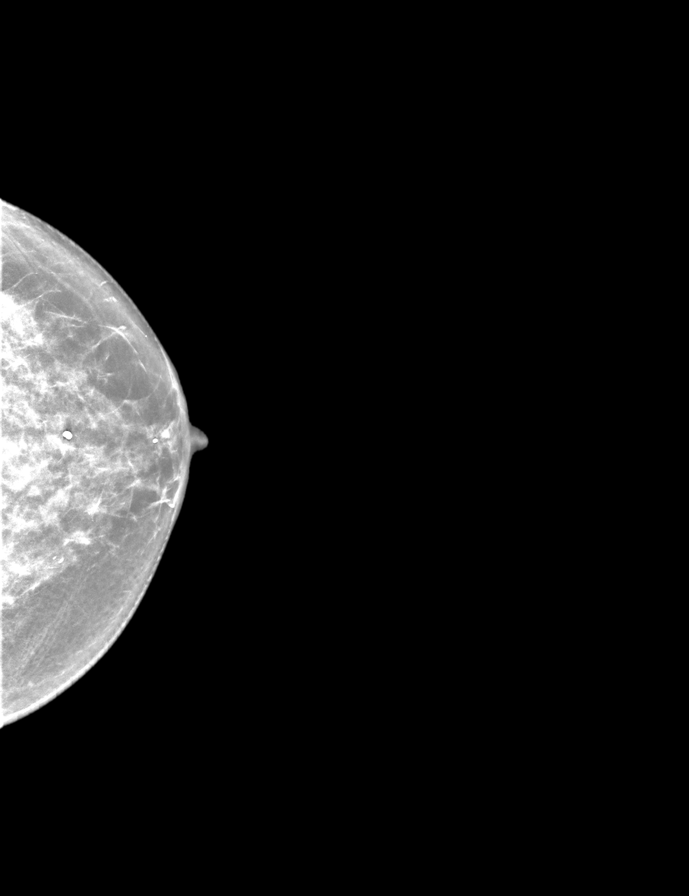

[R CC synth-2D]
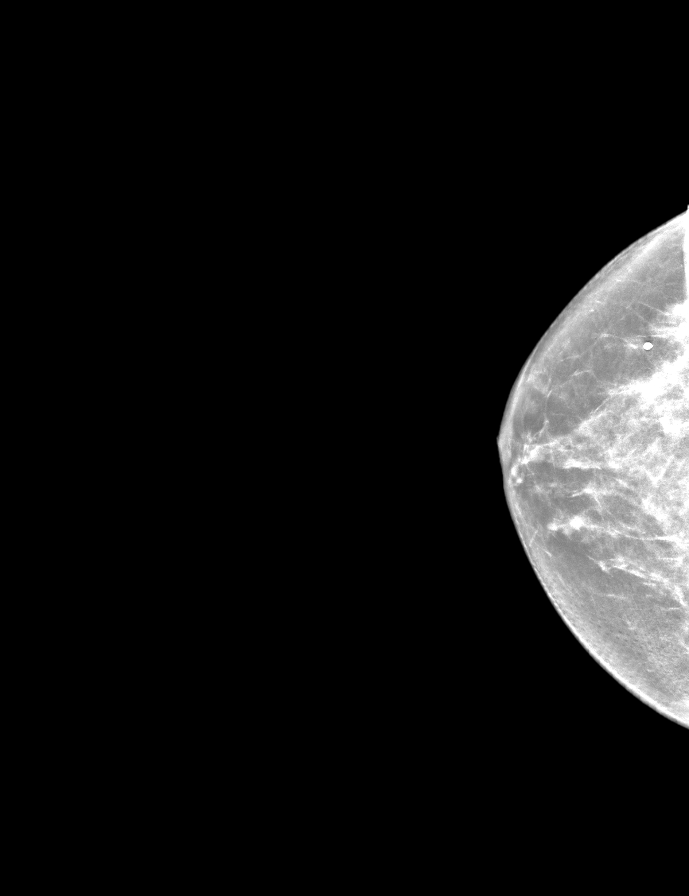

[L MLO synth-2D]
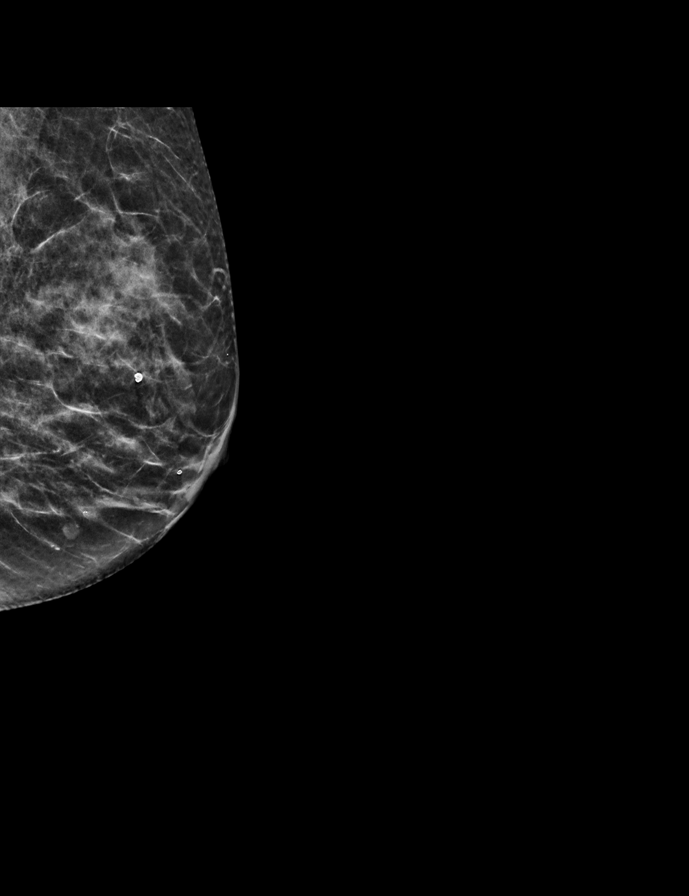

[L CC tomo · 2 of 55 frames shown]
[frame 18/55]
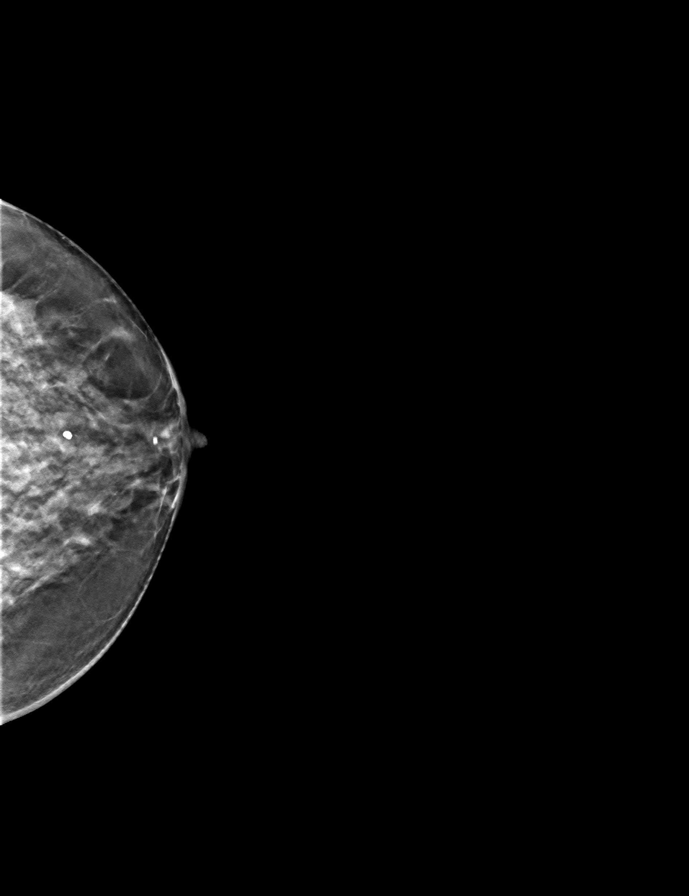
[frame 28/55]
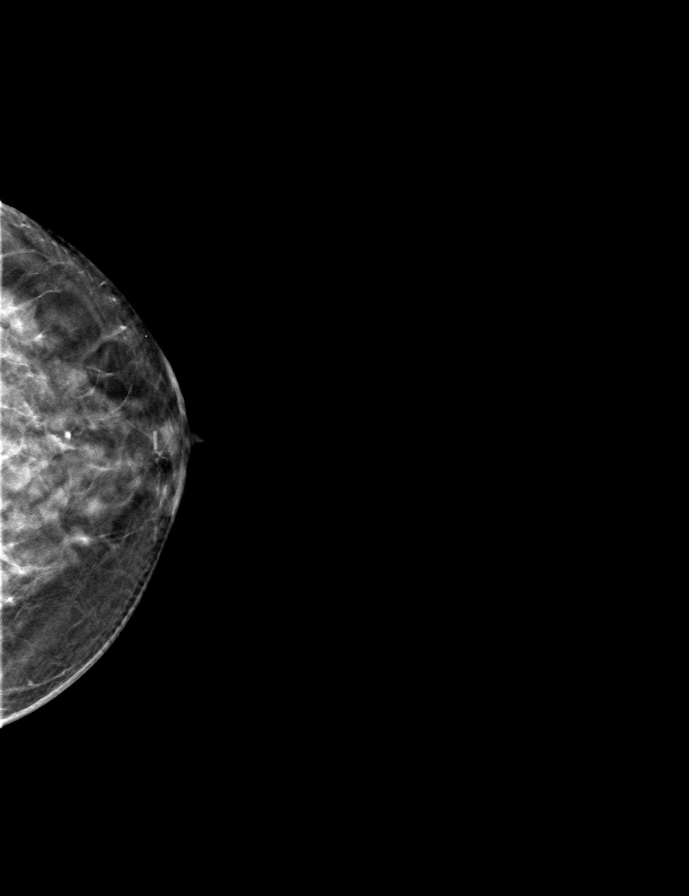

[L MLO tomo · tomo slice 25/49.0]
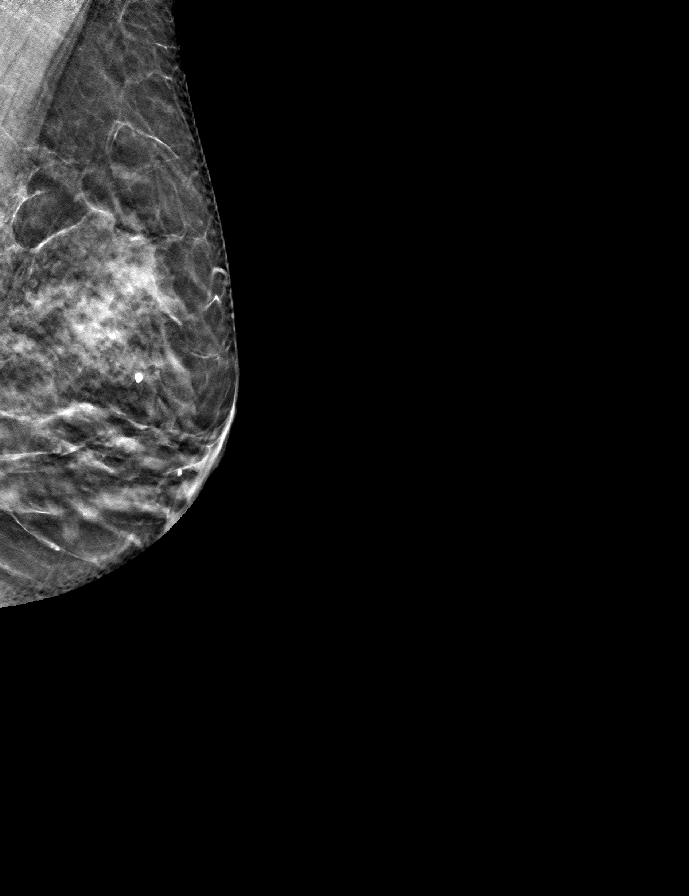

[R CC tomo · tomo slice 29/58.0]
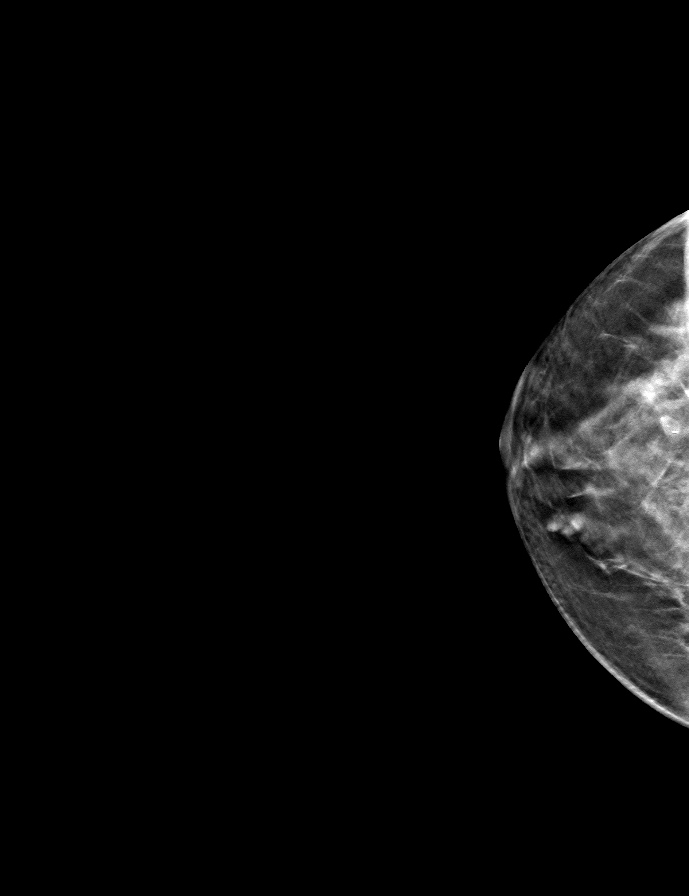

[R MLO tomo · tomo slice 25/48.0]
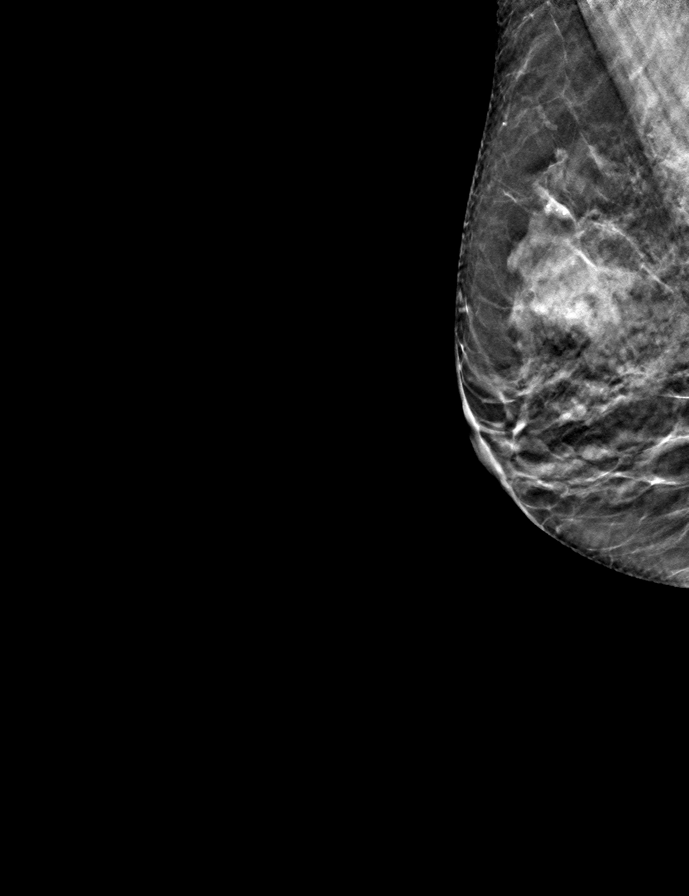

[9 of 24 positions shown; findings below may reference images not displayed]

ACR Breast Density Category c: The breast tissue is heterogeneously
dense, which may obscure small masses.
FINDINGS: There are no findings suspicious for malignancy. Images were
processed with CAD.
IMPRESSION: No mammographic evidence of malignancy. A result letter of this
screening mammogram will be mailed directly to the patient.

RECOMMENDATION:
Screening mammogram in one year. (Code:FT-U-LHB)

BI-RADS CATEGORY  1: Negative.

## 2022-03-26 ENCOUNTER — Encounter: Payer: Self-pay | Admitting: Neurology

## 2022-05-26 ENCOUNTER — Encounter: Payer: Self-pay | Admitting: Nurse Practitioner

## 2022-05-26 ENCOUNTER — Ambulatory Visit (INDEPENDENT_AMBULATORY_CARE_PROVIDER_SITE_OTHER): Payer: Medicare Other | Admitting: Nurse Practitioner

## 2022-05-26 VITALS — BP 132/82 | HR 76 | Ht 65.0 in | Wt 118.0 lb

## 2022-05-26 DIAGNOSIS — M810 Age-related osteoporosis without current pathological fracture: Secondary | ICD-10-CM | POA: Diagnosis not present

## 2022-05-26 DIAGNOSIS — N941 Unspecified dyspareunia: Secondary | ICD-10-CM | POA: Diagnosis not present

## 2022-05-26 DIAGNOSIS — Z01419 Encounter for gynecological examination (general) (routine) without abnormal findings: Secondary | ICD-10-CM

## 2022-05-26 DIAGNOSIS — Z9189 Other specified personal risk factors, not elsewhere classified: Secondary | ICD-10-CM

## 2022-05-26 DIAGNOSIS — N952 Postmenopausal atrophic vaginitis: Secondary | ICD-10-CM | POA: Diagnosis not present

## 2022-05-26 NOTE — Progress Notes (Signed)
Ana Russell 1956-01-30 [id number]   History:  66 y.o. G2P0020 presents for breast and pelvic exam. Postmenopausal. Stopped Premarin cream due to messiness. Tried to have intercourse a while back but felt like he was hitting something and it is painful but this did improve while using Premarin. She is wondering if there is something other than cream. Normal pap and mammogram history. 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 She has previously declined future bone density screenings. History of MS, hypothyroidism.   Gynecologic History No LMP recorded. Patient is postmenopausal.   Contraception: post menopausal status Sexually active: No  Health Maintenance Last Pap: 04/12/2019. Results were: Normal neg HR HPV Last mammogram: 06/25/2021. Results were: Normal Last colonoscopy: 2018 Last Dexa: 07/15/2020. Results were: T-score -2.7  Past medical history, past surgical history, family history and social history were all reviewed and documented in the EPIC chart. Married. Artist.   ROS:  A ROS was performed and pertinent positives and negatives are included.  Exam:  Vitals:   05/26/22 1029  BP: 132/82  Pulse: 76  SpO2: 98%  Weight: 118 lb (53.5 kg)  Height: '5\' 5"'$  (1.651 m)    Body mass index is 19.64 kg/m.  General appearance:  Normal Thyroid:  Symmetrical, normal in size, without palpable masses or nodularity. Respiratory  Auscultation:  Clear without wheezing or rhonchi Cardiovascular  Auscultation:  Regular rate, without rubs, murmurs or gallops  Edema/varicosities:  Not grossly evident Abdominal  Soft,nontender, without masses, guarding or rebound.  Liver/spleen:  No organomegaly noted  Hernia:  None appreciated  Skin  Inspection:  Grossly normal Breasts: Examined lying and sitting.   Right: Without masses, retractions, nipple discharge or axillary  adenopathy.   Left: Without masses, retractions, nipple discharge or axillary adenopathy. Genitourinary   Inguinal/mons:  Normal without inguinal adenopathy  External genitalia:  Normal appearing vulva with no masses, tenderness, or lesions  BUS/Urethra/Skene's glands:  Normal  Vagina:  Atrophic changes  Cervix:  Normal appearing without discharge or lesions  Uterus:  Normal in size, shape and contour.  Midline and mobile, nontender  Adnexa/parametria:     Rt: Normal in size, without masses or tenderness.   Lt: Normal in size, without masses or tenderness.  Anus and perineum: Normal  Digital rectal exam: Normal sphincter tone without palpated masses or tenderness  Patient informed chaperone available to be present for breast and pelvic exam. Patient has requested no chaperone to be present. Patient has been advised what will be completed during breast and pelvic exam.   Assessment/Plan:  66 y.o. G2P0020 for breast and pelvic exam.   Well female exam with routine gynecological exam - Education provided on SBEs, importance of preventative screenings, current guidelines, high calcium diet, regular exercise, and multivitamin daily. Labs with PCP.   Age-related osteoporosis without current pathological fracture - Plan: DG Bone Density. 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 She has previously declined future bone density screenings, but she would like to repeat DXA and consider treatment with Prolia.   Postmenopausal atrophic vaginitis - Stopped Premarin use due to messiness of cream but it did help. Would like to be able to have intercourse but wants something other than cream. Discussed Estring and tablets. She wants to check with insurance  first. Recommend coconut oil for lubrication.   Dyspareunia, female - related to menopausal vaginal dryness. Considering restarting vaginal estrogen and  will use coconut oil for lubrication.   Screening for cervical cancer - Normal Pap history.  No longer screening per guidelines.   Screening for breast cancer - Normal mammogram history.  Continue annual screenings.  Normal breast exam today.  Screening for colon cancer - 2018 colonoscopy. Will repeat at GI's recommended interval.   Return in 1 year for breast and pelvic exam.      Tamela Gammon DNP, 12:24 PM 05/26/2022

## 2022-05-28 ENCOUNTER — Other Ambulatory Visit: Payer: Self-pay | Admitting: Nurse Practitioner

## 2022-05-28 DIAGNOSIS — Z1231 Encounter for screening mammogram for malignant neoplasm of breast: Secondary | ICD-10-CM

## 2022-07-12 ENCOUNTER — Ambulatory Visit
Admission: RE | Admit: 2022-07-12 | Discharge: 2022-07-12 | Disposition: A | Discharge status: Home / Self Care | Payer: Medicare Other | Source: Ambulatory Visit | Attending: Nurse Practitioner | Admitting: Nurse Practitioner

## 2022-07-12 DIAGNOSIS — Z1231 Encounter for screening mammogram for malignant neoplasm of breast: Secondary | ICD-10-CM

## 2022-08-02 ENCOUNTER — Encounter: Payer: Self-pay | Admitting: Neurology

## 2022-08-02 ENCOUNTER — Ambulatory Visit (INDEPENDENT_AMBULATORY_CARE_PROVIDER_SITE_OTHER): Payer: Medicare Other | Admitting: Neurology

## 2022-08-02 VITALS — BP 157/90 | HR 87 | Ht 65.0 in | Wt 120.0 lb

## 2022-08-02 DIAGNOSIS — Z79899 Other long term (current) drug therapy: Secondary | ICD-10-CM

## 2022-08-02 DIAGNOSIS — G35 Multiple sclerosis: Secondary | ICD-10-CM | POA: Diagnosis not present

## 2022-08-02 DIAGNOSIS — R269 Unspecified abnormalities of gait and mobility: Secondary | ICD-10-CM | POA: Diagnosis not present

## 2022-08-02 NOTE — Progress Notes (Signed)
[facility]  PATIENT: Ana Russell DOB: 09-Sep-1955  REFERRING DOCTOR OR PCP:   Dr. Deland Pretty SOURCE: Patient, notes from Dr. Krista Blue, imaging and lab reports, MRI images personally reviewed  _________________________________   HISTORICAL  CHIEF COMPLAINT:  Chief Complaint  Patient presents with   Follow-up    RM 2, alone. MS been stable. Has a black eye from a trip.    HISTORY OF PRESENT ILLNESS:  Ana Russell  is a 66 yo woman who was diagnosed with multiple sclerosis in 1991 but did not start any disease modifying therapy and her weighted labs in 2009.  She is currently on Aubagio but would like to consider stopping disease modifying therapies.    UPDATE 08/02/2022 She is off Aubagio now since late 2022 and has no new neurologic symptom/exacerbation.     She walks two dogs and the one dog tripped her 4-5 days ago and she has a bruise around the left orbit.  She had no LOC.     She uses a walking stick for balance (carries it as much as using it).  She has osteoporosis so is concerned about falls.    She often walks 1.5 to 3 miles straight.   She walks her dogs  She denies dysesthesias but has mild foot numbness.   Legs are strong.    Vision is fine.   She has some bladder urgency but no incontinence.  She will have some constipation but notes this has been a lifelong issue.  She denies significant fatigue and remains active exercising most days.  She gardens periodically.  She does note some sleep maintenance insomnia.  Sometimes she will wake up for couple hours at night.  Sleep onset is fine.  She denies any difficulties with mood or cognition in general.  Though she does have occasional reduced short-term memory.  MS History: She developed diplopia in 60 at age 72.   She had an abnormal MRI and was told she might have MS but due to no DMT at that time, she ws not placed on a DMT.   She did try bee sting and changes in diet.   She moved to [facility] and did  well a while.  She went to the [facility] clinic in the late 1990s when she had mild gait issues and was told there was no MS in her spinal cord.    She had a large exacerbation in 2009.   She had difficulty with speech and writing.   Dr. Krista Blue started to see her.    She was placed on Rebif and had done well x 10 years but had a lot of issues with skin reactions   She opted to switch to an oral medication.   She has been on Aubagio x 18 months and has had hair loss including eye brows.     Imaging reviewed: MRi of the brain in 2017 was unchanged from 2009 and the MRI 07/16/2021 was unchanged from 2017.   MRI of the cervical and thoracic spine.   All of the MRIs show multiple T2/FLAIR hyperintense foci, many in the periventricular white matter.  None of the foci appear to be acute.  MRI of the cervical and thoracic spine from 2009 was also reviewed.  The spinal cord is normal.  REVIEW OF SYSTEMS: Constitutional: No fevers, chills, sweats, or change in appetite Eyes: No visual changes, double vision, eye pain Ear, nose and throat: No hearing loss, ear pain, nasal congestion, sore  throat Cardiovascular: No chest pain, palpitations Respiratory:  No shortness of breath at rest or with exertion.   No wheezes GastrointestinaI: No nausea, vomiting, diarrhea, abdominal pain, fecal incontinence Genitourinary:  No dysuria, urinary retention or frequency.  No nocturia. Musculoskeletal:  No neck pain, back pain Integumentary: No rash, pruritus, skin lesions Neurological: as above Psychiatric: No depression at this time.  No anxiety Endocrine: No palpitations, diaphoresis, change in appetite, change in weigh or increased thirst Hematologic/Lymphatic:  No anemia, purpura, petechiae. Allergic/Immunologic: No itchy/runny eyes, nasal congestion, recent allergic reactions, rashes  ALLERGIES: No Known Allergies  HOME MEDICATIONS:  Current Outpatient Medications:    Ascorbic Acid (VITAMIN C PO), Take by mouth.,  Disp: , Rfl:    Biotin 1 MG CAPS, Take by mouth., Disp: , Rfl:    Cholecalciferol (VITAMIN D PO), Take by mouth., Disp: , Rfl:    SYNTHROID 100 MCG tablet, 100 mg daily., Disp: , Rfl: 0  PAST MEDICAL HISTORY: Past Medical History:  Diagnosis Date   C. difficile colitis 2013   Endometrial hyperplasia without atypia, simple 09/2003   NORMAL ENDOMETRIAL BIOPSY 03/2005   Fluid retention    in ears   Hypothyroidism    Meniere disease    MS (multiple sclerosis) ([place]) 02/25/2011   Osteoporosis 01/2018   T score -2.7    PAST SURGICAL HISTORY: Past Surgical History:  Procedure Laterality Date   DILATION AND CURETTAGE OF UTERUS     TAB   HYSTEROSCOPY  09/2003   POLYPS   KNEE SURGERY  2011   bilateral knee scopes   PELVIC LAPAROSCOPY     THYROIDECTOMY, PARTIAL      FAMILY HISTORY: Family History  Problem Relation Age of Onset   Diabetes Mother    Cancer Sister        Thyroid   Heart attack Sister    Breast cancer Maternal Aunt    Diabetes Paternal Grandfather    Multiple sclerosis Neg Hx     SOCIAL HISTORY:  Social History   Socioeconomic History   Marital status: Married    Spouse name: Ana Russell   Number of children: 0   Years of education: Bachelors   Highest education level: Not on file  Occupational History   Not on file  Tobacco Use   Smoking status: Former   Smokeless tobacco: Never  Vaping Use   Vaping Use: Never used  Substance and Sexual Activity   Alcohol use: Yes    Alcohol/week: 6.0 standard drinks of alcohol    Types: 6 Glasses of wine per week    Comment: TUES THURS SAT SUN WINE   Drug use: No   Sexual activity: Not Currently    Birth control/protection: Post-menopausal    Comment: First IC >16, Partners-5, No STDs, No DES Exposure  Other Topics Concern   Not on file  Social History Narrative   Patient lives at home with husband (Ana Russell)   Patient is right handed   Education level Bachelors degree   Caffeine consumption is 3 cups daily   Social  Determinants of Radio broadcast assistant Strain: Not on file  Food Insecurity: Not on file  Transportation Needs: Not on file  Physical Activity: Not on file  Stress: Not on file  Social Connections: Not on file  Intimate Partner Violence: Not on file     PHYSICAL EXAM  Vitals:   08/02/22 1100  BP: (!) 157/90  Pulse: 87  Weight: 120 lb (54.4 kg)  Height: 5'  5" (1.651 m)    Body mass index is 19.97 kg/m.   General: The patient is well-developed and well-nourished and in no acute distress  HEENT:  Head shows bruising below and above the right eye from her recent fall.  Sclera are anicteric.  Skin: Extremities are without rash or  edema.  Neurologic Exam  Mental status: The patient is alert and oriented x 3 at the time of the examination. The patient has apparent normal recent and remote memory, with an apparently normal attention span and concentration ability.   Speech is normal.  Cranial nerves: Extraocular movements are full.  Facial strength and sensation was normal.  Vision was symmetric.Marland Kitchen No obvious hearing deficits are noted.  Motor:  Muscle bulk is normal.   Tone is normal. Strength is  5 / 5 in all 4 extremities.   Sensory: Sensory testing is intact to pinprick, soft touch and vibration sensation in all 4 extremities.  Coordination: Cerebellar testing reveals good finger-nose-finger and heel-to-shin bilaterally.  Gait and station: Station is normal.   Her gait has a good stride.  Her turn is minimally off balance.  The tandem gait is mildly wide.  Romberg is negative.   Reflexes: Deep tendon reflexes are symmetric and normal bilaterally.      DIAGNOSTIC DATA (LABS, IMAGING, TESTING) - I reviewed patient records, labs, notes, testing and imaging myself where available.  Lab Results  Component Value Date   WBC 3.6 12/04/2020   HGB 13.9 12/04/2020   HCT 43.6 12/04/2020   MCV 94 12/04/2020   PLT 171 12/04/2020      Component Value Date/Time   NA  144 12/04/2020 1109   K 4.4 12/04/2020 1109   CL 104 12/04/2020 1109   CO2 22 12/04/2020 1109   GLUCOSE 98 12/04/2020 1109   GLUCOSE 95 04/12/2019 0857   BUN 15 12/04/2020 1109   CREATININE 0.80 12/04/2020 1109   CREATININE 0.80 04/12/2019 0857   CALCIUM 9.4 12/04/2020 1109   PROT 6.7 12/04/2020 1109   ALBUMIN 4.6 12/04/2020 1109   AST 28 12/04/2020 1109   ALT 18 12/04/2020 1109   ALKPHOS 47 12/04/2020 1109   BILITOT 0.4 12/04/2020 1109   GFRNONAA 64 02/27/2020 0834   GFRAA 74 02/27/2020 0834   Lab Results  Component Value Date   CHOL 207 (H) 04/12/2019   HDL 75 04/12/2019   LDLCALC 115 (H) 04/12/2019   LDLDIRECT 121.7 02/25/2011   TRIG 80 04/12/2019   CHOLHDL 2.8 04/12/2019   Lab Results  Component Value Date   HGBA1C 6.1 (H) 02/27/2020   Lab Results  Component Value Date   ASTMHDQQ22 979 02/27/2020   Lab Results  Component Value Date   TSH 4.310 02/27/2020       ASSESSMENT AND PLAN  MS (multiple sclerosis) (HCC)  Gait difficulty  High risk medication use  She will continue or of any disease modifying therapies.  In 2024 we will check an MRI of the brain to determine if there has been any subclinical progression.  If this is occurring we would need to reconsider reinitiating a disease modifying therapy.  Would also consider that if exacerbation occurs.  I did look at several MRIs.  She brought in her initial MRI from 37 and there is definite progression between 1991 and 2009.  However, there has been no definite progression in the last decade  stay active and exercise as tolerated. Return in 1 year or sooner if there are new or worsening neurologic  symptoms.    Rasheka Denard A. Felecia Shelling, MD, [facility] 58/94/8347, 58:30 PM Certified in Neurology, Clinical Neurophysiology, Sleep Medicine and Neuroimaging  [facility] [address] [place], [place] [address] [phone]

## 2023-03-08 ENCOUNTER — Encounter: Payer: Self-pay | Admitting: Neurology

## 2023-03-14 ENCOUNTER — Telehealth: Payer: Self-pay | Admitting: *Deleted

## 2023-03-14 NOTE — Telephone Encounter (Signed)
Brought in handicap placard for be signed.

## 2023-04-05 ENCOUNTER — Other Ambulatory Visit: Payer: Self-pay | Admitting: Internal Medicine

## 2023-04-05 DIAGNOSIS — R918 Other nonspecific abnormal finding of lung field: Secondary | ICD-10-CM

## 2023-04-18 ENCOUNTER — Ambulatory Visit
Admission: RE | Admit: 2023-04-18 | Discharge: 2023-04-18 | Disposition: A | Discharge status: Home / Self Care | Payer: Medicare Other | Source: Ambulatory Visit | Attending: Internal Medicine | Admitting: Internal Medicine

## 2023-04-18 DIAGNOSIS — R918 Other nonspecific abnormal finding of lung field: Secondary | ICD-10-CM

## 2023-06-06 ENCOUNTER — Other Ambulatory Visit: Payer: Self-pay | Admitting: Nurse Practitioner

## 2023-06-06 DIAGNOSIS — Z1231 Encounter for screening mammogram for malignant neoplasm of breast: Secondary | ICD-10-CM

## 2023-06-14 ENCOUNTER — Ambulatory Visit (INDEPENDENT_AMBULATORY_CARE_PROVIDER_SITE_OTHER): Payer: Medicare Other | Admitting: Nurse Practitioner

## 2023-06-14 ENCOUNTER — Encounter: Payer: Self-pay | Admitting: Nurse Practitioner

## 2023-06-14 VITALS — BP 118/76 | HR 80 | Ht 64.25 in | Wt 116.8 lb

## 2023-06-14 DIAGNOSIS — Z01419 Encounter for gynecological examination (general) (routine) without abnormal findings: Secondary | ICD-10-CM

## 2023-06-14 DIAGNOSIS — Z9189 Other specified personal risk factors, not elsewhere classified: Secondary | ICD-10-CM | POA: Diagnosis not present

## 2023-06-14 DIAGNOSIS — Z780 Asymptomatic menopausal state: Secondary | ICD-10-CM

## 2023-06-14 DIAGNOSIS — M810 Age-related osteoporosis without current pathological fracture: Secondary | ICD-10-CM

## 2023-06-14 DIAGNOSIS — Z1211 Encounter for screening for malignant neoplasm of colon: Secondary | ICD-10-CM

## 2023-06-14 NOTE — Progress Notes (Signed)
Pinkey Mcjunkin Russell 1956/05/24 [id number]   67 y.o. G2P0020 presents for breast and pelvic exam. Postmenopausal - no HRT, no bleeding. Normal pap and mammogram history. 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 She declines future bone density screenings. History of MS, hypothyroidism.   Gynecologic History No LMP recorded. Patient is postmenopausal.   Contraception: post menopausal status Sexually active: No  Health Maintenance Last Pap: 04/12/2019. Results were: Normal neg HPV, 5-year repeat Last mammogram: 07/12/2022. Results were: Normal Last colonoscopy: 2018 Last Dexa: 07/15/2020. Results were: T-score -2.7  Past medical history, past surgical history, family history and social history were all reviewed and documented in the EPIC chart. 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   ROS:  A ROS was performed and pertinent positives and negatives are included.  Exam:  Vitals:   06/14/23 1037  BP: 118/76  Pulse: 80  Weight: 116 lb 12.8 oz (53 kg)  Height: 5' 4.25" (1.632 m)     Body mass index is 19.89 kg/m.  General appearance:  Normal Thyroid:  Symmetrical, normal in size, without palpable masses or nodularity. Respiratory  Auscultation:  Clear without wheezing or rhonchi Cardiovascular  Auscultation:  Regular rate, without rubs, murmurs or gallops  Edema/varicosities:  Not grossly evident Abdominal  Soft,nontender, without masses, guarding or rebound.  Liver/spleen:  No organomegaly noted  Hernia:  None appreciated  Skin  Inspection:  Grossly normal Breasts: Examined lying and sitting.   Right: Without masses, retractions, nipple discharge or axillary adenopathy.   Left: Without masses, retractions, nipple discharge or axillary adenopathy. Pelvic: External genitalia:  no lesions              Urethra:  normal appearing urethra  with no masses, tenderness or lesions              Bartholins and Skenes: normal                 Vagina: normal appearing vagina with normal color and discharge, no lesions              Cervix: no lesions Bimanual Exam:  Uterus:  no masses or tenderness              Adnexa: no mass, fullness, tenderness              Rectovaginal: Deferred              Anus:  normal, no lesions  Patient informed chaperone available to be present for breast and pelvic exam. Patient has requested no chaperone to be present. Patient has been advised what will be completed during breast and pelvic exam.   Assessment/Plan:  67 y.o. G2P0020 for breast and pelvic exam.   Encounter for breast and pelvic examination - Education provided on SBEs, importance of preventative screenings, current guidelines, high calcium diet, regular exercise, and multivitamin daily. Labs with PCP.   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 She declines future bone density screenings.   Postmenopausal - no HRT, no bleeding.   Screening for colon cancer - Plan: Cologuard. Poor prep in 2018 with colonoscopy but did not return for repeat due to not tolerating prep. Declines colonoscopy  but agreeable to Cologuard.   Screening for cervical cancer - Normal Pap history.  Will repeat at 5-year interval per guidelines.   Screening for breast cancer - Normal mammogram history.  Continue annual screenings.  Normal breast exam today.  Return in 1 year for breast and pelvic exam.      Olivia Mackie DNP, 11:37 AM 06/14/2023

## 2023-06-30 LAB — COLOGUARD: COLOGUARD: NEGATIVE

## 2023-07-14 ENCOUNTER — Ambulatory Visit: Payer: Medicare Other

## 2023-07-14 ENCOUNTER — Ambulatory Visit
Admission: RE | Admit: 2023-07-14 | Discharge: 2023-07-14 | Disposition: A | Discharge status: Home / Self Care | Payer: Medicare Other | Source: Ambulatory Visit | Attending: Nurse Practitioner | Admitting: Nurse Practitioner

## 2023-07-14 DIAGNOSIS — Z1231 Encounter for screening mammogram for malignant neoplasm of breast: Secondary | ICD-10-CM

## 2023-08-03 ENCOUNTER — Encounter: Payer: Self-pay | Admitting: Neurology

## 2023-08-03 ENCOUNTER — Ambulatory Visit (INDEPENDENT_AMBULATORY_CARE_PROVIDER_SITE_OTHER): Payer: Medicare Other | Admitting: Neurology

## 2023-08-03 VITALS — BP 141/80 | HR 76 | Ht 64.0 in | Wt 118.5 lb

## 2023-08-03 DIAGNOSIS — Z79899 Other long term (current) drug therapy: Secondary | ICD-10-CM

## 2023-08-03 DIAGNOSIS — G35 Multiple sclerosis: Secondary | ICD-10-CM | POA: Diagnosis not present

## 2023-08-03 DIAGNOSIS — R269 Unspecified abnormalities of gait and mobility: Secondary | ICD-10-CM

## 2023-08-03 NOTE — Progress Notes (Signed)
[facility]  PATIENT: Ana Russell DOB: 10/14/1955  REFERRING DOCTOR OR PCP:   Dr. Merri Brunette SOURCE: Patient, notes from Dr. Terrace Arabia, imaging and lab reports, MRI images personally reviewed  _________________________________   HISTORICAL  CHIEF COMPLAINT:  Chief Complaint  Patient presents with   Room 10    Pt is here Alone. Pt states that things have been fine since her last appointment. Pt states that she doesn't have any new questions or concerns to discuss today.     HISTORY OF PRESENT ILLNESS:  Ana Russell  is a 67 yo woman who was diagnosed with multiple sclerosis in 1991 but did not start any disease modifying therapy and her weighted labs in 2009.     UPDATE 08/03/2023 She is off Aubagio now since late 2022 and has no new neurologic symptom/exacerbation.     She uses a cane or walking stick for balance.  She has osteoporosis so is concerned about falls.   She often walks 1.5 to 3 miles a couple times a week.    She has had a couple falls when the dog trips her.   She walks two dogs at the same time.     She denies dysesthesias but has mild foot numbness.   Legs are strong.      Vision is fine.   She wears glasses.  No diplopia  She has some bladder urgency but no incontinence.  She will have some constipation but notes this has been a lifelong issue.  She denies significant fatigue and remains active exercising most days.  She gardens periodically and does walks.  She does note some sleep maintenance insomnia.  Sometimes she will wake up for couple hours at night and just get up.   She denies any difficulties with mood or cognition in general.  She is occasionally irritable.  She notes occasional reduced short-term memory.  MS History: She developed diplopia in 30 at age 67.   She had an abnormal MRI and was told she might have MS but due to no DMT at that time, she ws not placed on a DMT.   She did try bee sting and changes in diet.   She  moved to [facility] and did well a while.  She went to the [facility] clinic in the late 1990s when she had mild gait issues and was told there was no MS in her spinal cord.    She had a large exacerbation in 2009.   She had difficulty with speech and writing.   Dr. Terrace Arabia started to see her.    She was placed on Rebif and had done well x 10 years but had a lot of issues with skin reactions   She opted to switch to an oral medication.   She has been on Aubagio x 18 months and has had hair loss including eye brows.     Imaging reviewed: MRi of the brain in 2017 was unchanged from 2009 and the MRI 07/16/2021 was unchanged from 2017.   MRI of the cervical and thoracic spine.   All of the MRIs show multiple T2/FLAIR hyperintense foci, many in the periventricular white matter.  None of the foci appear to be acute.  MRI 2009 compared to 1991 showed some progression of the MS during those 18 years  MRI of the cervical and thoracic spine from 2009 was also reviewed.  The spinal cord is normal.  REVIEW OF SYSTEMS: Constitutional: No fevers, chills, sweats, or  change in appetite Eyes: No visual changes, double vision, eye pain Ear, nose and throat: No hearing loss, ear pain, nasal congestion, sore throat Cardiovascular: No chest pain, palpitations Respiratory:  No shortness of breath at rest or with exertion.   No wheezes GastrointestinaI: No nausea, vomiting, diarrhea, abdominal pain, fecal incontinence Genitourinary:  No dysuria, urinary retention or frequency.  No nocturia. Musculoskeletal:  No neck pain, back pain Integumentary: No rash, pruritus, skin lesions Neurological: as above Psychiatric: No depression at this time.  No anxiety Endocrine: No palpitations, diaphoresis, change in appetite, change in weigh or increased thirst Hematologic/Lymphatic:  No anemia, purpura, petechiae. Allergic/Immunologic: No itchy/runny eyes, nasal congestion, recent allergic reactions, rashes  ALLERGIES: No Known  Allergies  HOME MEDICATIONS:  Current Outpatient Medications:    Ascorbic Acid (VITAMIN C PO), Take by mouth., Disp: , Rfl:    Biotin 1 MG CAPS, Take by mouth., Disp: , Rfl:    Cholecalciferol (VITAMIN D PO), Take by mouth., Disp: , Rfl:    SYNTHROID 100 MCG tablet, 100 mg daily., Disp: , Rfl: 0  PAST MEDICAL HISTORY: Past Medical History:  Diagnosis Date   C. difficile colitis 2013   Endometrial hyperplasia without atypia, simple 09/2003   NORMAL ENDOMETRIAL BIOPSY 03/2005   Fluid retention    in ears   Hypothyroidism    Meniere disease    MS (multiple sclerosis) (HCC) 02/25/2011   Osteoporosis 01/2018   T score -2.7    PAST SURGICAL HISTORY: Past Surgical History:  Procedure Laterality Date   DILATION AND CURETTAGE OF UTERUS     TAB   HYSTEROSCOPY  09/2003   POLYPS   KNEE SURGERY  2011   bilateral knee scopes   PELVIC LAPAROSCOPY     THYROIDECTOMY, PARTIAL      FAMILY HISTORY: Family History  Problem Relation Age of Onset   Diabetes Mother    Cancer Sister        Thyroid   Heart attack Sister    Breast cancer Maternal Aunt    Diabetes Paternal Grandfather    Multiple sclerosis Neg Hx     SOCIAL HISTORY:  Social History   Socioeconomic History   Marital status: Married    Spouse name: Ed   Number of children: 0   Years of education: Bachelors   Highest education level: Not on file  Occupational History   Not on file  Tobacco Use   Smoking status: Former   Smokeless tobacco: Never  Vaping Use   Vaping status: Never Used  Substance and Sexual Activity   Alcohol use: Yes    Alcohol/week: 6.0 standard drinks of alcohol    Types: 6 Glasses of wine per week    Comment: TUES THURS SAT SUN WINE   Drug use: No   Sexual activity: Not Currently    Birth control/protection: Post-menopausal    Comment: First IC >16, Partners-5, No STDs, No DES Exposure  Other Topics Concern   Not on file  Social History Narrative   Patient lives at home with husband  (Ed)   Patient is right handed   Education level Bachelors degree   Caffeine consumption is 3 cups daily   Social Determinants of Health   Financial Resource Strain: Not on file  Food Insecurity: Not on file  Transportation Needs: Not on file  Physical Activity: Not on file  Stress: Not on file  Social Connections: Unknown (01/15/2022)   Received from [facility], [facility]   Social Network  Social Network: Not on file  Intimate Partner Violence: Unknown (12/07/2021)   Received from [facility]   HITS    Physically Hurt: Not on file    Insult or Talk Down To: Not on file    Threaten Physical Harm: Not on file    Scream or Curse: Not on file     PHYSICAL EXAM  Vitals:   08/03/23 1057  BP: (!) 141/80  Pulse: 76  Weight: 118 lb 8 oz (53.8 kg)  Height: 5\' 4"  (1.626 m)    Body mass index is 20.34 kg/m.   General: The patient is well-developed and well-nourished and in no acute distress  HEENT:  Head shows bruising below and above the right eye from her recent fall.  Sclera are anicteric.  Skin: Extremities are without rash or  edema.  Neurologic Exam  Mental status: The patient is alert and oriented x 3 at the time of the examination. The patient has apparent normal recent and remote memory, with an apparently normal attention span and concentration ability.   Speech is normal.  Cranial nerves: Extraocular movements are full.  Facial strength and sensation was normal.  Vision was symmetric.Marland Kitchen No obvious hearing deficits are noted.  Motor:  Muscle bulk is normal.   Tone is normal. Strength is  5 / 5 in all 4 extremities.   Sensory: Sensory testing is intact to pinprick, soft touch and vibration sensation in all 4 extremities.  Coordination: Cerebellar testing reveals good finger-nose-finger and heel-to-shin bilaterally.  Gait and station: Station is normal.  She has a normal length stride and can turn in 3 steps.  The tandem gait is mildly  wide.  Romberg is negative.   Reflexes: Deep tendon reflexes are symmetric and normal bilaterally.      DIAGNOSTIC DATA (LABS, IMAGING, TESTING) - I reviewed patient records, labs, notes, testing and imaging myself where available.  Lab Results  Component Value Date   WBC 3.6 12/04/2020   HGB 13.9 12/04/2020   HCT 43.6 12/04/2020   MCV 94 12/04/2020   PLT 171 12/04/2020      Component Value Date/Time   NA 144 12/04/2020 1109   K 4.4 12/04/2020 1109   CL 104 12/04/2020 1109   CO2 22 12/04/2020 1109   GLUCOSE 98 12/04/2020 1109   GLUCOSE 95 04/12/2019 0857   BUN 15 12/04/2020 1109   CREATININE 0.80 12/04/2020 1109   CREATININE 0.80 04/12/2019 0857   CALCIUM 9.4 12/04/2020 1109   PROT 6.7 12/04/2020 1109   ALBUMIN 4.6 12/04/2020 1109   AST 28 12/04/2020 1109   ALT 18 12/04/2020 1109   ALKPHOS 47 12/04/2020 1109   BILITOT 0.4 12/04/2020 1109   GFRNONAA 64 02/27/2020 0834   GFRAA 74 02/27/2020 0834   Lab Results  Component Value Date   CHOL 207 (H) 04/12/2019   HDL 75 04/12/2019   LDLCALC 115 (H) 04/12/2019   LDLDIRECT 121.7 02/25/2011   TRIG 80 04/12/2019   CHOLHDL 2.8 04/12/2019   Lab Results  Component Value Date   HGBA1C 6.1 (H) 02/27/2020   Lab Results  Component Value Date   VITAMINB12 345 02/27/2020   Lab Results  Component Value Date   TSH 4.310 02/27/2020       ASSESSMENT AND PLAN  MS (multiple sclerosis) (HCC)  Gait difficulty  High risk medication use  She will continue off a disease modifying therapies.  Prefers to hold off on an MRi this year.  At next visit,  we will check an MRI of the brain to determine if there has been any subclinical progression.  If this is occurring we would need to reconsider reinitiating a disease modifying therapy.  Would also consider that if exacerbation occurs.  I did look at several MRIs.MRI shows definite progression between 1991 and 2009.  However, there has been no definite progression in the last decade   stay active and exercise as tolerated. Return in 1 year or sooner if there are new or worsening neurologic symptoms.    Saajan Willmon A. Epimenio Foot, MD, [facility] 08/03/2023, 11:27 AM Certified in Neurology, Clinical Neurophysiology, Sleep Medicine and Neuroimaging  [facility] [address] [place], [place] [address] [phone]

## 2023-09-16 ENCOUNTER — Telehealth: Payer: Self-pay

## 2023-09-16 ENCOUNTER — Ambulatory Visit (INDEPENDENT_AMBULATORY_CARE_PROVIDER_SITE_OTHER): Payer: Medicare Other | Admitting: Radiology

## 2023-09-16 VITALS — BP 126/82 | HR 105 | Temp 98.7°F

## 2023-09-16 DIAGNOSIS — R300 Dysuria: Secondary | ICD-10-CM

## 2023-09-16 NOTE — Telephone Encounter (Signed)
 Can she come in now.

## 2023-09-16 NOTE — Progress Notes (Signed)
      SUBJECTIVE: Ana Russell is a 68 y.o. female who complains of dysuria, pelvic pressure with full bladder, urgency, frequency.     No Known Allergies  Current Outpatient Medications on File Prior to Visit  Medication Sig Dispense Refill   Ascorbic Acid (VITAMIN C PO) Take by mouth.     Biotin 1 MG CAPS Take by mouth.     Cholecalciferol  (VITAMIN D  PO) Take by mouth.     SYNTHROID  100 MCG tablet 100 mg daily.  0   No current facility-administered medications on file prior to visit.    Past Medical History:  Diagnosis Date   C. difficile colitis 2013   Endometrial hyperplasia without atypia, simple 09/2003   NORMAL ENDOMETRIAL BIOPSY 03/2005   Fluid retention    in ears   Hypothyroidism    Meniere disease    MS (multiple sclerosis) (HCC) 02/25/2011   Osteoporosis 01/2018   T score -2.7     OBJECTIVE: Appears well, in no apparent distress.  Vital signs are normal. The abdomen is soft without tenderness, guarding, mass, rebound or organomegaly. No CVA tenderness or inguinal adenopathy noted. Urine dipstick shows positive for RBC's.  Micro exam: 3-10 RBC's per HPF.    Blood pressure 126/82, pulse (!) 105, temperature 98.7 F (37.1 C), temperature source Oral, SpO2 99%.    Chaperone offered and declined for exam.  ASSESSMENT/PLAN: 1. Dysuria (Primary) - Urinalysis,Complete w/RFL Culture    Will send urine culture and sensitivity.  Push fluids, avoid bladder irritants. Instructed she may use Pyridium OTC prn. Call or return to clinic prn if these symptoms worsen or fail to improve as anticipated. Pyelo precautions reviewed with patient.   Jujuan Dugo B, NP 12:54 PM

## 2023-09-16 NOTE — Telephone Encounter (Signed)
 Pt LVM in triage line stating that she suspects that she is having a UTI. Stated that she had a UTI txd by PCP in 06/2023 but feels as if she either never cleared it or it may have returned. Pt requesting that she can bring in a sample collected from home since she has a shy bladder. Reports that she keeps her own containers at home for this reason. States this information should be documented in her hx around the time that she saw Dr. Rockney.   Spoke w/ the pt and she c/o mild discomfort in pelvic region while laying down.   Pt reports she does realized that her sxs could either be related to UTI or not but stated when she was checked at her PCP in 06/2023, she wasn't having any sxs and the test was positive.   Please advise.

## 2023-09-16 NOTE — Telephone Encounter (Signed)
 Pt reported she will collect her sample now and head over. Will have appt desk put her on the schedule.

## 2023-09-18 LAB — URINALYSIS, COMPLETE W/RFL CULTURE
Bacteria, UA: NONE SEEN /HPF
Bilirubin Urine: NEGATIVE
Glucose, UA: NEGATIVE
Hyaline Cast: NONE SEEN /[LPF]
Ketones, ur: NEGATIVE
Leukocyte Esterase: NEGATIVE
Nitrites, Initial: NEGATIVE
Protein, ur: NEGATIVE
Specific Gravity, Urine: 1.025 (ref 1.001–1.035)
pH: 5.5 (ref 5.0–8.0)

## 2023-09-18 LAB — URINE CULTURE
MICRO NUMBER:: 15943286
Result:: NO GROWTH
SPECIMEN QUALITY:: ADEQUATE

## 2023-09-18 LAB — CULTURE INDICATED

## 2023-09-20 ENCOUNTER — Telehealth: Payer: Self-pay | Admitting: *Deleted

## 2023-09-20 NOTE — Telephone Encounter (Signed)
 Call returned to patient, left detailed message, ok per dpr. Advised per Elmarie Shiley, return call to office if you desire to schedule UA, [phone].   Encounter closed.

## 2023-09-20 NOTE — Telephone Encounter (Signed)
 Patient notified. Encounter closed

## 2023-09-20 NOTE — Telephone Encounter (Signed)
 I do not have access to labs from PCP to review, but as long as her kidney function and blood levels are normal I am not concerned with the bilirubin in her urine. If she wants to return for a UA in 4-6 weeks to recheck she can.

## 2023-09-20 NOTE — Telephone Encounter (Signed)
 Patient left message requesting copy of 09/16/23 labs be faxed to PCP, Dr. Andree at [facility].    Labs faxed via EPIC.   Call returned to patient.  Patient notified of results of 09/16/23 urine culture. Symptoms have resolved. Patient asking if anything additional needed for f/u of hemoglobin in urine. Patient wants in ensure everything is ruled out and resolved. Would like Tiffany included on message.   Routing to [place] to review.     Cc: Tiffany

## 2023-09-20 NOTE — Telephone Encounter (Signed)
 Negative urine culture, no further testing recommended by me at this time.

## 2024-01-11 ENCOUNTER — Telehealth: Payer: Self-pay | Admitting: Neurology

## 2024-01-11 DIAGNOSIS — R269 Unspecified abnormalities of gait and mobility: Secondary | ICD-10-CM

## 2024-01-11 DIAGNOSIS — G35 Multiple sclerosis: Secondary | ICD-10-CM

## 2024-01-11 NOTE — Telephone Encounter (Signed)
 Spoke to patient states think she is having an MS flare up  Pt states gait is off,dragging both feet,and some double vision Pt denies any infections or falls Pt states under increased stress her dog is sick and passing away . Pt states haven't been on MS medications in 2 years (aubagio ) Pt is requesting and MRI haven't  had one since 2022. Per Dr Godwin Lat please order MRI Brain and CT cervical spine w/wo contrast .  Pt aware of test ordered . Did inform patient if symptoms worsen please go to ED or urgent care to get evaluated Pt expressed understanding and thanked me for calling

## 2024-01-11 NOTE — Telephone Encounter (Signed)
 Pt called stating that she has been having difficulty with a possible flare up she has been experiencing and is wanting to have to nurse call her to discuss because she states that she can't seem to pull herself out of it. Please advise.

## 2024-01-12 ENCOUNTER — Other Ambulatory Visit: Payer: Self-pay | Admitting: *Deleted

## 2024-01-12 DIAGNOSIS — G35 Multiple sclerosis: Secondary | ICD-10-CM

## 2024-01-25 ENCOUNTER — Encounter: Payer: Self-pay | Admitting: Neurology

## 2024-02-23 ENCOUNTER — Ambulatory Visit
Admission: RE | Admit: 2024-02-23 | Discharge: 2024-02-23 | Disposition: A | Discharge status: Home / Self Care | Source: Ambulatory Visit | Attending: Neurology | Admitting: Neurology

## 2024-02-23 ENCOUNTER — Ambulatory Visit: Payer: Self-pay | Admitting: Neurology

## 2024-02-23 DIAGNOSIS — G35 Multiple sclerosis: Secondary | ICD-10-CM | POA: Diagnosis not present

## 2024-02-23 MED ORDER — GADOPICLENOL 0.5 MMOL/ML IV SOLN
5.5000 mL | Freq: Once | INTRAVENOUS | Status: AC | PRN
  Administered 2024-02-23: 5.5 mL via INTRAVENOUS

## 2024-03-30 ENCOUNTER — Encounter: Payer: Self-pay | Admitting: Neurology

## 2024-04-03 ENCOUNTER — Ambulatory Visit (INDEPENDENT_AMBULATORY_CARE_PROVIDER_SITE_OTHER): Admitting: Neurology

## 2024-04-03 ENCOUNTER — Encounter: Payer: Self-pay | Admitting: Neurology

## 2024-04-03 VITALS — BP 128/71 | HR 83 | Ht 64.0 in | Wt 123.0 lb

## 2024-04-03 DIAGNOSIS — G35 Multiple sclerosis: Secondary | ICD-10-CM | POA: Diagnosis not present

## 2024-04-03 DIAGNOSIS — R269 Unspecified abnormalities of gait and mobility: Secondary | ICD-10-CM

## 2024-04-03 DIAGNOSIS — Z79899 Other long term (current) drug therapy: Secondary | ICD-10-CM | POA: Diagnosis not present

## 2024-04-03 NOTE — Progress Notes (Signed)
 [facility]  PATIENT: Ana Russell DOB: 09-25-1955  REFERRING DOCTOR OR PCP:   Dr. Ryan Hives SOURCE: Patient, notes from Dr. Onita, imaging and lab reports, MRI images personally reviewed  _________________________________   HISTORICAL  CHIEF COMPLAINT:  Chief Complaint  Patient presents with   Follow-up    Pt in room 11. Here for worsening MS symptoms.  Pt states numbness on nose/lip numbness/tingling in feet ( which is not new) pt likes gardening spends time outside.     HISTORY OF PRESENT ILLNESS:  Ana Russell  is a 68 yo woman who was diagnosed with multiple sclerosis in 1991 but did not start any disease modifying therapy and her weighted labs in 2009.     UPDATE 04/03/2024 She discontinued Aubagio  in late 2022 and has no definite exacerbation.  However, she notes more left facial numbness.  She noted the onset of the symptoms occurred while outside doing some gardening a week or 2 ago while the temperature was elevated.  She still feels some numbness.  She was checked for a sinus infection and was told it was fine   MRI 02/23/2024 showed no new lesions.  There is just mild mucoperiosteal thickening in the maxillary sinuses at that time  She feels gait is stable - it was a worse last month but then improved.She uses a cane or walking stick for balance.  Despite her gait issues, she continues to walk for exercise and stays very active.  No recent fall but she has had several over the last couple years.  She denies dysesthesias but has mild foot numbness.   Legs are strong.      Vision is ok but eyes feel different and she is going to see her the doctor.   She wears glasses.  No diplopia  She has some bladder urgency but no incontinence.  She will have some constipation but notes this has been a lifelong issue.  She denies significant fatigue and remains active exercising most days.  She gardens periodically and does walks.  She does note some sleep  maintenance insomnia.  Sometimes she will wake up for couple hours at night and just get up.   She denies any difficulties with mood or cognition in general.  She is occasionally irritable.  She notes occasional reduced short-term memory.  MS History: She developed diplopia in 69 at age 68.   She had an abnormal MRI and was told she might have MS but due to no DMT at that time, she ws not placed on a DMT.   She did try bee sting and changes in diet.   She moved to [facility] and did well a while.  She went to the [facility] clinic in the late 1990s when she had mild gait issues and was told there was no MS in her spinal cord.    She had a large exacerbation in 2009.   She had difficulty with speech and writing.   Dr. Onita started to see her.    She was placed on Rebif  and had done well x 10 years but had a lot of issues with skin reactions   She opted to switch to an oral medication.   She has been on Aubagio  x 18 months and has had hair loss including eye brows.     Imaging reviewed: MRI of the brain 02/23/2024 Multiple T2/FLAIR hyperintense foci in the cerebral hemispheres and a small focus in the left cerebellar hemisphere.  These are  in a pattern consistent with chronic demyelinating plaque associated with multiple sclerosis.  None of the foci enhance or appear to be acute.  Compared to the MRI from 05/26/2021, there were no new lesions.     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   Mucoperiosteal thickening in the maxillary sinuses that could be seen with mild chronic maxillary sinusitis  MRI cervical spine 02/23/2024 showed T2 hyperintense foci are noted within the spinal cord towards the left adjacent to C2, posteriorly adjacent to C3-C4: Anteriorly adjacent to C4, towards the left adjacent to C5, towards the left adjacent to C6, towards the right adjacent to T1. None of these foci enhance. The foci adjacent to C4 and C6 were not clearly seen on the MRI from 2009 while  other foci while present.    MRi of the brain in 2017 was unchanged from 2009 and the MRI 07/16/2021 was unchanged from 2017.   MRI of the cervical and thoracic spine.   All of the MRIs show multiple T2/FLAIR hyperintense foci, many in the periventricular white matter.  None of the foci appear to be acute.  MRI 2009 compared to 1991 showed some progression of the MS during those 18 years  MRI of the cervical and thoracic spine from 2009 was also reviewed.  The spinal cord is normal.  REVIEW OF SYSTEMS: Constitutional: No fevers, chills, sweats, or change in appetite Eyes: No visual changes, double vision, eye pain Ear, nose and throat: No hearing loss, ear pain, nasal congestion, sore throat Cardiovascular: No chest pain, palpitations Respiratory:  No shortness of breath at rest or with exertion.   No wheezes GastrointestinaI: No nausea, vomiting, diarrhea, abdominal pain, fecal incontinence Genitourinary:  No dysuria, urinary retention or frequency.  No nocturia. Musculoskeletal:  No neck pain, back pain Integumentary: No rash, pruritus, skin lesions Neurological: as above Psychiatric: No depression at this time.  No anxiety Endocrine: No palpitations, diaphoresis, change in appetite, change in weigh or increased thirst Hematologic/Lymphatic:  No anemia, purpura, petechiae. Allergic/Immunologic: No itchy/runny eyes, nasal congestion, recent allergic reactions, rashes  ALLERGIES: No Known Allergies  HOME MEDICATIONS:  Current Outpatient Medications:    alendronate  (FOSAMAX ) 70 MG tablet, Take 70 mg by mouth once a week. Takes on Friday, Disp: , Rfl:    Ascorbic Acid (VITAMIN C PO), Take by mouth., Disp: , Rfl:    Biotin 1 MG CAPS, Take by mouth., Disp: , Rfl:    Cholecalciferol (VITAMIN D  PO), Take by mouth., Disp: , Rfl:    rosuvastatin (CRESTOR) 5 MG tablet, Take 5 mg by mouth daily., Disp: , Rfl:    SYNTHROID 100 MCG tablet, 100 mg daily., Disp: , Rfl: 0  PAST MEDICAL  HISTORY: Past Medical History:  Diagnosis Date   C. difficile colitis 2013   Endometrial hyperplasia without atypia, simple 09/2003   NORMAL ENDOMETRIAL BIOPSY 03/2005   Fluid retention    in ears   Hypothyroidism    Meniere disease    MS (multiple sclerosis) (HCC) 02/25/2011   Osteoporosis 01/2018   T score -2.7    PAST SURGICAL HISTORY: Past Surgical History:  Procedure Laterality Date   DILATION AND CURETTAGE OF UTERUS     TAB   HYSTEROSCOPY  09/2003   POLYPS   KNEE SURGERY  2011   bilateral knee scopes   PELVIC LAPAROSCOPY     THYROIDECTOMY, PARTIAL      FAMILY HISTORY: Family History  Problem Relation Age of Onset  Diabetes Mother    Cancer Sister        Thyroid    Heart attack Sister    Breast cancer Maternal Aunt    Diabetes Paternal Grandfather    Multiple sclerosis Neg Hx     SOCIAL HISTORY:  Social History   Socioeconomic History   Marital status: Married    Spouse name: Ed   Number of children: 0   Years of education: Bachelors   Highest education level: Not on file  Occupational History   Not on file  Tobacco Use   Smoking status: Former   Smokeless tobacco: Never  Vaping Use   Vaping status: Never Used  Substance and Sexual Activity   Alcohol use: Yes    Alcohol/week: 6.0 standard drinks of alcohol    Types: 6 Glasses of wine per week    Comment: TUES THURS SAT SUN WINE   Drug use: No   Sexual activity: Not Currently    Birth control/protection: Post-menopausal    Comment: First IC >16, Partners-5, No STDs, No DES Exposure  Other Topics Concern   Not on file  Social History Narrative   Patient lives at home with husband (Ed)   Patient is right handed   Education level Bachelors degree   Caffeine consumption is 3 cups daily   Social Drivers of Corporate investment banker Strain: Not on file  Food Insecurity: Not on file  Transportation Needs: Not on file  Physical Activity: Not on file  Stress: Not on file  Social Connections:  Unknown (01/15/2022)   Received [facility]   Social Network    Social Network: Not on file  Intimate Partner Violence: Unknown (12/07/2021)   Received from [facility]   HITS    Physically Hurt: Not on file    Insult or Talk Down To: Not on file    Threaten Physical Harm: Not on file    Scream or Curse: Not on file     PHYSICAL EXAM  Vitals:   04/03/24 1321  BP: 128/71  Pulse: 83  Weight: 123 lb (55.8 kg)  Height: 5' 4 (1.626 m)    Body mass index is 21.11 kg/m.   General: The patient is well-developed and well-nourished and in no acute distress  HEENT:  Head shows bruising below and above the right eye from her recent fall.  Sclera are anicteric.  Skin: Extremities are without rash or  edema.  Neurologic Exam  Mental status: The patient is alert and oriented x 3 at the time of the examination. The patient has apparent normal recent and remote memory, with an apparently normal attention span and concentration ability.   Speech is normal.  Cranial nerves: Extraocular movements are full.  No ptosis.  Facial strength and sensation was normal.  Vision was symmetric.SABRA No obvious hearing deficits are noted.  Motor:  Muscle bulk is normal.   Tone is normal. Strength is  5 / 5 in all 4 extremities.   Sensory: Sensory testing is intact to pinprick, soft touch and vibration sensation in all 4 extremities.  Coordination: Cerebellar testing reveals good finger-nose-finger and heel-to-shin bilaterally.  Gait and station: Station is normal.  She has a normal length stride though the gait is mildly wide and she can turn in 3 steps.  The tandem gait is mildly wide.  Romberg is negative.   Reflexes: Deep tendon reflexes are symmetric and normal bilaterally.      DIAGNOSTIC DATA (LABS, IMAGING, TESTING) - I  reviewed patient records, labs, notes, testing and imaging myself where available.  Lab Results  Component Value Date   WBC 3.6 12/04/2020   HGB 13.9 12/04/2020    HCT 43.6 12/04/2020   MCV 94 12/04/2020   PLT 171 12/04/2020      Component Value Date/Time   NA 144 12/04/2020 1109   K 4.4 12/04/2020 1109   CL 104 12/04/2020 1109   CO2 22 12/04/2020 1109   GLUCOSE 98 12/04/2020 1109   GLUCOSE 95 04/12/2019 0857   BUN 15 12/04/2020 1109   CREATININE 0.80 12/04/2020 1109   CREATININE 0.80 04/12/2019 0857   CALCIUM  9.4 12/04/2020 1109   PROT 6.7 12/04/2020 1109   ALBUMIN 4.6 12/04/2020 1109   AST 28 12/04/2020 1109   ALT 18 12/04/2020 1109   ALKPHOS 47 12/04/2020 1109   BILITOT 0.4 12/04/2020 1109   GFRNONAA 64 02/27/2020 0834   GFRAA 74 02/27/2020 0834   Lab Results  Component Value Date   CHOL 207 (H) 04/12/2019   HDL 75 04/12/2019   LDLCALC 115 (H) 04/12/2019   LDLDIRECT 121.7 02/25/2011   TRIG 80 04/12/2019   CHOLHDL 2.8 04/12/2019   Lab Results  Component Value Date   HGBA1C 6.1 (H) 02/27/2020   Lab Results  Component Value Date   VITAMINB12 345 02/27/2020   Lab Results  Component Value Date   TSH 4.310 02/27/2020       ASSESSMENT AND PLAN  MS (multiple sclerosis) (HCC)  Gait difficulty  High risk medication use  The recent episode of facial numbness is unlikely to be an actual exacerbation.  Most likely she had increased symptoms related to being outdoors in the heat.  Gait and strength have been stable.SABRAMRIs have shown some progression over time but the last MRI did not show any progression since 2022.   Stay active and exercise as tolerated. Return in 1 year or sooner if there are new or worsening neurologic symptoms.  This visit is part of a comprehensive longitudinal care medical relationship regarding the patients primary diagnosis of MS and related concerns.   Caprina Wussow A. Vear, MD, [facility] 04/03/2024, 1:57 PM Certified in Neurology, Clinical Neurophysiology, Sleep Medicine and Neuroimaging  [facility] [address], [address] [place], [place] [address] [phone]

## 2024-04-13 ENCOUNTER — Encounter: Payer: Self-pay | Admitting: Neurology

## 2024-04-16 ENCOUNTER — Other Ambulatory Visit: Payer: Self-pay | Admitting: *Deleted

## 2024-04-16 ENCOUNTER — Telehealth: Payer: Self-pay | Admitting: Neurology

## 2024-04-16 DIAGNOSIS — H532 Diplopia: Secondary | ICD-10-CM

## 2024-04-16 NOTE — Telephone Encounter (Signed)
 Referral for neurology fax to [facility]. Phone: [phone], Fax: [phone]

## 2024-04-17 ENCOUNTER — Telehealth: Payer: Self-pay | Admitting: Neurology

## 2024-04-17 ENCOUNTER — Encounter (HOSPITAL_COMMUNITY): Payer: Self-pay

## 2024-04-17 ENCOUNTER — Emergency Department (HOSPITAL_COMMUNITY)

## 2024-04-17 ENCOUNTER — Encounter: Payer: Self-pay | Admitting: Neurology

## 2024-04-17 ENCOUNTER — Other Ambulatory Visit: Payer: Self-pay

## 2024-04-17 ENCOUNTER — Inpatient Hospital Stay (HOSPITAL_COMMUNITY)
Admission: EM | Admit: 2024-04-17 | Discharge: 2024-04-23 | DRG: 059 | Disposition: A | Discharge status: Home / Self Care | Attending: Internal Medicine | Admitting: Internal Medicine

## 2024-04-17 DIAGNOSIS — M810 Age-related osteoporosis without current pathological fracture: Secondary | ICD-10-CM | POA: Diagnosis present

## 2024-04-17 DIAGNOSIS — F28 Other psychotic disorder not due to a substance or known physiological condition: Secondary | ICD-10-CM | POA: Diagnosis not present

## 2024-04-17 DIAGNOSIS — M47812 Spondylosis without myelopathy or radiculopathy, cervical region: Secondary | ICD-10-CM | POA: Diagnosis present

## 2024-04-17 DIAGNOSIS — D509 Iron deficiency anemia, unspecified: Secondary | ICD-10-CM | POA: Diagnosis present

## 2024-04-17 DIAGNOSIS — E785 Hyperlipidemia, unspecified: Secondary | ICD-10-CM | POA: Diagnosis present

## 2024-04-17 DIAGNOSIS — Z79899 Other long term (current) drug therapy: Secondary | ICD-10-CM

## 2024-04-17 DIAGNOSIS — H5010 Unspecified exotropia: Secondary | ICD-10-CM | POA: Diagnosis present

## 2024-04-17 DIAGNOSIS — Z681 Body mass index (BMI) 19 or less, adult: Secondary | ICD-10-CM | POA: Diagnosis not present

## 2024-04-17 DIAGNOSIS — E039 Hypothyroidism, unspecified: Secondary | ICD-10-CM | POA: Diagnosis present

## 2024-04-17 DIAGNOSIS — D638 Anemia in other chronic diseases classified elsewhere: Secondary | ICD-10-CM | POA: Diagnosis present

## 2024-04-17 DIAGNOSIS — Z8619 Personal history of other infectious and parasitic diseases: Secondary | ICD-10-CM

## 2024-04-17 DIAGNOSIS — F411 Generalized anxiety disorder: Secondary | ICD-10-CM | POA: Diagnosis present

## 2024-04-17 DIAGNOSIS — G4700 Insomnia, unspecified: Secondary | ICD-10-CM | POA: Diagnosis present

## 2024-04-17 DIAGNOSIS — H539 Unspecified visual disturbance: Secondary | ICD-10-CM

## 2024-04-17 DIAGNOSIS — J45909 Unspecified asthma, uncomplicated: Secondary | ICD-10-CM | POA: Diagnosis present

## 2024-04-17 DIAGNOSIS — Z7983 Long term (current) use of bisphosphonates: Secondary | ICD-10-CM

## 2024-04-17 DIAGNOSIS — M4802 Spinal stenosis, cervical region: Secondary | ICD-10-CM | POA: Diagnosis present

## 2024-04-17 DIAGNOSIS — E440 Moderate protein-calorie malnutrition: Secondary | ICD-10-CM | POA: Diagnosis present

## 2024-04-17 DIAGNOSIS — Z79890 Hormone replacement therapy (postmenopausal): Secondary | ICD-10-CM | POA: Diagnosis not present

## 2024-04-17 DIAGNOSIS — Z8249 Family history of ischemic heart disease and other diseases of the circulatory system: Secondary | ICD-10-CM | POA: Diagnosis not present

## 2024-04-17 DIAGNOSIS — Z833 Family history of diabetes mellitus: Secondary | ICD-10-CM

## 2024-04-17 DIAGNOSIS — T380X5A Adverse effect of glucocorticoids and synthetic analogues, initial encounter: Secondary | ICD-10-CM | POA: Diagnosis not present

## 2024-04-17 DIAGNOSIS — Z87891 Personal history of nicotine dependence: Secondary | ICD-10-CM | POA: Diagnosis not present

## 2024-04-17 DIAGNOSIS — R270 Ataxia, unspecified: Secondary | ICD-10-CM | POA: Diagnosis present

## 2024-04-17 DIAGNOSIS — R7302 Impaired glucose tolerance (oral): Secondary | ICD-10-CM | POA: Diagnosis present

## 2024-04-17 DIAGNOSIS — R269 Unspecified abnormalities of gait and mobility: Secondary | ICD-10-CM

## 2024-04-17 DIAGNOSIS — I10 Essential (primary) hypertension: Secondary | ICD-10-CM | POA: Diagnosis present

## 2024-04-17 DIAGNOSIS — J4520 Mild intermittent asthma, uncomplicated: Secondary | ICD-10-CM | POA: Diagnosis not present

## 2024-04-17 DIAGNOSIS — H518 Other specified disorders of binocular movement: Secondary | ICD-10-CM | POA: Diagnosis present

## 2024-04-17 DIAGNOSIS — F22 Delusional disorders: Secondary | ICD-10-CM | POA: Diagnosis not present

## 2024-04-17 DIAGNOSIS — W19XXXA Unspecified fall, initial encounter: Secondary | ICD-10-CM

## 2024-04-17 DIAGNOSIS — K219 Gastro-esophageal reflux disease without esophagitis: Secondary | ICD-10-CM | POA: Diagnosis present

## 2024-04-17 DIAGNOSIS — F19950 Other psychoactive substance use, unspecified with psychoactive substance-induced psychotic disorder with delusions: Secondary | ICD-10-CM | POA: Diagnosis not present

## 2024-04-17 DIAGNOSIS — G35 Multiple sclerosis: Secondary | ICD-10-CM | POA: Diagnosis present

## 2024-04-17 DIAGNOSIS — H5123 Internuclear ophthalmoplegia, bilateral: Secondary | ICD-10-CM | POA: Diagnosis present

## 2024-04-17 DIAGNOSIS — Z803 Family history of malignant neoplasm of breast: Secondary | ICD-10-CM

## 2024-04-17 DIAGNOSIS — E559 Vitamin D deficiency, unspecified: Secondary | ICD-10-CM | POA: Diagnosis present

## 2024-04-17 DIAGNOSIS — H8109 Meniere's disease, unspecified ear: Secondary | ICD-10-CM | POA: Diagnosis present

## 2024-04-17 LAB — TSH: TSH: 1.907 u[IU]/mL (ref 0.350–4.500)

## 2024-04-17 LAB — CBC WITH DIFFERENTIAL/PLATELET
Abs Immature Granulocytes: 0.01 K/uL (ref 0.00–0.07)
Basophils Absolute: 0 K/uL (ref 0.0–0.1)
Basophils Relative: 1 %
Eosinophils Absolute: 0 K/uL (ref 0.0–0.5)
Eosinophils Relative: 1 %
HCT: 43.9 % (ref 36.0–46.0)
Hemoglobin: 14 g/dL (ref 12.0–15.0)
Immature Granulocytes: 0 %
Lymphocytes Relative: 31 %
Lymphs Abs: 1.6 K/uL (ref 0.7–4.0)
MCH: 29.7 pg (ref 26.0–34.0)
MCHC: 31.9 g/dL (ref 30.0–36.0)
MCV: 93.2 fL (ref 80.0–100.0)
Monocytes Absolute: 0.4 K/uL (ref 0.1–1.0)
Monocytes Relative: 8 %
Neutro Abs: 3.1 K/uL (ref 1.7–7.7)
Neutrophils Relative %: 59 %
Platelets: 207 K/uL (ref 150–400)
RBC: 4.71 MIL/uL (ref 3.87–5.11)
RDW: 13 % (ref 11.5–15.5)
WBC: 5.2 K/uL (ref 4.0–10.5)
nRBC: 0 % (ref 0.0–0.2)

## 2024-04-17 LAB — CBG MONITORING, ED: Glucose-Capillary: 127 mg/dL — ABNORMAL HIGH (ref 70–99)

## 2024-04-17 LAB — CBC
HCT: 45.3 % (ref 36.0–46.0)
Hemoglobin: 15 g/dL (ref 12.0–15.0)
MCH: 30.2 pg (ref 26.0–34.0)
MCHC: 33.1 g/dL (ref 30.0–36.0)
MCV: 91.3 fL (ref 80.0–100.0)
Platelets: 211 K/uL (ref 150–400)
RBC: 4.96 MIL/uL (ref 3.87–5.11)
RDW: 13 % (ref 11.5–15.5)
WBC: 5.8 K/uL (ref 4.0–10.5)
nRBC: 0 % (ref 0.0–0.2)

## 2024-04-17 LAB — BASIC METABOLIC PANEL WITH GFR
Anion gap: 10 (ref 5–15)
BUN: 13 mg/dL (ref 8–23)
CO2: 27 mmol/L (ref 22–32)
Calcium: 9.8 mg/dL (ref 8.9–10.3)
Chloride: 104 mmol/L (ref 98–111)
Creatinine, Ser: 0.75 mg/dL (ref 0.44–1.00)
GFR, Estimated: >60 mL/min (ref >60)
Glucose, Bld: 113 mg/dL — ABNORMAL HIGH (ref 70–99)
Potassium: 4.3 mmol/L (ref 3.5–5.1)
Sodium: 141 mmol/L (ref 135–145)

## 2024-04-17 LAB — VITAMIN D 25 HYDROXY (VIT D DEFICIENCY, FRACTURES): Vit D, 25-Hydroxy: 56.65 ng/mL (ref 30–100)

## 2024-04-17 LAB — T4, FREE: Free T4: 1.43 ng/dL — ABNORMAL HIGH (ref 0.61–1.12)

## 2024-04-17 MED ORDER — PANTOPRAZOLE SODIUM 40 MG IV SOLR
40.0000 mg | INTRAVENOUS | Status: DC
  Administered 2024-04-17 – 2024-04-19 (×3): 40 mg via INTRAVENOUS
  Filled 2024-04-17 (×2): qty 10

## 2024-04-17 MED ORDER — TRAMADOL HCL 50 MG PO TABS: 50.0000 mg | ORAL_TABLET | Freq: Three times a day (TID) | ORAL | Status: DC | PRN

## 2024-04-17 MED ORDER — ENOXAPARIN SODIUM 30 MG/0.3ML IJ SOSY
30.0000 mg | PREFILLED_SYRINGE | INTRAMUSCULAR | Status: DC
  Administered 2024-04-18 (×2): 30 mg via SUBCUTANEOUS
  Filled 2024-04-17: qty 0.3

## 2024-04-17 MED ORDER — SODIUM CHLORIDE 0.9 % IV SOLN
1000.0000 mg | INTRAVENOUS | Status: DC
  Administered 2024-04-17 – 2024-04-19 (×4): 1000 mg via INTRAVENOUS
  Filled 2024-04-17 (×5): qty 16

## 2024-04-17 MED ORDER — ONDANSETRON HCL 4 MG/2ML IJ SOLN
4.0000 mg | Freq: Four times a day (QID) | INTRAMUSCULAR | Status: DC | PRN
Start: 2024-04-17 — End: 2024-04-19

## 2024-04-17 MED ORDER — GADOBUTROL 1 MMOL/ML IV SOLN
5.0000 mL | Freq: Once | INTRAVENOUS | Status: AC | PRN
  Administered 2024-04-17 (×2): 5 mL via INTRAVENOUS

## 2024-04-17 MED ORDER — LACTATED RINGERS IV SOLN: INTRAVENOUS | Status: AC

## 2024-04-17 MED ORDER — IOHEXOL 350 MG/ML SOLN
75.0000 mL | Freq: Once | INTRAVENOUS | Status: AC | PRN
  Administered 2024-04-17 (×2): 75 mL via INTRAVENOUS

## 2024-04-17 MED ORDER — ONDANSETRON HCL 4 MG PO TABS: 4.0000 mg | ORAL_TABLET | Freq: Four times a day (QID) | ORAL | Status: DC | PRN

## 2024-04-17 MED ORDER — LORAZEPAM 1 MG PO TABS: 0.5000 mg | ORAL_TABLET | ORAL | Status: DC | PRN

## 2024-04-17 MED ORDER — LEVOTHYROXINE SODIUM 112 MCG PO TABS
112.0000 ug | ORAL_TABLET | Freq: Every day | ORAL | Status: DC
  Administered 2024-04-18 – 2024-04-23 (×7): 112 ug via ORAL
  Filled 2024-04-17 (×7): qty 1

## 2024-04-17 MED ORDER — SODIUM CHLORIDE 0.9 % IV SOLN
1000.0000 mg | Freq: Every day | INTRAVENOUS | Status: DC
  Filled 2024-04-17 (×2): qty 16

## 2024-04-17 NOTE — H&P (Signed)
 History and Physical    Patient: Ana Russell FMW:[id number] DOB: 1955-12-28 DOA: 04/17/2024 DOS: the patient was seen and examined on 04/17/2024 PCP: Clarice Nottingham, MD  Patient coming from: Home  Chief Complaint:  Chief Complaint  Patient presents with   Fall   Double Vision   HPI: Ana Russell is a 68 y.o. female with medical history significant of multiple sclerosis, history of previous C. difficile colitis, hypothyroidism, Mnire's disease, osteoporosis, endometrial hyperplasia who follows with [facility] neurolog[facility] Dr. Vear for her multiple sclerosis coming to the ER due to worsening vision changes.  Patient has history of GERD abnormalities but this has gotten worse lately.  She was unsteady on her feet.  She had a fall about 5 days ago after dizziness.  Since then her vision has become double.  Patient was post to follow-up with a neuro-ophthalmologist at [facility] but has not received a call back for follow-up.  Both eyes are showing problems with the vision.  She also noted lateral left eye deviation so she has been patching her eye intermittently in order to have her vision.  No other focal weakness.  In the ER patient was seen at evaluated.  Neurology consulted and recommended admission for MS flare up and high dose steroids.  Patient is hemodynamically stable with no new complaint.  Review of Systems: As mentioned in the history of present illness. All other systems reviewed and are negative. Past Medical History:  Diagnosis Date   C. difficile colitis 2013   Endometrial hyperplasia without atypia, simple 09/2003   NORMAL ENDOMETRIAL BIOPSY 03/2005   Fluid retention    in ears   Hypothyroidism    Meniere disease    MS (multiple sclerosis) (HCC) 02/25/2011   Osteoporosis 01/2018   T score -2.7   Past Surgical History:  Procedure Laterality Date   DILATION AND CURETTAGE OF UTERUS     TAB   HYSTEROSCOPY  09/2003   POLYPS   KNEE SURGERY  2011   bilateral knee  scopes   PELVIC LAPAROSCOPY     THYROIDECTOMY, PARTIAL     Social History:  reports that she has quit smoking. She has never used smokeless tobacco. She reports current alcohol use of about 6.0 standard drinks of alcohol per week. She reports that she does not use drugs.  No Known Allergies  Family History  Problem Relation Age of Onset   Diabetes Mother    Cancer Sister        Thyroid    Heart attack Sister    Breast cancer Maternal Aunt    Diabetes Paternal Grandfather    Multiple sclerosis Neg Hx     Prior to Admission medications   Medication Sig Start Date End Date Taking? Authorizing Provider  alendronate  (FOSAMAX ) 70 MG tablet Take 70 mg by mouth once a week. Takes on Friday 03/16/24   [provider]  Ascorbic Acid (VITAMIN C PO) Take by mouth.    [provider]  Biotin 1 MG CAPS Take by mouth.    [provider]  Cholecalciferol  (VITAMIN D  PO) Take 2,000 Units by mouth daily.  Takes 4000 units on Sundays only    [provider]  levothyroxine  (SYNTHROID ) 112 MCG tablet Take 112 mcg by mouth daily. 04/16/24   [provider]  metroNIDAZOLE  (METROGEL ) 1 % gel Apply 1 Application topically daily. 12/21/23   [provider]  rosuvastatin  (CRESTOR ) 5 MG tablet Take 5 mg by mouth daily.    [provider]  SYNTHROID  100 MCG tablet 100 mg daily. 09/10/14   [provider]    Physical Exam: Vitals:   04/17/24 1130 04/17/24 1524 04/17/24 1730 04/17/24 1745  BP:  (!) 167/92 (!) 176/95 (!) 158/86  Pulse:  (!) 54 64 61  Resp:  18 12 12   Temp:  (!) 97.5 F (36.4 C)    TempSrc:  Oral    SpO2:  100% 99% 99%  Weight: 54.4 kg     Height: 5' 6 (1.676 m)      Constitutional: NAD, calm, comfortable Eyes: Diplopia, conjugate gaze, lids and conjunctivae normal ENMT: Mucous membranes are moist. Posterior pharynx clear of any exudate or lesions.Normal dentition.  Neck: normal, supple, no masses, no  thyromegaly Respiratory: clear to auscultation bilaterally, no wheezing, no crackles. Normal respiratory effort. No accessory muscle use.  Cardiovascular: Regular rate and rhythm, no murmurs / rubs / gallops. No extremity edema. 2+ pedal pulses. No carotid bruits.  Abdomen: no tenderness, no masses palpated. No hepatosplenomegaly. Bowel sounds positive.  Musculoskeletal: Good range of motion, no joint swelling or tenderness, Skin: no rashes, lesions, ulcers. No induration Neurologic: CN 2-12 grossly intact. Sensation intact, DTR normal. Strength 5/5 in all 4.  Psychiatric: Normal judgment and insight. Alert and oriented x 3.  Anxious mood  Data Reviewed:  Temperature 97.5, blood pressure 176/95, pulse 54, there is of the CBC and chemistry appear to be within normal.  CT angiogram of the head and neck showed no hemorrhage no cervical carotid artery stenosis MRI of the brain showed numerous new nodular and ring-enhancing lesion within the cerebral hemispheres bilaterally including the lateral ventricles, left inferior frontal white matter and left posterior temporal white matter of.  Findings most likely represent exacerbation of multiple sclerosis.  There is also extensive periventricular white matter disease with a frond-like appearance correct history of multiple sclerosis no evidence of hemorrhage.  MRI of the cervical spine shows no cord compression or focal cord lesion.  There is an abnormal enhancement along the floor of the fourth ventricle at the dorsal pontomedullary junction which is consistent with abnormal T2 signal in the midbrain.  Mild spondylosis of C5-6 and C6-7 vertebrae  Assessment and Plan:  #1 acute exacerbation of multiple sclerosis: Patient will be admitted and neurology to follow.  Initiate IV Solu-Medrol  1000 mg daily per neurology recommendation for 5 days.  Will get PT and OT consultation.  Continue with neurochecks.  #2 hypothyroidism: Continue with levothyroxine  per home  dose  #3 hyperlipidemia: Continue with home statin  #4 anemia of chronic disease: H&H stable.  Continue to monitor  #5 vitamin D  deficiency: Continue replacement  #6 anxiety disorder: Continue home regimen  #7 history of asthma: No acute exacerbation.  Continue empiric treatment    Advance Care Planning:   Code Status: Full Code   Consults: Neurologist Dr. Merrianne  Family Communication: No family at bedside  Severity of Illness: The appropriate patient status for this patient is INPATIENT. Inpatient status is judged to be reasonable and necessary in order to provide the required intensity of service to ensure the patient's safety. The patient's presenting symptoms, physical exam findings, and initial radiographic and laboratory data in the context of their chronic comorbidities is felt to place them at high risk for further clinical deterioration. Furthermore, it is not anticipated that the patient will be medically stable for discharge from the hospital within 2 midnights of admission.   * I certify that at the point of admission  it is my clinical judgment that the patient will require inpatient hospital care spanning beyond 2 midnights from the point of admission due to high intensity of service, high risk for further deterioration and high frequency of surveillance required.*  AuthorBETHA SIM KNOLL, MD 04/17/2024 6:45 PM  For on call review [email or web address].

## 2024-04-17 NOTE — Telephone Encounter (Signed)
 See phone call 04/17/24

## 2024-04-17 NOTE — Telephone Encounter (Signed)
 Called spouse at [phone]. Relayed Dr. Duncan message. He verbalized understanding and agreeable to have appt set up next week for wife to see him for f/u. Aware I will call back in the morning to get this scheduled. He was on the other line with MD from [facility].

## 2024-04-17 NOTE — ED Provider Notes (Signed)
 [place] AT [facility] Provider Note   CSN: [id number] Arrival date & time: 04/17/24  1051     Patient presents with: Fall and Double Vision   Ana Russell is a 68 y.o. female history of MS here for evaluation of vision changes.  Followed by Dr. Vear with [facility] neurology.  Has had worsening vision changes and unsteadiness on her feet however about 5 days ago had fall due to dizziness subsequently developed double vision. Neuro-optho ref to [facility]: Lambert Rhonda W was sent however has not heard back. Today came in for worsening vision changes.  Patient has difficult time describing if both eyes had vision changes when closing the other one.  Has noted lateral left eye deviation.  Has been patching her eyes intermittently to help.  No headache, numbness, weakness, chest pain, shortness of breath, back pain.   HPI     Prior to Admission medications   Medication Sig Start Date End Date Taking? Authorizing Provider  alendronate  (FOSAMAX ) 70 MG tablet Take 70 mg by mouth once a week. Takes on Friday 03/16/24  Yes [provider]  Ascorbic Acid (VITAMIN C PO) Take by mouth.   Yes [provider]  Cholecalciferol  (VITAMIN D  PO) Take 2,000 Units by mouth daily.  Takes 4000 units on Sundays only   Yes [provider]  levothyroxine  (SYNTHROID ) 112 MCG tablet Take 112 mcg by mouth daily. 04/16/24  Yes [provider]  rosuvastatin  (CRESTOR ) 5 MG tablet Take 5 mg by mouth daily.   Yes [provider]    Allergies: Patient has no known allergies.    Review of Systems  Constitutional: Negative.   Eyes:  Positive for visual disturbance. Negative for pain, discharge, redness and itching.  Respiratory: Negative.    Cardiovascular: Negative.   Gastrointestinal: Negative.   Genitourinary: Negative.   Musculoskeletal: Negative.   Skin: Negative.   Neurological: Negative.   All other systems reviewed and are negative.   Updated  Vital Signs BP (!) 154/96 (BP Location: Left Arm)   Pulse 76   Temp 98 F (36.7 C) (Oral)   Resp 18   Ht 5' 6 (1.676 m)   Wt 54.4 kg   SpO2 99%   BMI 19.37 kg/m   Physical Exam Vitals and nursing note reviewed.  Constitutional:      General: She is not in acute distress.    Appearance: She is well-developed. She is not ill-appearing, toxic-appearing or diaphoretic.  HENT:     Head: Normocephalic and atraumatic.  Eyes:     General: Visual field deficit present.     Pupils: Pupils are equal, round, and reactive to light.  Cardiovascular:     Rate and Rhythm: Normal rate.     Pulses: Normal pulses.          Radial pulses are 2+ on the right side and 2+ on the left side.       Dorsalis pedis pulses are 2+ on the right side and 2+ on the left side.     Heart sounds: Normal heart sounds.  Pulmonary:     Effort: Pulmonary effort is normal. No respiratory distress.     Breath sounds: Normal breath sounds and air entry.  Abdominal:     General: Bowel sounds are normal. There is no distension.     Palpations: Abdomen is soft.     Tenderness: There is no abdominal tenderness.  Musculoskeletal:        General: Normal range  of motion.     Cervical back: Normal range of motion.     Comments: Full rom  Skin:    General: Skin is warm and dry.     Capillary Refill: Capillary refill takes less than 2 seconds.     Comments: No obvious rashes or lesions to exposed skin  Neurological:     Mental Status: She is alert.     Sensory: Sensation is intact.     Motor: Motor function is intact.     Comments: Internuclear opthalmoplegia bilaterally, left eye laterally deviated No facial droop.Intact sensation bil Cannot perform finger to nose Ambulatory however unsteady gait  Psychiatric:        Mood and Affect: Mood normal.    (all labs ordered are listed, but only abnormal results are displayed) Labs Reviewed  BASIC METABOLIC PANEL WITH GFR - Abnormal; Notable for the following  components:      Result Value   Glucose, Bld 113 (*)    All other components within normal limits  T4, FREE - Abnormal; Notable for the following components:   Free T4 1.43 (*)    All other components within normal limits  CBG MONITORING, ED - Abnormal; Notable for the following components:   Glucose-Capillary 127 (*)    All other components within normal limits  CBC WITH DIFFERENTIAL/PLATELET  TSH  HIV ANTIBODY (ROUTINE TESTING W REFLEX)  CBC  CREATININE, SERUM  CBC  COMPREHENSIVE METABOLIC PANEL WITH GFR  VITAMIN D  25 HYDROXY (VIT D DEFICIENCY, FRACTURES)  CBG MONITORING, ED  CBG MONITORING, ED    EKG: None  Radiology: CT ANGIO HEAD NECK W WO CM Result Date: 04/17/2024 CLINICAL DATA:  Fall, visual disturbance EXAM: CT ANGIOGRAPHY HEAD AND NECK WITH AND WITHOUT CONTRAST TECHNIQUE: Multidetector CT imaging of the head and neck was performed using the standard protocol during bolus administration of intravenous contrast. Multiplanar CT image reconstructions and MIPs were obtained to evaluate the vascular anatomy. Carotid stenosis measurements (when applicable) are obtained utilizing NASCET criteria, using the distal internal carotid diameter as the denominator. RADIATION DOSE REDUCTION: This exam was performed according to the departmental dose-optimization program which includes automated exposure control, adjustment of the mA and/or kV according to patient size and/or use of iterative reconstruction technique. CONTRAST:  75mL OMNIPAQUE  IOHEXOL  350 MG/ML SOLN COMPARISON:  None Available. FINDINGS: CT HEAD: There are areas of low attenuation in the cerebral white matter. There is no hemorrhage. No acute ischemic changes. No mass lesion. The ventricles are normal. Skull/sinuses/orbits: No significant abnormality. CTA NECK: CTA NECK Aortic arch: No proximal vessel stenosis. Right carotid: Normal Left carotid: Normal Right vertebral: Normal Left vertebral: Normal Soft tissues: No significant  abnormality Other comments: None CTA HEAD: CTA HEAD Right anterior circulation: The internal carotid artery is patent without significant stenosis. The anterior and middle cerebral arteries are patent without significant stenosis or proximal branch occlusion. No aneurysm. Left anterior circulation: The internal carotid artery is patent without significant stenosis. The anterior and middle cerebral arteries are patent without significant stenosis or proximal branch occlusion. No aneurysm. Posterior circulation: Both vertebral arteries are patent. There is no significant basilar stenosis. Both posterior cerebral arteries are patent without significant stenosis or proximal branch occlusion. No aneurysm. IMPRESSION: 1. No hemorrhage or other acute abnormality 2. No cervical carotid artery stenosis 3. No significant vertebrobasilar or intracranial disease Electronically Signed   By: Nancyann Burns M.D.   On: 04/17/2024 15:15   MR Brain W and Wo Contrast Result Date:  04/17/2024 EXAM: MRI BRAIN WITH AND WITHOUT CONTRAST 04/17/2024 02:42:50 PM TECHNIQUE: Multiplanar multisequence MRI of the head/brain was performed with and without the administration of intravenous contrast. COMPARISON: MRI of the head dated 02/23/2024. CLINICAL HISTORY: Neuro deficit, acute, stroke suspected. FINDINGS: BRAIN AND VENTRICLES: There are numerous new nodular and ring enhancing lesions present within the cerebral hemispheres bilaterally. There is a frond-like lesion along the body of the right lateral ventricle at the frontoparietal junction. There are nodular, appendable, and subependymal lesions along the posterior horn of the left lateral ventricle. There are ring enhancing lesions along the lateral surface of the occipital horn of the left lateral ventricle. There is a nodular enhancing lesion adjacent to the right occipital horn. There is a ring enhancing lesion present medially within the left inferior frontal white matter. There is a  ring enhancing lesion within the left posterior temporal white matter. There is also a ring enhancing lesion lateral to the temporal horn of the right lateral ventricle. On the FLAIR sequence, there is extensive periventricular white matter disease, with a frond-like appearance characteristic of multiple sclerosis. There is no evidence of hemorrhage. There are numerous foci of mildly increased signal on the diffusion-weighted images, compatible with facilitated diffusion. There is no restricted diffusion evident. ORBITS: No acute abnormality. SINUSES: No acute abnormality. BONES AND SOFT TISSUES: Normal bone marrow signal and enhancement. No acute soft tissue abnormality. IMPRESSION: 1. Numerous new nodular and ring enhancing lesions within the cerebral hemispheres bilaterally, including the lateral ventricles, left inferior frontal white matter, and left posterior temporal white matter. Findings most likely represent exacerbation of multiple sclerosis, but clinical correlation is recommended. 2. Extensive periventricular white matter disease with a frond-like appearance characteristic of multiple sclerosis. 3. No evidence of hemorrhage or restricted diffusion. Electronically signed by: evalene coho 04/17/2024 03:14 PM EDT RP Workstation: [id number]   MR Cervical Spine W and Wo Contrast Result Date: 04/17/2024 CLINICAL DATA:  Ataxia. Nontraumatic. Cervical pathology suspected. EXAM: MRI CERVICAL SPINE WITHOUT AND WITH CONTRAST TECHNIQUE: Multiplanar and multiecho pulse sequences of the cervical spine, to include the craniocervical junction and cervicothoracic junction, were obtained without and with intravenous contrast. CONTRAST:  5mL GADAVIST  GADOBUTROL  1 MMOL/ML IV SOLN COMPARISON:  02/23/2024 FINDINGS: Alignment: Normal Vertebrae: No fracture or focal bone lesion. Cord: No cord compression or focal cord lesion. Ample subarachnoid space surrounds the cord. No abnormal contrast enhancement of the cord.  Abnormal enhancement is noted along the floor the fourth ventricle at the dorsal pontomedullary junction. Question abnormal T2 signal in the midbrain. Findings could be seen with demyelinating disease. See results of brain MRI. Posterior Fossa, vertebral arteries, paraspinal tissues: See above. Disc levels: No significant finding from the foramen magnum through C4-5. C5-6: Mild spondylosis.  Mild right foraminal narrowing. C6-7: Mild spondylosis.  No stenosis. C7-T1: Normal IMPRESSION: 1. No cord compression or focal cord lesion. No abnormal contrast enhancement of the cord. 2. Abnormal enhancement along the floor of the fourth ventricle at the dorsal pontomedullary junction. Question abnormal T2 signal in the midbrain. Findings could be seen with demyelinating disease. See results of brain MRI. 3. Mild spondylosis at C5-6 and C6-7. Mild right foraminal narrowing at C5-6. Electronically Signed   By: Oneil Officer M.D.   On: 04/17/2024 15:11     Procedures   Medications Ordered in the ED  levothyroxine  (SYNTHROID ) tablet 112 mcg (has no administration in time range)  enoxaparin  (LOVENOX ) injection 30 mg (has no administration in time range)  lactated ringers  infusion (has  no administration in time range)  traMADol  (ULTRAM ) tablet 50 mg (has no administration in time range)  ondansetron  (ZOFRAN ) tablet 4 mg (has no administration in time range)    Or  ondansetron  (ZOFRAN ) injection 4 mg (has no administration in time range)  methylPREDNISolone  sodium succinate (SOLU-MEDROL ) 1,000 mg in sodium chloride  0.9 % 50 mL IVPB (has no administration in time range)    And  pantoprazole  (PROTONIX ) injection 40 mg (has no administration in time range)  iohexol  (OMNIPAQUE ) 350 MG/ML injection 75 mL (75 mLs Intravenous Contrast Given 04/17/24 1502)  gadobutrol  (GADAVIST ) 1 MMOL/ML injection 5 mL (5 mLs Intravenous Contrast Given 04/17/24 3167)   68 year old history of MS here for evaluation of abnormal vision.  She  has had some chronic vision changes and weakness followed with [facility] neurology.  He supposed to follow-up with neuro Optho however had fall on Friday and noted left eye gaze deviation and diplopia. Has a difficult time describing her diplopia. She has internuclear ophthalmoplegia bilaterally. Left eye lateral gaze.  Saw neuro 2 months ago for abnormal gait and bilateral vision changes had MRI which did not show a significant findings at that time. Plan on labs, imaging and discussion with Neuro. she has no obvious traumatic injury on exam.  Labs and imaging personally viewed and interpreted:  CBC without leukocytosis Metabolic panel without significant abnormality MRI brainshows new lesions consistent with exacerbation of MS. W MR cervical wo acute cervical lesions CTA head/neck wo acute abnormality  Discussed with Dr. Merrianne with neurology recommends Solu-Medrol  1000 mg daily close CBG checks, CBC, BMP daily. Will see in consult.  Patient reassessed.  Agreeable for admission.  Will discuss medicine.  Discussed with Dr. Sim with medicine agreeable to eval patient for admission  The patient appears reasonably stabilized for admission considering the current resources, flow, and capabilities available in the ED at this time, and I doubt any other [facility] requiring further screening and/or treatment in the ED prior to admission.                                    Medical Decision Making Amount and/or Complexity of Data Reviewed External Data Reviewed: labs, radiology and notes. Labs: ordered. Decision-making details documented in ED Course. Radiology: ordered and independent interpretation performed. Decision-making details documented in ED Course.  Risk OTC drugs. Prescription drug management. Parenteral controlled substances. Decision regarding hospitalization. Diagnosis or treatment significantly limited by social determinants of health.       Final diagnoses:  Multiple  sclerosis exacerbation [facility])    ED Discharge Orders     None          Mireya Meditz A, PA-C 04/17/24 2300    Kommor, Lum, MD 04/18/24 [phone]

## 2024-04-17 NOTE — Telephone Encounter (Signed)
 Pt husband called to informed Md that Pt has been took to ER , and they Plan on Keeping PT the patient husband wanted to speak to Nurse or MD about what's going with Pt   Call husband  [phone]- [phone]

## 2024-04-17 NOTE — ED Triage Notes (Signed)
 Pt came in via POV d/t falling Friday morning & hitting the back of head on hard ground outside (not on thinners) & then she went to the eye Dr that day d/t double vision afterwards. She was then recommended to a Neuropharmacologist (per husband) & then Saturday her Lt eye is pointing in the wrong direction (towards the left) & that pupil is dilated. Pt can still see out of the Lt eye but has to close the Rt eye & turn her head for the eye to face you to see clearly. Also reports having a hard time walking recently, tripping more than usual & becoming a higher fall risk d/t worsening balance. A/Ox4, denies current pain.

## 2024-04-17 NOTE — Progress Notes (Signed)
 Subjective Patient ID: Ana Russell is a 68 y.o. female.  Chief Complaint  Patient presents with  . Blurred Vision    Pt fell and hit the back of her head on the ground Friday. Pt has progressively worse double vision and now her left eye is slightly dialated and off center.     The following information was reviewed by members of the visit team:  Tobacco  Allergies  Meds  Med Hx  Surg Hx  Fam Hx      68 year old female with history of MS presents for evaluation of increasing dizziness, diplopia and difficulty walking after falling and hitting her head days ago.  Patient states she is unsure how she fell but believes she hit the back of her head on the ground 4 days ago.  Since that time she has experienced worsening double vision and some dizziness.  She has also noticed that her left eye has been deviated to the left and her pupil appears to be dilated.  She does not complain of any headache or neck pain.  She states she is starting to feel nauseated but has not vomited.  States she discussed her double vision with an eye doctor and is supposed to be seeing a neurologist for further evaluation of her symptoms.  She is concerned however that her symptoms are getting worse instead of better.  She has no other complaint of pain, weakness, numbness.    Review of Systems  Constitutional:  Negative for chills and fever.  HENT:  Negative for congestion, ear pain, sinus pressure, sinus pain and sore throat.   Eyes:  Positive for visual disturbance. Negative for pain and redness.  Respiratory:  Negative for cough and shortness of breath.   Cardiovascular:  Negative for chest pain.  Gastrointestinal:  Positive for nausea. Negative for diarrhea and vomiting.  Musculoskeletal:  Negative for arthralgias, back pain, myalgias and neck pain.  Skin:  Negative for rash.  Neurological:  Positive for dizziness and light-headedness. Negative for syncope, weakness, numbness and headaches.   Hematological:  Negative for adenopathy. Does not bruise/bleed easily.    Objective Physical Exam Vitals reviewed.  Constitutional:      General: She is not in acute distress.    Appearance: Normal appearance. She is not ill-appearing, toxic-appearing or diaphoretic.  HENT:     Head: Normocephalic and atraumatic.     Comments: No tenderness to palpation on the head, no obvious hematoma, deformity or other obvious sign of injury    Nose: Nose normal. No congestion or rhinorrhea.     Mouth/Throat:     Mouth: Mucous membranes are moist.     Pharynx: No oropharyngeal exudate or posterior oropharyngeal erythema.   Eyes:     Comments: Pupils appear equal and reactive, left eye however is deviated laterally. Left eye unable to move medially and appears somewhat decreased moving superiorly and inferiorly.  EOM appears normal on the right  Pulmonary:     Effort: Pulmonary effort is normal. No respiratory distress.   Musculoskeletal:        General: Normal range of motion.     Cervical back: Normal range of motion and neck supple. No rigidity or tenderness.   Skin:    General: Skin is warm and dry.     Findings: No rash.   Neurological:     General: No focal deficit present.     Mental Status: She is alert and oriented to person, place, and time.  Cranial Nerves: No cranial nerve deficit.     Motor: No weakness.   Psychiatric:        Mood and Affect: Mood normal.     Assessment/Plan 68 year old female with history of MS and worsening diplopia, dizziness, deviated left eye after fall 4 days ago. Symptoms and physical exam findings show a deviated left eye with abnormal EOMs.  Brief neuroexam is unremarkable Given nature of symptoms in the setting of recent head injury recommend prompt evaluation in the emergency department. Discussed possible EMS transport, patient prefers to be taken by her husband and will go to ER by private vehicle.  Urgent Care Disposition:  Follow up  with ED   Electronically signed: Franky Floria Finder, PA-C 04/17/2024  10:25 AM

## 2024-04-17 NOTE — ED Provider Triage Note (Signed)
 Emergency Medicine Provider Triage Evaluation Note  Ana Russell , a 68 y.o. female  was evaluated in triage.  Pt complains of double vision.  The patient has a history of MS, reportedly had been remission.  She had a fall a few days ago.  Since then she has been unsteady on her feet.  She denies any headaches.  She has notably had double vision, no vision loss, no focal numbness or weakness.  Review of Systems  Positive: Double vision Negative: Vision loss, focal weakness, numbness, headache, neck pain  Physical Exam  BP (!) 148/83 (BP Location: Left Arm)   Pulse 73   Temp 97.7 F (36.5 C)   Resp 17   Ht 5' 6 (1.676 m)   Wt 54.4 kg   SpO2 100%   BMI 19.37 kg/m  Gen:   Awake, no distress, GCS 15 Resp:  Normal effort MSK:   Moves extremities without difficulty Neuro:  Pt notably on EOMs will not cross midline for either eye. Pupils symmetrically reactive to light. No nystagmus. 5/5 strength and intact sensation to light touch all four extremities.  Medical Decision Making  Medically screening exam initiated at 11:42 AM.  Appropriate orders placed.  Ana Russell was informed that the remainder of the evaluation will be completed by another provider, this initial triage assessment does not replace that evaluation, and the importance of remaining in the ED until their evaluation is complete.  The patient has a bilateral internuclear ophthalmoplegia on exam. Considered, CVA, MS flare, aneurysm, intracranial mass, intracranial bleed. Workup initiated to include CTA Head and Neck and MRI Brain WWO and Cervical Spine WWO.    Jerrol Agent, MD 04/17/24 1415

## 2024-04-18 DIAGNOSIS — G35 Multiple sclerosis: Secondary | ICD-10-CM | POA: Diagnosis not present

## 2024-04-18 LAB — COMPREHENSIVE METABOLIC PANEL WITH GFR
ALT: 19 U/L (ref 0–44)
AST: 22 U/L (ref 15–41)
Albumin: 3.8 g/dL (ref 3.5–5.0)
Alkaline Phosphatase: 39 U/L (ref 38–126)
Anion gap: 14 (ref 5–15)
BUN: 12 mg/dL (ref 8–23)
CO2: 24 mmol/L (ref 22–32)
Calcium: 9.2 mg/dL (ref 8.9–10.3)
Chloride: 102 mmol/L (ref 98–111)
Creatinine, Ser: 1.06 mg/dL — ABNORMAL HIGH (ref 0.44–1.00)
GFR, Estimated: 57 mL/min — ABNORMAL LOW (ref >60)
Glucose, Bld: 150 mg/dL — ABNORMAL HIGH (ref 70–99)
Potassium: 3.5 mmol/L (ref 3.5–5.1)
Sodium: 140 mmol/L (ref 135–145)
Total Bilirubin: 0.9 mg/dL (ref 0.0–1.2)
Total Protein: 6.6 g/dL (ref 6.5–8.1)

## 2024-04-18 LAB — CREATININE, SERUM
Creatinine, Ser: 0.88 mg/dL (ref 0.44–1.00)
GFR, Estimated: >60 mL/min (ref >60)

## 2024-04-18 LAB — HIV ANTIBODY (ROUTINE TESTING W REFLEX): HIV Screen 4th Generation wRfx: NONREACTIVE

## 2024-04-18 LAB — CBC
HCT: 43.5 % (ref 36.0–46.0)
Hemoglobin: 14.3 g/dL (ref 12.0–15.0)
MCH: 30.4 pg (ref 26.0–34.0)
MCHC: 32.9 g/dL (ref 30.0–36.0)
MCV: 92.4 fL (ref 80.0–100.0)
Platelets: 199 K/uL (ref 150–400)
RBC: 4.71 MIL/uL (ref 3.87–5.11)
RDW: 12.8 % (ref 11.5–15.5)
WBC: 4.4 K/uL (ref 4.0–10.5)
nRBC: 0 % (ref 0.0–0.2)

## 2024-04-18 MED ORDER — HYDROXYZINE HCL 10 MG PO TABS
10.0000 mg | ORAL_TABLET | Freq: Every evening | ORAL | Status: DC | PRN
  Filled 2024-04-18: qty 1

## 2024-04-18 MED ORDER — ENOXAPARIN SODIUM 40 MG/0.4ML IJ SOSY: 40.0000 mg | PREFILLED_SYRINGE | INTRAMUSCULAR | Status: DC

## 2024-04-18 MED ORDER — MELATONIN 3 MG PO TABS
3.0000 mg | ORAL_TABLET | Freq: Every evening | ORAL | Status: DC | PRN
  Administered 2024-04-21: 3 mg via ORAL
  Filled 2024-04-18 (×2): qty 1

## 2024-04-18 NOTE — Progress Notes (Signed)
 Mobility Specialist Progress Note:    04/18/24 1244  Mobility  Activity Ambulated with assistance (In hallway)  Level of Assistance Standby assist, set-up cues, supervision of patient - no hands on  Assistive Device Other (Comment) (IV Pole)  Distance Ambulated (ft) 225 ft  Activity Response Tolerated well  Mobility Referral Yes  Mobility visit 1 Mobility  Mobility Specialist Start Time (ACUTE ONLY) 1203  Mobility Specialist Stop Time (ACUTE ONLY) 1214  Mobility Specialist Time Calculation (min) (ACUTE ONLY) 11 min   Received pt EOB and agreeable to mobility. No physical assistance needed. No c/o. Returned pt to room and left in bed. Personal belongings and call light within reach. All needs met.  Lavanda Pollack Mobility Specialist  Please contact via [employer] or  Rehab Office [phone]

## 2024-04-18 NOTE — Telephone Encounter (Signed)
 Called and spoke with husband/pt. He was visiting wife in the hospital. Scheduled f/u for 04/24/24 at 11am with Dr. Vear. He asked that I call [phone] and LVM with appt details. I called and LVM as requested.

## 2024-04-18 NOTE — Plan of Care (Signed)

## 2024-04-18 NOTE — Telephone Encounter (Addendum)
 Spoke w/ Dr. Vear who approved working pt in Monday afternoon 04/23/24. Called spouse at [phone].  Wife received day 1/5 IV steroids last night at 11pm.   Offered appt 04/23/24 at 3:30pm. He declined, stating they have another obligation at 4pm and unable to accept. Aware I will watch for cx and call back once I see something open.30 min notice ok if cx occurs.  Would like two options if possible.  FYI-Does not want to be on MS DMT that could possibly cause hair loss. Does not want any injectables either.

## 2024-04-18 NOTE — Consult Note (Signed)
 NEUROLOGY CONSULT NOTE   Date of service: April 18, 2024 Patient Name: Ana Russell MRN:  [id number] DOB:  [birth year] Chief Complaint: MS flare up Requesting Provider: Sim Emery CROME, MD  History of Present Illness  Ana Russell is a 68 y.o. female with hx of MS, taken off on aubagio  due to stable diease/advanced age who presents with a decline over the last month and a half. Symptoms include feeling more off balance, difficulty going downhill, vertigo, numbness in the roof of her mouth and lips. Over the last week, has developed diplopia and dysconjugate gaze. She does gardening and grows fruits and food and initially symptoms were attributed to working out in the heat. Symptoms persistent, had a fall a few days ago on concrete. She came to the ED. She had MRI Brain and C spine which demonstrated multiple ring enhancing lesions in BL cerebral hemisphere white matter. There was no active disease noted on MRI from June of this year.  She also reports that she has been taking more of Vit d tablets hoping that would help her.  She reports that Aubagio  was stopped due to hairloss and dryness.    ROS  Comprehensive ROS performed and pertinent positives documented in HPI   Past History   Past Medical History:  Diagnosis Date   C. difficile colitis 2013   Endometrial hyperplasia without atypia, simple 09/2003   NORMAL ENDOMETRIAL BIOPSY 03/2005   Fluid retention    in ears   Hypothyroidism    Meniere disease    MS (multiple sclerosis) (HCC) 02/25/2011   Osteoporosis 01/2018   T score -2.7    Past Surgical History:  Procedure Laterality Date   DILATION AND CURETTAGE OF UTERUS     TAB   HYSTEROSCOPY  09/2003   POLYPS   KNEE SURGERY  2011   bilateral knee scopes   PELVIC LAPAROSCOPY     THYROIDECTOMY, PARTIAL      Family History: Family History  Problem Relation Age of Onset   Diabetes Mother    Cancer Sister        Thyroid    Heart attack Sister    Breast  cancer Maternal Aunt    Diabetes Paternal Grandfather    Multiple sclerosis Neg Hx     Social History  reports that she has quit smoking. She has never used smokeless tobacco. She reports current alcohol use of about 6.0 standard drinks of alcohol per week. She reports that she does not use drugs.  No Known Allergies  Medications   Current Facility-Administered Medications:    enoxaparin  (LOVENOX ) injection 30 mg, 30 mg, Subcutaneous, Q24H, Garba, Mohammad L, MD   lactated ringers  infusion, , Intravenous, Continuous, Sim, Mohammad L, MD, Last Rate: 40 mL/hr at 04/17/24 2325, New Bag at 04/17/24 2325   levothyroxine  (SYNTHROID ) tablet 112 mcg, 112 mcg, Oral, Daily, Sim, Mohammad L, MD   methylPREDNISolone  sodium succinate (SOLU-MEDROL ) 1,000 mg in sodium chloride  0.9 % 50 mL IVPB, 1,000 mg, Intravenous, Q24H, Last Rate: 66 mL/hr at 04/17/24 2318, 1,000 mg at 04/17/24 2318 **AND** pantoprazole  (PROTONIX ) injection 40 mg, 40 mg, Intravenous, Q24H, [name], MD, 40 mg at 04/17/24 2317   ondansetron  (ZOFRAN ) tablet 4 mg, 4 mg, Oral, Q6H PRN **OR** ondansetron  (ZOFRAN ) injection 4 mg, 4 mg, Intravenous, Q6H PRN, Garba, Mohammad L, MD   traMADol  (ULTRAM ) tablet 50 mg, 50 mg, Oral, Q8H PRN, Sim Emery CROME, MD  Vitals   Vitals:   04/17/24 1745 04/17/24 1919 04/17/24  2201 04/18/24 0001  BP: (!) 158/86  (!) 154/96 (!) 148/82  Pulse: 61  76 (!) 59  Resp: 12  18 18   Temp:  98 F (36.7 C) 97.6 F (36.4 C) 98.8 F (37.1 C)  TempSrc:  Oral Oral Oral  SpO2: 99%  99% 100%  Weight:      Height:        Body mass index is 19.37 kg/m.   Physical Exam   General: Laying comfortably in bed; in no acute distress.  HENT: Normal oropharynx and mucosa. Normal external appearance of ears and nose.  Neck: Supple, no pain or tenderness  CV: No JVD. No peripheral edema.  Pulmonary: Symmetric Chest rise. Normal respiratory effort.  Abdomen: Soft to touch, non-tender.  Ext: No cyanosis,  edema, or deformity  Skin: No rash. Normal palpation of skin.   Musculoskeletal: Normal digits and nails by inspection. No clubbing.   Neurologic Examination  Mental status/Cognition: Alert, oriented to self, place, month and year, good attention.  Speech/language: Fluent, comprehension intact, object naming intact, repetition intact.  Cranial nerves:   CN II Pupils equal and reactive to light, no VF deficits    CN III,IV,VI EOM with R INO, left eye exotropia    CN V normal sensation in V1, V2, and V3 segments bilaterally    CN VII no asymmetry, no nasolabial fold flattening    CN VIII normal hearing to speech    CN IX & X normal palatal elevation, no uvular deviation    CN XI 5/5 head turn and 5/5 shoulder shrug bilaterally    CN XII midline tongue protrusion    Motor:  Muscle bulk: poor, tone normal, pronator drift none tremor none Mvmt Root Nerve  Muscle Right Left Comments  SA C5/6 Ax Deltoid 5 5   EF C5/6 Mc Biceps 5 5   EE C6/7/8 Rad Triceps 5 5   WF C6/7 Med FCR     WE C7/8 PIN ECU     F Ab C8/T1 U ADM/FDI 5 5   HF L1/2/3 Fem Illopsoas 5 4+   KE L2/3/4 Fem Quad     DF L4/5 D Peron Tib Ant 5 5   PF S1/2 Tibial Grc/Sol 5 5    Sensation:  Light touch Intact throughout   Pin prick    Temperature    Vibration   Proprioception    Coordination/Complex Motor:  - Finger to Nose intact BL - Heel to shin with - Rapid alternating movement are slowed slightly BL - Gait: Stride length short. Arm swing poor. Base width narrow. Walks with a cane.  Labs/Imaging/Neurodiagnostic studies   CBC:  Recent Labs  Lab 09-May-2024 1208 05-09-2024 2306  WBC 5.2 5.8  NEUTROABS 3.1  --   HGB 14.0 15.0  HCT 43.9 45.3  MCV 93.2 91.3  PLT 207 211   Basic Metabolic Panel:  Lab Results  Component Value Date   NA 141 May 09, 2024   K 4.3 2024/05/09   CO2 27 09-May-2024   GLUCOSE 113 (H) 05/09/2024   BUN 13 09-May-2024   CREATININE 0.75 05/09/2024   CALCIUM  9.8 05-09-2024   GFRNONAA >60  05/09/24   GFRAA 74 02/27/2020   Lipid Panel:  Lab Results  Component Value Date   LDLCALC 115 (H) 04/12/2019   HgbA1c:  Lab Results  Component Value Date   HGBA1C 6.1 (H) 02/27/2020   Urine Drug Screen: No results found for: LABOPIA, COCAINSCRNUR, LABBENZ, AMPHETMU, THCU, LABBARB  Alcohol Level No  results found for: [facility] INR  Lab Results  Component Value Date   INR 1.0 06/26/2008   APTT  Lab Results  Component Value Date   APTT 30 06/26/2008   AED levels: No results found for: PHENYTOIN, ZONISAMIDE, LAMOTRIGINE, LEVETIRACETA  MRI Brain w + w/o C(Personally reviewed): 1. Numerous new nodular and ring enhancing lesions within the cerebral hemispheres bilaterally, including the lateral ventricles, left inferior frontal white matter, and left posterior temporal white matter. Findings most likely represent exacerbation of multiple sclerosis, but clinical correlation is recommended. 2. Extensive periventricular white matter disease with a frond-like appearance characteristic of multiple sclerosis. 3. No evidence of hemorrhage or restricted diffusion.  MRI C spine w + w/o C(Personally reviewed): 1. No cord compression or focal cord lesion. No abnormal contrast enhancement of the cord. 2. Abnormal enhancement along the floor of the fourth ventricle at the dorsal pontomedullary junction. Question abnormal T2 signal in the midbrain. Findings could be seen with demyelinating disease. See results of brain MRI. 3. Mild spondylosis at C5-6 and C6-7. Mild right foraminal narrowing at C5-6.  ASSESSMENT   Debroh Sieloff is a 68 y.o. female ith hx of MS, taken off on aubagio  due to stable diease/advanced age who presents with progressive dizziness, blurred vision, dysequilibrium and fals over the last month and half. MRI with multiple new nodular and ring enhancing lesions within the cerebral hemispheres bilaterally, including the lateral ventricles, left  inferior frontal white matter, and left posterior temporal white matter.  Her presentation is consistent with MS flare up.  RECOMMENDATIONS  - IVMP daily x 5 doses. PPI while on steroids. Can be transitioned to PO steroids to finish the full 5 days course if she is improved. - PT/OT eval. - Vit D levels, been taking more than she is supposed to and reports prior hx of elevated Vit D levels. - follow up with Dr. Charlie Crete with [facility]. ______________________________________________________________________    Signed, Ronn Smolinsky, MD [facility]

## 2024-04-18 NOTE — TOC CM/SW Note (Addendum)
 Transition of Care [facility]) - Inpatient Brief Assessment   Patient Details  Name: Ana Russell MRN: [id number] Date of Birth: 09-09-1955  Transition of Care [facility]) CM/SW Contact:    Lauraine FORBES Saa, LCSWA Phone Number: 04/18/2024, 8:57 AM   Clinical Narrative:  8:57 AM Per chart review, patient resides at home. Patient has a PCP and insurance. Patient does not have SNF/HH/DME history. Patient's preferred pharmacy's are [facility] [id number], [facility] [place] and [facility] [place]. No TOC needs were identified at this time. TOC will continue to follow and be available to assist.  Transition of Care Asessment: Insurance and Status: Insurance coverage has been reviewed Patient has primary care physician: Yes Home environment has been reviewed: Private Residence Prior level of function:: N/A Prior/Current Home Services: No current home services Social Drivers of Health Review: SDOH reviewed no interventions necessary Readmission risk has been reviewed: Yes (Currently Green 8%) Transition of care needs: no transition of care needs at this time

## 2024-04-18 NOTE — Progress Notes (Addendum)
 NEUROLOGY CONSULT FOLLOW UP NOTE   Date of service: April 18, 2024 Patient Name: Ana Russell MRN:  [id number] DOB:  [birth year]  Interval Hx/subjective  PT seen bedside this AM, in good spirits. States that she went to her Opthomalogist on Thursday because of her double vision, and then was supposed to follow up with outpatient neurology at [facility]. However, referral had not gone through yet. She was able to walk this morning but still felt off-balance.   Vitals   Vitals:   04/17/24 1919 04/17/24 2201 04/18/24 0001 04/18/24 0300  BP:  (!) 154/96 (!) 148/82 123/87  Pulse:  76 (!) 59 85  Resp:  18 18 18   Temp: 98 F (36.7 C) 97.6 F (36.4 C) 98.8 F (37.1 C) 97.8 F (36.6 C)  TempSrc: Oral Oral Oral Oral  SpO2:  99% 100% 96%  Weight:      Height:         Body mass index is 19.37 kg/m.  Physical Exam   Constitutional: Appears well-developed and well-nourished.  Psych: Affect appropriate to situation.  Eyes: No scleral injection.  HENT: No OP obstrucion.  Head: Normocephalic.  Cardiovascular: Normal rate and regular rhythm.  Respiratory: Effort normal, non-labored breathing.  GI: Soft.  No distension. There is no tenderness.  Skin: WDI.   Neurologic Examination   Neuro: *Ment: A&O x4. Follows multi-step commands.  *Speech: No dysarthria or aphasia, able to name and repeat. *Memory intact *CN:    I: Deferred   II,III: PERRLA, visual fields intact, exotropia present, pupillary constriction appropriate   III,IV,VI: EOMI w/o nystagmus, L eye not able to adduct, R eye not able to adduct. Fixates alternately with each eye; when she fixates with right eye in central position, left eye deviates laterally to the left and vice versa when fixating with left eye. With her glasses on, she wears a dark filter on the left sided lens which she states ameliorates her double vision.    V: Sensation intact from V1 to V3 to LT   VII: Eyelid closure was full.  Smile  symmetric.   VIII: Hearing intact to voice   IX,X: Voice normal, palate elevates symmetrically    XI: SCM/trap 5/5 bilat   XII: Tongue protrudes midline, no atrophy or fasciculations  *Motor:   Normal bulk.  No tremor, rigidity or bradykinesia. No pronator drift. LUE: 5/5 strength, 5/5 grip strength  RUE: 5/5 strength, 5/5 grip strength  LLE: 4/5 hip flexion, 5/5 knee extension and flexion, 5/5 plantar and dorsiflexion RLE: 5/5 hip flexion, 4/5 knee extension and flexion, 5/5 plantar and dorsiflexion  *Sensory: Intact to light touch, *Coordination:  Finger-to-nose mild dysmetria, slowed rapid alternating motions . *Reflexes:  2+ and symmetric throughout without clonus; toes down-going bilat *Gait: Deferred    Medications  Current Facility-Administered Medications:    enoxaparin  (LOVENOX ) injection 30 mg, 30 mg, Subcutaneous, Q24H, Garba, Mohammad L, MD   hydrOXYzine  (ATARAX ) tablet 10 mg, 10 mg, Oral, QHS PRN, Khaliqdina, Salman, MD   lactated ringers  infusion, , Intravenous, Continuous, Sim Re L, MD, Last Rate: 40 mL/hr at 04/17/24 2325, New Bag at 04/17/24 2325   levothyroxine  (SYNTHROID ) tablet 112 mcg, 112 mcg, Oral, Daily, Sim Re L, MD, 112 mcg at 04/18/24 9390   melatonin tablet 3 mg, 3 mg, Oral, QHS PRN, Khaliqdina, Salman, MD   methylPREDNISolone  sodium succinate (SOLU-MEDROL ) 1,000 mg in sodium chloride  0.9 % 50 mL IVPB, 1,000 mg, Intravenous, Q24H, Last Rate: 66 mL/hr at 04/17/24 2318,  1,000 mg at 04/17/24 2318 **AND** pantoprazole  (PROTONIX ) injection 40 mg, 40 mg, Intravenous, Q24H, Khaliqdina, Salman, MD, 40 mg at 04/17/24 2317   ondansetron  (ZOFRAN ) tablet 4 mg, 4 mg, Oral, Q6H PRN **OR** ondansetron  (ZOFRAN ) injection 4 mg, 4 mg, Intravenous, Q6H PRN, Garba, Mohammad L, MD   traMADol  (ULTRAM ) tablet 50 mg, 50 mg, Oral, Q8H PRN, Sim Emery CROME, MD  Labs and Diagnostic Imaging   CBC:  Recent Labs  Lab 04/17/24 1208 04/17/24 2306 04/18/24 0509  WBC  5.2 5.8 4.4  NEUTROABS 3.1  --   --   HGB 14.0 15.0 14.3  HCT 43.9 45.3 43.5  MCV 93.2 91.3 92.4  PLT 207 211 199    Basic Metabolic Panel:  Lab Results  Component Value Date   NA 140 04/18/2024   K 3.5 04/18/2024   CO2 24 04/18/2024   GLUCOSE 150 (H) 04/18/2024   BUN 12 04/18/2024   CREATININE 1.06 (H) 04/18/2024   CALCIUM  9.2 04/18/2024   GFRNONAA 57 (L) 04/18/2024   GFRAA 74 02/27/2020   Lipid Panel:  Lab Results  Component Value Date   LDLCALC 115 (H) 04/12/2019   HgbA1c:  Lab Results  Component Value Date   HGBA1C 6.1 (H) 02/27/2020   Urine Drug Screen: No results found for: LABOPIA, COCAINSCRNUR, LABBENZ, AMPHETMU, THCU, LABBARB  Alcohol Level No results found for: [facility] INR  Lab Results  Component Value Date   INR 1.0 06/26/2008   APTT  Lab Results  Component Value Date   APTT 30 06/26/2008    CT angio Head and Neck with contrast: 1. No hemorrhage or other acute abnormality 2. No cervical carotid artery stenosis 3. No significant vertebrobasilar or intracranial disease  MRI Brain (Personally reviewed): 1. Numerous new nodular and ring enhancing lesions within the cerebral hemispheres bilaterally, including the lateral ventricles, left inferior frontal white matter, and left posterior temporal white matter. Findings most likely represent acute demyelinating lesions in the setting of exacerbation of  multiple sclerosis. 2. Extensive chronic periventricular white matter disease with a frond-like appearance characteristic of multiple sclerosis. 3. No evidence of hemorrhage or restricted diffusion.    Assessment  Ana Russell is a 68 y.o. female with past medical history of MS, recently taken off of aubaugio due to her stable disease, admitted for worsening balance and vision, and found to have an acute MS flare. Imagiing consistent with multiple new uniformly enhancing in addition to ring enhancing acute demyelinating lesions within  the cerebral hemispheres. She still is having double vision and feels off balance from walking.  - Exam today as documented above. Ocular motility deficits best localize to the pons.  - Vitamin D  level within the normal range at 56.65 - Impression: Acute MS exacerbation  Recommendations  - Continuing IVMP (Day 2/5) - PT/OT evaluation  - Neurology will continue to follow ______________________________________________________________________   Signed, Saad Nooruddin, MD Internal Medicine Resident, PGY-3  Electronically signed: Dr. Tiffinie Caillier

## 2024-04-18 NOTE — Progress Notes (Signed)
 [facility]   ROUNDING   NOTE Ana Russell FMW:[id number]  DOB: [birth year]  DOA: 04/17/2024  PCP: Clarice Nottingham, MD  04/18/2024,7:17 AM  LOS: 1 day    Code Status: Full code     from: Home current Dispo: Unclear    68 year old white female Multiple sclerosis diagnosed 1991-on DMA RD 2009 followed by Dr. Hansel hospitalized by neurology in 2009 for MS flare Not on Aubagio  2/2 hair loss, alopecia 1 month H/o diplopia ataxia disconjugate gaze Workup revealed in ED ring-enhancing cerebral hemisphere lesions-no lesions on MRI from 02/2024  Neurohospitalist consulted Started on steroids etc.   Plan  MS flare Still has diplopia-continues on Solu-Medrol  1000 since 8/12 with GI protection Protonix  No other symptoms currently Hopeful for recovery she has been ambulating around the unit I will saline lock her in the a.m. and see how she does over the next several days I think she needs to finish the full course of IV She may need another DMA RD      Data Reviewed:   Sodium 140 potassium 3.5 chloride 102 BUN/creatinine 12/1.0, WBC 4.4 hemoglobin 14 platelet 199 HIV negative CT angio no hemorrhage or acute abnormality and no significant vertebrobasilar intracranial disease MRI brain as above   DVT prophylaxis: Lovenox   Status is: Inpatient Remains inpatient appropriate because:   Requires further workup     Subjective: Awake coherent pleasant no distress  she says her dizziness is not  back to normal and she has disconjugate gaze and diplopia    Objective + exam Vitals:   04/17/24 1919 04/17/24 2201 04/18/24 0001 04/18/24 0300  BP:  (!) 154/96 (!) 148/82 123/87  Pulse:  76 (!) 59 85  Resp:  18 18 18   Temp: 98 F (36.7 C) 97.6 F (36.4 C) 98.8 F (37.1 C) 97.8 F (36.6 C)  TempSrc: Oral Oral Oral Oral  SpO2:  99% 100% 96%  Weight:      Height:       Filed Weights   04/17/24 1130  Weight: 54.4 kg     Examination: EOMI NCAT vision by direct confrontation  shows diplopia bilaterally Power 5/5 upper lower extremities Straight leg straight arm is intact Reflexes deferred ROM intact      Scheduled Meds:  enoxaparin  (LOVENOX ) injection  30 mg Subcutaneous Q24H   levothyroxine   112 mcg Oral Daily   pantoprazole  (PROTONIX ) IV  40 mg Intravenous Q24H   Continuous Infusions:  lactated ringers  40 mL/hr at 04/17/24 2325   methylPREDNISolone  (SOLU-MEDROL ) injection 1,000 mg (04/17/24 2318)    Time 40  Colen Grimes, MD  [facility]

## 2024-04-19 DIAGNOSIS — G35 Multiple sclerosis: Secondary | ICD-10-CM | POA: Diagnosis not present

## 2024-04-19 MED ORDER — ACETAMINOPHEN 325 MG PO TABS
650.0000 mg | ORAL_TABLET | Freq: Four times a day (QID) | ORAL | Status: DC | PRN
Start: 2024-04-19 — End: 2024-04-19
  Filled 2024-04-19 (×4): qty 2

## 2024-04-19 MED ORDER — ONDANSETRON HCL 4 MG PO TABS
4.0000 mg | ORAL_TABLET | Freq: Four times a day (QID) | ORAL | Status: DC | PRN
Start: 2024-04-19 — End: 2024-04-19
  Filled 2024-04-19 (×4): qty 1

## 2024-04-19 MED ORDER — ACETAMINOPHEN 325 MG PO TABS: 650.0000 mg | ORAL_TABLET | Freq: Four times a day (QID) | ORAL | Status: DC | PRN

## 2024-04-19 MED ORDER — ALENDRONATE SODIUM 10 MG PO TABS: 70.0000 mg | ORAL_TABLET | ORAL | Status: DC

## 2024-04-19 MED ORDER — ENOXAPARIN SODIUM 40 MG/0.4ML IJ SOSY
40.0000 mg | PREFILLED_SYRINGE | INTRAMUSCULAR | Status: DC
  Filled 2024-04-19: qty 0.4

## 2024-04-19 MED ORDER — ONDANSETRON HCL 4 MG/2ML IJ SOLN: 4.0000 mg | Freq: Four times a day (QID) | INTRAMUSCULAR | Status: DC | PRN

## 2024-04-19 MED ORDER — ONDANSETRON HCL 4 MG PO TABS: 4.0000 mg | ORAL_TABLET | Freq: Four times a day (QID) | ORAL | Status: DC | PRN

## 2024-04-19 MED ORDER — PANTOPRAZOLE SODIUM 40 MG PO TBEC
40.0000 mg | DELAYED_RELEASE_TABLET | Freq: Two times a day (BID) | ORAL | Status: DC
  Administered 2024-04-19 – 2024-04-22 (×8): 40 mg via ORAL
  Filled 2024-04-19 (×10): qty 1

## 2024-04-19 NOTE — Evaluation (Signed)
 Physical Therapy Evaluation Patient Details Name: Ana Russell MRN: [id number] DOB: 01/25/1956 Today's Date: 04/19/2024  History of Present Illness  68 year old  presented 04/17/24 with 1 month H/o diplopia ataxia disconjugate gaze. Workup revealed ring-enhancing cerebral hemisphere lesions; started on steroids; considering hospital at home program  PMH-MS, hypothyroidism, Mnire's disease, osteoporosis, endometrial hyperplasia  Clinical Impression   Pt admitted secondary to problem above with deficits below. PTA patient was independent with ambulation inside her home. She has a 3 level home and enjoys gardening. She carries either a cane or walking stick when she is walking in community. Pt currently requires cane for modified independent gait (without cane she has significant drift to left and right with pt able to self-correct). Educated on safety issues for home (she tends to not sleep well and walk throughout home at night--emphasized need for overhead lighting and not just nightlights). Provided with HEP for balance and core strengthening (see below medbridge access code). Anticipate patient will benefit from PT to address problems listed below. Will continue to follow acutely to maximize functional mobility, independence, and safety. May progress well enough with acute PT to not need OPPT--will continue to monitor.          If plan is discharge home, recommend the following:     Can travel by private vehicle        Equipment Recommendations None recommended by PT  Recommendations for Other Services       Functional Status Assessment Patient has had a recent decline in their functional status and demonstrates the ability to make significant improvements in function in a reasonable and predictable amount of time.     Precautions / Restrictions Precautions Precautions: Fall Recall of Precautions/Restrictions: Intact      Mobility  Bed Mobility Overal bed mobility:  Independent                  Transfers Overall transfer level: Independent Equipment used: None                    Ambulation/Gait Ambulation/Gait assistance: Modified independent (Device/Increase time) Gait Distance (Feet): 400 Feet Assistive device: Straight cane, None Gait Pattern/deviations: Step-through pattern, Drifts right/left   Gait velocity interpretation: >2.62 ft/sec, indicative of community ambulatory   General Gait Details: with cane, no drift noted; without cane drifts to Left more than rt; (wearing patch over left eye)  Stairs            Wheelchair Mobility     Tilt Bed    Modified Rankin (Stroke Patients Only)       Balance Overall balance assessment: Mild deficits observed, not formally tested                                           Pertinent Vitals/Pain Pain Assessment Pain Assessment: No/denies pain    Home Living Family/patient expects to be discharged to:: Private residence Living Arrangements: Spouse/significant other (in poor health; she assists him at times) Available Help at Discharge: Family;Available 24 hours/day (spouse can only provide supervision) Type of Home: House       Alternate Level Stairs-Number of Steps: flight Home Layout: Multi-level;Bed/bath upstairs (3 level home) Home Equipment: Cane - single point (walking stick)      Prior Function Prior Level of Function : Independent/Modified Independent;Driving;History of Falls (last six months)  Mobility Comments: usually carries cane in public and walking stick when walking her dog, otherwise no AD ADLs Comments: has stopped driving with recent vision changes     Extremity/Trunk Assessment   Upper Extremity Assessment Upper Extremity Assessment: Overall WFL for tasks assessed    Lower Extremity Assessment Lower Extremity Assessment:  (pt reports RLE is weaker)    Cervical / Trunk Assessment Cervical / Trunk  Assessment: Normal  Communication   Communication Communication: No apparent difficulties    Cognition Arousal: Alert Behavior During Therapy: WFL for tasks assessed/performed   PT - Cognitive impairments: No apparent impairments                         Following commands: Intact       Cueing Cueing Techniques: Verbal cues, Visual cues     General Comments General comments (skin integrity, edema, etc.): Discussed role of senses in maintaining balance. Encouraged use of overhead lights or lamps when ambulating at night (explained night lights may not be sufficient and need to especially turn on light over the indoor steps). Pt verbalized understanding    Exercises Other Exercises Other Exercises: SLS for up to 20 counts each leg; emphasis on maintaining tight core to improve balance Other Exercises: supine bridging with leg extension Other Exercises: supine SLR with emphasis on tightening core muscles  Access Code: [email or web address] URL: [email or web address] Date: 04/19/2024 Prepared by: [facility]  Exercises - Single Leg Stance  - 1 x daily - 7 x weekly - 1 sets - 10 reps - 20 sec hold - Supine Bridge with Leg Extension  - 1 x daily - 7 x weekly - 1 sets - 10 reps - 5 sec hold - Active Straight Leg Raise with Quad Set  - 1 x daily - 7 x weekly - 1 sets - 10 reps - - hold   Assessment/Plan    PT Assessment Patient needs continued PT services  PT Problem List Decreased strength;Decreased balance;Decreased mobility;Other (comment) (diplopia)       PT Treatment Interventions Gait training;Stair training;Functional mobility training;Therapeutic activities;Therapeutic exercise;Balance training;Neuromuscular re-education;Patient/family education    PT Goals (Current goals can be found in the Care Plan section)  Acute Rehab PT Goals Patient Stated Goal: improve her balance PT Goal Formulation: With patient Time For Goal Achievement: 05/03/24 Potential to  Achieve Goals: Good    Frequency Min 2X/week     Co-evaluation               AM-PAC PT 6 Clicks Mobility  Outcome Measure Help needed turning from your back to your side while in a flat bed without using bedrails?: None Help needed moving from lying on your back to sitting on the side of a flat bed without using bedrails?: None Help needed moving to and from a bed to a chair (including a wheelchair)?: None Help needed standing up from a chair using your arms (e.g., wheelchair or bedside chair)?: None Help needed to walk in hospital room?: None Help needed climbing 3-5 steps with a railing? : None 6 Click Score: 24    End of Session   Activity Tolerance: Patient tolerated treatment well Patient left: in bed;with call bell/phone within reach   PT Visit Diagnosis: Unsteadiness on feet (R26.81);Other abnormalities of gait and mobility (R26.89)    Time: 8877-8856 PT Time Calculation (min) (ACUTE ONLY): 21 min   Charges:   PT Evaluation $PT Eval Low Complexity: 1 Low   PT  General Charges $$ ACUTE PT VISIT: 1 Visit          Macario RAMAN, PT Acute Rehabilitation Services  Office [phone]   Macario SHAUNNA Soja 04/19/2024, 12:40 PM

## 2024-04-19 NOTE — Telephone Encounter (Signed)
 Hospital has called to inform they are not taking on any urgent referrals , only next available. They are scheduling into July of '2026

## 2024-04-19 NOTE — Plan of Care (Signed)

## 2024-04-19 NOTE — Progress Notes (Signed)
 Admission is deferred till 8/15 am. Per [facility] attending.

## 2024-04-19 NOTE — Progress Notes (Signed)
 [facility]   ROUNDING   NOTE Onesti Bonfiglio Haefele FMW:[id number]  DOB: 07/27/1956  DOA: 04/17/2024  PCP: Clarice Nottingham, MD  04/19/2024,9:27 AM  LOS: 2 days    Code Status: Full code     from: Home current Dispo: Unclear    68 year old white female Multiple sclerosis diagnosed 1991-on DMA RD 2009 followed by Dr. Hansel hospitalized by neurology in 2009 for MS flare Not on Aubagio  2/2 hair loss, alopecia 1 month H/o diplopia ataxia disconjugate gaze Workup revealed in ED ring-enhancing cerebral hemisphere lesions-no lesions on MRI from 02/2024  Neurohospitalist consulted Started on steroids etc.   Plan  MS flare Continues Solu-Medrol  every 24 pantoprazole  40 every 24 which I will change to p.o. Diplopia continues but strength is little better as per narrative She is very interested in hospital at home program after description of the same and neurology agrees and can follow via telemedicine  Impaired glucose tolerance On labs sugars less than 180-expect that this will come down once the steroids are weaned Would not check CBGs unless above 180  Hypothyroid Continue 112 Synthroid  outpatient reeval  Underlying?  Anxiety insomnia Continue Atarax  10 at bedtime as needed   BMI 19 with moderate malnutrition as evidenced by bitemporal wasting Supplement etc.     Data Reviewed:   Sodium 140 potassium 3.5 BUN/creatinine 12/1.0 WBC 4.4 hemoglobin 14 platelet 199  DVT prophylaxis: Lovenox   Status is: Inpatient Remains inpatient appropriate because:   Awaiting decision re:-hospital at home     Subjective: Feels overall much better was able to do some ankle pumps and leg raises today compared to yesterday Vision is still somewhat altered with diplopia Otherwise feels fine  Objective + exam Vitals:   04/18/24 1445 04/18/24 2156 04/19/24 0621 04/19/24 0811  BP: 134/78 (!) 154/75 (!) 140/78 138/77  Pulse: 76 62 66 94  Resp: 18 17 18    Temp: 98.5 F (36.9 C) 98 F (36.7 C)  98 F (36.7 C) 97.6 F (36.4 C)  TempSrc: Oral  Oral Oral  SpO2: 99% 100% 97% 96%  Weight:      Height:       Filed Weights   04/17/24 1130  Weight: 54.4 kg   Examination: Vision by direct confrontation seems intact with each eye-she still has some double vision and is patching the left she has some strabismus outwards on the left eye Chest is clear no wheeze Power is 5/5 Good praxis Abdomen soft no rebound No lower extremity edema   Scheduled Meds:  enoxaparin  (LOVENOX ) injection  40 mg Subcutaneous Q24H   levothyroxine   112 mcg Oral Daily   pantoprazole  (PROTONIX ) IV  40 mg Intravenous Q24H   Continuous Infusions:  methylPREDNISolone  (SOLU-MEDROL ) injection 1,000 mg (04/19/24 0001)    Time 20  Colen Grimes, MD  [facility]

## 2024-04-19 NOTE — Plan of Care (Signed)
 Hospital Home Plan of Care Note   Had a relatively lengthy discussion with patient at bedside as well as with patient's husband Winton Ernst at [phone].  Patient and husband are agreeable to the hospital home program. [facility]  After communication with Amber-social worker at [phone], will tentatively plan for admission on the morning of August 15. Consent obtained Clinical staff with the hospital home program [facility] to share paraphernalia as well as information about the program with the stakeholders at the facility. Attending made aware of plan to admit patient morning of August 15.  Hospital at Home Admission Criteria Checklist:  Formal consent explained in detail and signed at the bedside:Yes  Patient meets inpatient admission criteria (see below for further details)  Is pt medicare FFS( required for initial launch with plan to expand)? yes Lives within 25 mil/ 30 min from [facility] within [place](pt may stay with family member during admission who lives within 25 miles or 30 min from [facility] w[facility]in [facility]) ? yes  Hemodynamically stable with relatively low risk of clinical deterioration-not requiring ICU? yes Age >55?yes Does not require frequent touch-points or complex interventions/medications (ie Titrated Infusions (IV insulin, heparin drips, vasoactive drips, use of infused or injectable controlled substances, patients on insulin)?  Any Behavioral Health comorbidities likely to increase risk for in-home care (ie Acute delirium or experiencing a marked altered mental status and cause is not a treatable condition in the home)? no  Has the patient been on BIPAP during course of ED evaluation or hospitalization?no IF YES, Has the patient been off of BIPAP for >24 hours(If NO-THEN PATIENT DOES MEET INCLUSION CRITERIA)?n/a Needs oxygen at home (<6L)? no Active safety concerns (ie Unable  to use bedside commode independently and lacks caregiver support for safety- needs SNF placement, unable to obtain IV access)? no Has skin check been performed?pending  Has Physical Therapy screened the patient?yes Common admission diagnoses including: CAP, COPD Exacerbation, Acute on chronic heart failure, Cellulitis, UTI , dehydration, acute resp failure with hypoxia (requiring <5L)   Social Screening: Denies significant ETOH intake? no Does not smoke and understands may not smoke in the presence of oxygen? no  Patient states able to use iPad/phone for communication/has family who is able to use? yes  Patient has agreed to be compliant with medication and treatment regimen of the program?yes Any active drug use in patient or primary caregiver including daily dosing of methadone?  no Stable home environment ( access to appropriate heating in cold conditions and/or appropriate air conditioning in hot conditions and/or no running water/electricity)? yes  No aggressive pets at home? no Firearm present  with ability or willingness to store them unloaded in a locked case for duration of hospitalization? None reported   Ambulatory? yes (independently cane, walker  wheelchair bound, bed bound) Bed bugs present on home evaluation?no  Family support system in place? yes Patient feels safe at home does not endorse any violence? yes Any actively decompensated behavioral healthy issues including agitation/aggressive behavior? no   Patient requests food to be provided by hospital home program?yes PT/OT eval completed and not requiring SNF, ALF, inpatient rehab? yes   To be admitted to the Hospital at [facility] program, a patient generally must meet the following: 1. Requirement for Inpatient Level of Care: The patient's condition must necessitate an inpatient level of care. This is typically indicated by one or more of the following, depending on  their specific diagnosis: -Persistent tachycardia despite  appropriate treatment (e.g., for Heart Failure, UTI). - Persistent tachypnea (rapid breathing) or dyspnea (shortness of breath) that hasn't improved sufficiently with observation care (e.g., for Heart Failure, Pneumonia, Viral Illness, COVID). - Hypoxemia (low oxygen levels), such as a new need for oxygen, an increased need from baseline, or specific oxygen saturation levels (e.g., SpO2 <90-94% depending on the condition) that persist despite observation (e.g., for Heart Failure, COPD, Pneumonia, Viral Illness, COVID). - Need for Intravenous (IV) hydration due to an inability to maintain oral hydration, which persists despite observation care (e.g., for Cellulitis, UTI, Viral Illness, COVID). - Specific to Heart Failure: Persistent pulmonary edema, indicated by a new oxygen need, lack of improvement with IV diuretics, and ongoing tachypnea/dyspnea. - Specific to COPD: A decrease in known baseline resting oxygen saturation (SpO2) by 4% or more, or an increase in pre-existing supplemental oxygen requirements, which persists despite observation and requires continued close monitoring. - Specific to Pneumonia: A Pneumonia Severity Index (PSI) class IV (moderate risk). - Specific to Cellulitis: Failure of outpatient antibiotic therapy (indicated by progression or no improvement after a minimum of 48 hours on an adequate regimen) or a clinical presentation (like acuity or rapidity of progression) that requires the intensity of monitoring found in an inpatient setting. - Specific to UTI: Persistence or worsening of clinical findings like fever, pain, or dehydration despite observation care; presence of significant uropathy; suspected infection of an indwelling prosthetic device, stent, implant, or graft; or pregnancy with suspected pyelonephritis. 2. Appropriateness for Hospital at Home Setting: The patient's overall clinical picture, including the severity of their illness, their care needs, and  their medical history and comorbidities, must be suitable for management in the Hospital at Home environment. This essentially means that none of the exclusion criteria (listed below) are met.  Unified Exclusion Criteria for Hospital at Home Admission: A patient would not be eligible for Hospital at Home if any of the following are present: - Hemodynamic Instability: - Hypotension (low blood pressure) is present. - Respiratory Instability or Needs Beyond Program Capability: - There is a new need for invasive or noninvasive ventilatory assistance (like BiPAP or a ventilator). - Oxygenation is not sufficient, generally indicated if an FiO2 (fraction of inspired oxygen) of 45% (which is about 6 Liters/minute via nasal cannula) or more is required to keep oxygen saturation (SpO2) at 90% or greater. - Monitoring or Procedural Needs Beyond Program Capability: - There is a need for invasive monitoring, such as a pulmonary artery catheter or an arterial line. - There is a need for immediate-response telemetry monitoring (for dangerous arrhythmia detection and subsequent immediate intervention). - The required medication regimen is beyond the capabilities of Hospital at Home (e.g., dosing intervals are too frequent for home administration). - There is a need for a procedure that cannot be performed by the Hospital at [facility] team (e.g., significant wound debridement or abscess drainage for cellulitis, or percutaneous nephrostomy for a complicated UTI). - Significant Organ Dysfunction or Markers of Severe Illness: - Mental status is not at baseline, or there is altered mental status suggestive of inadequate perfusion. - Renal (kidney) function is unstable or showing an ongoing decline. - There is evidence of inadequate perfusion, such as metabolic acidosis or myocardial ischemia. - Uncompensated acidosis is present. - Condition-Specific Severity or Complications Making Home Care Unsuitable: -For  Heart Failure: Known severe cardiac valvular disease (e.g., aortic stenosis, mitral regurgitation); or severe peripheral edema that impairs the ability to urinate or  ambulate. -For COPD: Known concurrent comorbidity or finding that indicates a higher-risk COPD exacerbation (e.g., pulmonary fibrosis, cavitation, pleural effusion, pneumothorax, rib fracture). -For Pneumonia: Pneumonia Severity Index (PSI) class V (indicating high risk for inpatient mortality); known concurrent comorbidity or finding that indicates higher-risk pneumonia (e.g., pulmonary fibrosis, cavitation, large or loculated pleural effusion); or a concomitant serious infectious process like endocarditis or empyema. -For Cellulitis: Orbital, periorbital, or necrotizing infection is suspected; or a concomitant serious infectious process like endocarditis, septic emboli, or septic joint space infection. -For UTI: Urinary tract obstruction (e.g., kidney stone, bladder outlet obstruction); or a concomitant serious infectious process like endocarditis or septic emboli. -For Viral Illness & COVID-19: A concomitant serious infectious process like endocarditis or empyema. General Comorbidities or Status: -The patient is significantly immunosuppressed (this applies to Pneumonia, Cellulitis, UTI, Viral Illness, and COVID-19). -The patient meets inpatient admission criteria for a second diagnosis, or has care needs beyond the capabilities of Hospital at Home due to an active clinically significant comorbidity. (This is a general exclusion across all listed conditions)

## 2024-04-19 NOTE — Progress Notes (Signed)
 Mobility Specialist Progress Note:    04/19/24 0952  Mobility  Activity Ambulated with assistance (In hallway)  Level of Assistance Standby assist, set-up cues, supervision of patient - no hands on  Assistive Device Cane  Distance Ambulated (ft) 600 ft  Activity Response Tolerated well  Mobility Referral Yes  Mobility visit 1 Mobility  Mobility Specialist Start Time (ACUTE ONLY) [id number]  Mobility Specialist Stop Time (ACUTE ONLY) 0848  Mobility Specialist Time Calculation (min) (ACUTE ONLY) 10 min   Received pt in bed and agreeable to mobility. No physical assistance needed. No c/o. Returned to room without fault. Left pt in bed. Personal belongings and call light within reach. All needs met.  Lavanda Pollack Mobility Specialist  Please contact via [employer] or  Rehab Office [phone]

## 2024-04-20 DIAGNOSIS — G35 Multiple sclerosis: Secondary | ICD-10-CM | POA: Diagnosis not present

## 2024-04-20 LAB — CBC
HCT: 42.1 % (ref 36.0–46.0)
Hemoglobin: 13.9 g/dL (ref 12.0–15.0)
MCH: 30.2 pg (ref 26.0–34.0)
MCHC: 33 g/dL (ref 30.0–36.0)
MCV: 91.3 fL (ref 80.0–100.0)
Platelets: 198 K/uL (ref 150–400)
RBC: 4.61 MIL/uL (ref 3.87–5.11)
RDW: 13.2 % (ref 11.5–15.5)
WBC: 8.6 K/uL (ref 4.0–10.5)
nRBC: 0 % (ref 0.0–0.2)

## 2024-04-20 LAB — COMPREHENSIVE METABOLIC PANEL WITH GFR
ALT: 19 U/L (ref 0–44)
AST: 30 U/L (ref 15–41)
Albumin: 3.7 g/dL (ref 3.5–5.0)
Alkaline Phosphatase: 33 U/L — ABNORMAL LOW (ref 38–126)
Anion gap: 10 (ref 5–15)
BUN: 17 mg/dL (ref 8–23)
CO2: 22 mmol/L (ref 22–32)
Calcium: 8.9 mg/dL (ref 8.9–10.3)
Chloride: 109 mmol/L (ref 98–111)
Creatinine, Ser: 0.85 mg/dL (ref 0.44–1.00)
GFR, Estimated: >60 mL/min (ref >60)
Glucose, Bld: 162 mg/dL — ABNORMAL HIGH (ref 70–99)
Potassium: 4.9 mmol/L (ref 3.5–5.1)
Sodium: 141 mmol/L (ref 135–145)
Total Bilirubin: 1 mg/dL (ref 0.0–1.2)
Total Protein: 6.2 g/dL — ABNORMAL LOW (ref 6.5–8.1)

## 2024-04-20 LAB — PREALBUMIN: Prealbumin: 26 mg/dL (ref 18–38)

## 2024-04-20 MED ORDER — ALENDRONATE SODIUM 70 MG PO TABS
70.0000 mg | ORAL_TABLET | ORAL | Status: DC
  Administered 2024-04-20: 70 mg via ORAL
  Filled 2024-04-20 (×2): qty 1

## 2024-04-20 MED ORDER — ENSURE PLUS HIGH PROTEIN PO LIQD
237.0000 mL | Freq: Three times a day (TID) | ORAL | Status: DC
  Administered 2024-04-20 – 2024-04-22 (×5): 237 mL via ORAL
  Filled 2024-04-20 (×11): qty 237

## 2024-04-20 MED ORDER — SODIUM CHLORIDE 0.9 % IV SOLN
1000.0000 mg | INTRAVENOUS | Status: AC
  Administered 2024-04-20 – 2024-04-21 (×2): 1000 mg via INTRAVENOUS
  Filled 2024-04-20 (×3): qty 16

## 2024-04-20 MED ORDER — ADULT MULTIVITAMIN W/MINERALS CH
1.0000 | ORAL_TABLET | Freq: Every day | ORAL | Status: DC
  Administered 2024-04-20 – 2024-04-22 (×3): 1 via ORAL
  Filled 2024-04-20 (×5): qty 1

## 2024-04-20 MED ORDER — ACETAMINOPHEN 650 MG RE SUPP
650.0000 mg | Freq: Four times a day (QID) | RECTAL | Status: DC | PRN
  Filled 2024-04-20: qty 1

## 2024-04-20 MED ORDER — ACETAMINOPHEN 325 MG PO TABS
650.0000 mg | ORAL_TABLET | Freq: Four times a day (QID) | ORAL | Status: DC | PRN
  Filled 2024-04-20 (×5): qty 2

## 2024-04-20 NOTE — Progress Notes (Addendum)
 Mobility Specialist Progress Note:    04/20/24 1017  Mobility  Activity Ambulated with assistance  Level of Assistance Modified independent, requires aide device or extra time  Assistive Device Cane  Distance Ambulated (ft) 700 ft  Activity Response Tolerated well  Mobility Referral Yes  Mobility visit 1 Mobility  Mobility Specialist Start Time (ACUTE ONLY) [id number]  Mobility Specialist Stop Time (ACUTE ONLY) [id number]  Mobility Specialist Time Calculation (min) (ACUTE ONLY) 10 min   Received pt in bed and agreeable to mobility. No physical assistance needed. No c/o. Returned to room without fault and left pt in bed. Personal belongings and call light within reach. All needs met.  Lavanda Pollack Mobility Specialist  Please contact via [employer] or  Rehab Office [phone]

## 2024-04-20 NOTE — Progress Notes (Signed)
 RN completed tablet call with patient to administer bedtime medication. Patient found doing well with no complaints or pain. Patient still has the double vision in left eye and has patch on her glasses lens. RN informed patient on the various ways to call if she has any needs overnight. Patient verbalized understanding and was very appreciative and pleasant.

## 2024-04-20 NOTE — Care Management Important Message (Signed)
 Important Message  Patient Details  Name: Ana Russell MRN: [id number] Date of Birth: 14-Sep-1955   Important Message Given:  Yes - Medicare IM     Claretta Deed 04/20/2024, 3:04 PM

## 2024-04-20 NOTE — Progress Notes (Signed)
 NEUROLOGY CONSULT FOLLOW UP NOTE   Date of service: April 20, 2024 Patient Name: Ana Russell MRN:  [id number] DOB:  [birth year]  Interval Hx/subjective  Patient has been hemodynamically stable and afebrile overnight.  She reports that the numbness to the right side of her face has resolved but continues to have diplopia.  Patient has been performing exercises and reports mild fatigue.  She is tolerating Solu-Medrol  well and has received 3 doses so far; today is day 4/5.   Vitals   Vitals:   04/19/24 1800 04/19/24 1952 04/20/24 0420 04/20/24 0731  BP: (!) 172/84 (!) 150/93 136/71 (!) 161/91  Pulse: 70 70 (!) 59 74  Resp:  17 17   Temp: 97.7 F (36.5 C) 97.7 F (36.5 C) 97.7 F (36.5 C) (!) 97.5 F (36.4 C)  TempSrc: Oral Oral Oral Oral  SpO2: 100% 100% 98% 97%  Weight:      Height:         Body mass index is 19.37 kg/m.  Physical Exam   Constitutional: Appears well-developed and well-nourished.  Psych: Affect appropriate to situation.  Eyes: No scleral injection.  HENT: No OP obstrucion.  Head: Normocephalic.  Respiratory: Effort normal, non-labored breathing.  Skin: WDI.   Neurologic Examination   Neuro: *Ment: A&O x4. Follows multi-step commands.  Able to give clear and coherent history of present illness *Memory intact *CN:    I: Deferred   II,III: PERRLA, visual fields intact, exotropia present when left eye is uncovered, pupillary constriction appropriate   III,IV,VI: EOMI w/o nystagmus, L eye not able to adduct, R eye not able to adduct. With her glasses on, she wears a dark filter on the left sided lens which she states ameliorates her double vision.    V: Sensation intact from V1 to V3 to LT   VII: Eyelid closure was full.  Smile symmetric.   VIII: Hearing intact to voice   IX,X: Phonation normal   XI: SCM/trap 5/5 bilat   XII: Tongue protrudes midline, no atrophy or fasciculations  *Motor:   Normal bulk.  No tremor, rigidity or bradykinesia. No  pronator drift. LUE: 5/5 strength, 5/5 grip strength  RUE: 5/5 strength, 5/5 grip strength  LLE: 5/5 hip flexion, 4/5 knee extension and flexion, 5/5 plantar and dorsiflexion RLE: 5/5 hip flexion, 4/5 knee extension and flexion, 5/5 plantar and dorsiflexion  *Sensory: Intact to light touch, *Coordination: Finger-to-nose intact bilaterally *Gait: Deferred    Medications  Current Facility-Administered Medications:    acetaminophen  (TYLENOL ) tablet 650 mg, 650 mg, Oral, Q6H PRN, Eldonna Elspeth PARAS, MD   alendronate  (FOSAMAX ) tablet 70 mg, 70 mg, Oral, Weekly, Eldonna Elspeth PARAS, MD   enoxaparin  (LOVENOX ) injection 40 mg, 40 mg, Subcutaneous, Q24H, Eldonna Elspeth PARAS, MD   hydrOXYzine  (ATARAX ) tablet 10 mg, 10 mg, Oral, QHS PRN, Eldonna Elspeth PARAS, MD   levothyroxine  (SYNTHROID ) tablet 112 mcg, 112 mcg, Oral, Daily, Eldonna Elspeth PARAS, MD, 112 mcg at 04/20/24 0554   melatonin tablet 3 mg, 3 mg, Oral, QHS PRN, Eldonna Elspeth PARAS, MD   methylPREDNISolone  sodium succinate (SOLU-MEDROL ) 1,000 mg in sodium chloride  0.9 % 50 mL IVPB, 1,000 mg, Intravenous, Q24H, Last Rate: 66 mL/hr at 04/19/24 2145, 1,000 mg at 04/19/24 2145 **AND** [DISCONTINUED] pantoprazole  (PROTONIX ) injection 40 mg, 40 mg, Intravenous, Q24H, Khaliqdina, Salman, MD, 40 mg at 04/19/24 0002   ondansetron  (ZOFRAN ) tablet 4 mg, 4 mg, Oral, Q6H PRN **OR** ondansetron  (ZOFRAN ) injection 4 mg, 4 mg, Intravenous, Q6H PRN, Eldonna Elspeth  J, MD   pantoprazole  (PROTONIX ) EC tablet 40 mg, 40 mg, Oral, BID, Eldonna Elspeth PARAS, MD, 40 mg at 04/20/24 0947  Labs and Diagnostic Imaging   CBC:  Recent Labs  Lab 04/17/24 1208 04/17/24 2306 04/18/24 0509 04/20/24 0452  WBC 5.2   < > 4.4 8.6  NEUTROABS 3.1  --   --   --   HGB 14.0   < > 14.3 13.9  HCT 43.9   < > 43.5 42.1  MCV 93.2   < > 92.4 91.3  PLT 207   < > 199 198   < > = values in this interval not displayed.    Basic Metabolic Panel:  Lab Results  Component Value Date   NA 141 04/20/2024    K 4.9 04/20/2024   CO2 22 04/20/2024   GLUCOSE 162 (H) 04/20/2024   BUN 17 04/20/2024   CREATININE 0.85 04/20/2024   CALCIUM  8.9 04/20/2024   GFRNONAA >60 04/20/2024   GFRAA 74 02/27/2020     CT angio Head and Neck with contrast: 1. No hemorrhage or other acute abnormality 2. No cervical carotid artery stenosis 3. No significant vertebrobasilar or intracranial disease  MRI Brain (Personally reviewed): 1. Numerous new nodular and ring enhancing lesions within the cerebral hemispheres bilaterally, including the lateral ventricles, left inferior frontal white matter, and left posterior temporal white matter. Findings most likely represent acute demyelinating lesions in the setting of exacerbation of  multiple sclerosis. 2. Extensive chronic periventricular white matter disease with a frond-like appearance characteristic of multiple sclerosis. 3. No evidence of hemorrhage or restricted diffusion.    Assessment  Ana Russell is a 68 y.o. female with past medical history of MS, recently taken off of Aubaugio due to her stable disease, admitted for worsening balance and vision, and found to have an acute MS flare. Imaging consistent with multiple new uniformly enhancing in addition to ring enhancing acute demyelinating lesions within the cerebral hemispheres. She still is having double vision and feels off balance from walking.  Patient tolerating Solu-Medrol  well and hopes to receive her last dose at home.  - Exam today as documented above. Ocular motility deficits best localize to the pons.  - Vitamin D  level within the normal range at 56.65 - Impression: Acute MS exacerbation  Recommendations  - Continuing IVMP (Day 4/5) - PT/OT evaluation  - Follow-up with Dr. Vear as an outpatient, recommend to restart disease modifying therapy. - Neurology will continue to follow ______________________________________________________________________  Patient seen by NP and then by  MD, MD to edit note as needed. Cortney E Everitt Clint Kill , MSN, AGACNP-BC [facility] See Amion for schedule and pager information 04/20/2024 10:50 AM   Electronically signed: Dr. Efren Kross

## 2024-04-20 NOTE — Plan of Care (Signed)

## 2024-04-20 NOTE — Progress Notes (Signed)
 Hospital at home progress note    [name]  FMW:[id number] DOB: 1956/01/30 DOA: 04/17/2024 PCP: Clarice Nottingham, MD  Patient was seen virtually at friend's home.  I was located at my home in [place]  [place] . Patient identified as Ana Russell date of birth 12/04/1955.   Brief Narrative:  68 y.o. female with medical history significant of multiple sclerosis, history of previous C. difficile colitis, hypothyroidism, Mnire's disease, osteoporosis, endometrial hyperplasia who follows with neurology Dr. Vear for her multiple sclerosis admitted with MS flare.  Her main symptoms were dizziness, facial numbness,double vision, unsteady gait.  She had a fall about 7 days prior to admission.  She also noticed lateral left eye deviation so she has been patching her left eye.  No focal weakness.  ED consulted neuro recommended admission for MS flareup and high-dose steroids.  MRI brain 04/17/2024 Numerous new nodular and ring enhancing lesions within the cerebral hemispheres bilaterally, including the lateral ventricles, left inferior frontal white matter, and left posterior temporal white matter. Findings most likely represent exacerbation of multiple sclerosis, but clinical correlation is recommended. Extensive periventricular white matter disease with a frond-like appearance characteristic of multiple sclerosis. No evidence of hemorrhage or restricted diffusion.  Assessment & Plan:   Principal Problem:   Multiple sclerosis (HCC) Active Problems:   Hypothyroidism   Vitamin D  deficiency   ANEMIA-IRON DEFICIENCY   Anxiety state   Asthma   Gait difficulty   Hyperlipidemia   #1 MS flare patient admitted with diplopia ataxia unsteady gait, had a fall 5 days prior to admission.  Neurology recommends Solu-Medrol  1 g IV daily for 5 days.last dose today. Follow-up with Dr. Vear with [facility] neurology as an outpatient to reconsider biological agent for MS. On PPI for prophylaxis MRI  findings as above. Patient had concerns about if her husband  may have poisoning her and is trying to kill her and reports that she had the symptoms even before steroids were given.  Will reconsult psych and monitor her.  This could have be due to steroid induced psychosis.  #2 hypothyroidism on Synthroid  last TSH 1.907 from this admission  #3 anxiety/insomnia as needed Atarax  was started this admission  #4 malnutrition BMI of 19  #5 vitamin D  deficiency levels within normal limits continue replacement Level 56.65  #6 history of asthma stable  #7 anemia of chronic disease stable hemoglobin stable at 13.9  #8 hyperlipidemia on statin (unclear if she is taking it at home) follow-up lipid levels today  #9 osteoporosis on Fosamax   #10 steroid-induced hyperglycemia blood sugars in the 160 range    Estimated body mass index is 19.37 kg/m as calculated from the following:   Height as of this encounter: 5' 6 (1.676 m).   Weight as of this encounter: 54.4 kg.  DVT prophylaxis: SCD Code Status:FULL Family Communication: none Disposition Plan:  Status is: Inpatient Remains inpatient appropriate because: acute illness   Consultants:  neuro  Procedures:none Antimicrobials:none  Subjective:  She is sitting up in chair she reports she has done some walking inside the house does feel dizzy occasionally.  Feels the numbness on the face is decreasing.  Right side has improved than the left side of her face. Objective: Vitals:   04/19/24 1800 04/19/24 1952 04/20/24 0420 04/20/24 0731  BP: (!) 172/84 (!) 150/93 136/71 (!) 161/91  Pulse: 70 70 (!) 59 74  Resp:  17 17   Temp: 97.7 F (36.5 C) 97.7 F (36.5 C) 97.7 F (36.5 C) (!)  97.5 F (36.4 C)  TempSrc: Oral Oral Oral Oral  SpO2: 100% 100% 98% 97%  Weight:      Height:       No intake or output data in the 24 hours ending 04/20/24 1803 Filed Weights   04/17/24 1130  Weight: 54.4 kg    Examination: Physical exam by  paramedic Richerd Eastern.  General exam: Does not appear to be in any distress. Respiratory system: Clear to auscultation. Respiratory effort normal. Cardiovascular system: Regular  gastrointestinal system: Abdomen is nondistended, soft and nontender. No organomegaly or masses felt. Normal bowel sounds heard. Central nervous system: Alert and oriented.  Extremities: No edema   Data Reviewed: I have personally reviewed following labs and imaging studies  CBC: Recent Labs  Lab 04/17/24 1208 04/17/24 2306 04/18/24 0509 04/20/24 0452  WBC 5.2 5.8 4.4 8.6  NEUTROABS 3.1  --   --   --   HGB 14.0 15.0 14.3 13.9  HCT 43.9 45.3 43.5 42.1  MCV 93.2 91.3 92.4 91.3  PLT 207 211 199 198   Basic Metabolic Panel: Recent Labs  Lab 04/17/24 1208 04/17/24 2306 04/18/24 0509 04/20/24 0452  NA 141  --  140 141  K 4.3  --  3.5 4.9  CL 104  --  102 109  CO2 27  --  24 22  GLUCOSE 113*  --  150* 162*  BUN 13  --  12 17  CREATININE 0.75 0.88 1.06* 0.85  CALCIUM  9.8  --  9.2 8.9   GFR: Estimated Creatinine Clearance: 53.6 mL/min (by C-G formula based on SCr of 0.85 mg/dL). Liver Function Tests: Recent Labs  Lab 04/18/24 0509 04/20/24 0452  AST 22 30  ALT 19 19  ALKPHOS 39 33*  BILITOT 0.9 1.0  PROT 6.6 6.2*  ALBUMIN 3.8 3.7   No results for input(s): LIPASE, AMYLASE in the last 168 hours. No results for input(s): AMMONIA in the last 168 hours. Coagulation Profile: No results for input(s): INR, PROTIME in the last 168 hours. Cardiac Enzymes: No results for input(s): CKTOTAL, CKMB, CKMBINDEX, TROPONINI in the last 168 hours. BNP (last 3 results) No results for input(s): PROBNP in the last 8760 hours. HbA1C: No results for input(s): HGBA1C in the last 72 hours. CBG: Recent Labs  Lab 04/17/24 1145  GLUCAP 127*   Lipid Profile: No results for input(s): CHOL, HDL, LDLCALC, TRIG, CHOLHDL, LDLDIRECT in the last 72 hours. Thyroid  Function  Tests: No results for input(s): TSH, T4TOTAL, FREET4, T3FREE, THYROIDAB in the last 72 hours. Anemia Panel: No results for input(s): VITAMINB12, FOLATE, FERRITIN, TIBC, IRON, RETICCTPCT in the last 72 hours. Sepsis Labs: No results for input(s): PROCALCITON, LATICACIDVEN in the last 168 hours.  No results found for this or any previous visit (from the past 240 hours).       Radiology Studies: No results found.      Scheduled Meds:  alendronate   70 mg Oral Weekly   feeding supplement  237 mL Oral TID BM   levothyroxine   112 mcg Oral Daily   multivitamin with minerals  1 tablet Oral Daily   pantoprazole   40 mg Oral BID   Continuous Infusions:  methylPREDNISolone  (SOLU-MEDROL ) injection 1,000 mg (04/20/24 1653)     LOS: 3 days   Almarie KANDICE Hoots, MD 04/20/2024, 6:03 PM

## 2024-04-20 NOTE — Progress Notes (Signed)
 [facility]   ROUNDING   NOTE Ana Russell FMW:[id number]  DOB: 14-Nov-1955  DOA: 04/17/2024  PCP: Clarice Nottingham, MD  04/20/2024,10:22 AM  LOS: 3 days    Code Status: Full code     from: Home current Dispo: Unclear    68 year old white female Multiple sclerosis diagnosed 1991-on [address] RD 2009 followed by Dr. Hansel hospitalized by neurology in 2009 for MS flare Not on Aubagio  2/2 hair loss, alopecia 1 month H/o diplopia ataxia disconjugate gaze Workup revealed in ED ring-enhancing cerebral hemisphere lesions-no lesions on MRI from 02/2024  Neurohospitalist consulted Started on steroids etc.   Plan  MS flare Continues Solu-Medrol  every 24 pantoprazole  40 every 24 which I will change to p.o. Diplopia continues although the last time she had a significant MS flare it persisted for 3 weeks even post 5 days of steroids She is already called her neurologist Dr. Vear who will see her in the outpatient setting and reconsider a biological agent for control of the same Yeah so then did this happen after the surgery that she had on her leg Patient seen ambulating in the hall --a great candidate for hospital at home program  Impaired glucose tolerance CBGs remain below 180 Would not check CBGs unless above 180 and if that occurs can start oral glipizide in the outpatient setting  Hypothyroid Continue 112 Synthroid  outpatient reeval with TFT in about 3 weeks  Underlying?  Anxiety insomnia Continue Atarax  10 at bedtime as needed  BMI 19 with moderate malnutrition as evidenced by bitemporal wasting Supplement etc.     Data Reviewed:   Sodium 141 potassium 4.9 BUN/creatinine 17/0.8 WBC 8.6 hemoglobin 13.9 platelet 198  DVT prophylaxis: Lovenox   Status is: Inpatient Remains inpatient appropriate because:   Awaiting decision re:-hospital at home     Subjective:  Awake coherent no distress looks fair feels well overall Walked the halls and feels tired after that still has  visual discrepancies but tells me it took 3 weeks the last time she had an event for it to completely resolve Tells me that perioral sensation has returned on both sides and sensation is better bilaterally in lower and upper extremities  Objective + exam Vitals:   04/19/24 1800 04/19/24 1952 04/20/24 0420 04/20/24 0731  BP: (!) 172/84 (!) 150/93 136/71 (!) 161/91  Pulse: 70 70 (!) 59 74  Resp:  17 17   Temp: 97.7 F (36.5 C) 97.7 F (36.5 C) 97.7 F (36.5 C) (!) 97.5 F (36.4 C)  TempSrc: Oral Oral Oral Oral  SpO2: 100% 100% 98% 97%  Weight:      Height:       Filed Weights   04/17/24 1130  Weight: 54.4 kg   Examination:  Sensory is intact bilaterally intact to fine touch Chest is clear no wheeze Power is 5/5 Reflexes deferred Patient by direct confrontation remains intact although some diplopia Neck soft supple shoulder raise bilaterally is equal  Scheduled Meds:  alendronate   70 mg Oral Weekly   enoxaparin  (LOVENOX ) injection  40 mg Subcutaneous Q24H   levothyroxine   112 mcg Oral Daily   pantoprazole   40 mg Oral BID   Continuous Infusions:  methylPREDNISolone  (SOLU-MEDROL ) injection 1,000 mg (04/19/24 2145)    Time 20  Colen Grimes, MD  [facility]

## 2024-04-20 NOTE — Plan of Care (Signed)
 [facility] Progress/Plan of Care Note    Ana Russell   FMW:[id number]  DOB: 11-30-1955  DOA: 04/17/2024     3 Date of Service: 04/20/2024     Subjective:  68 year old white female Multiple sclerosis diagnosed 1991-on DMA RD 2009 followed by Dr. Hansel hospitalized by neurology in 2009 for MS flare Not on Aubagio  2/2 hair loss, alopecia 1 month H/o diplopia ataxia disconjugate gaze Workup revealed in ED ring-enhancing cerebral hemisphere lesions-no lesions on MRI from 02/2024. Neurohospitalist consulted Started on steroids- Last day will be 04/21/24.   Overall weakness much improved. Has been able ambulate to nursing station 600+ feet multiple times using cane without issue. Dizziness improved. L eye blurry vision improved.    Hospital Problems Assessment and Plan: MS flare Continues Solu-Medrol  every 24 pantoprazole  40 every 24 which I will change to p.o. Symptomatically improving  Per report, pt has contacted outpatient neurologist  Dr. Vear who will see her in the outpatient setting and reconsider a biological agent for control of the same Dr. Lindzen will plan to formally assess patient via telemedicine link on 8/16 for symptomatic improvement in setting of hospital at home status.  Solumedrol end date 04/21/24    Impaired glucose tolerance CBGs remain below 180 Would not check CBGs unless above 180 and if that occurs can start oral glipizide in the outpatient setting   Hypothyroid Continue 112 Synthroid  outpatient reeval with TFT in about 3 weeks   Underlying?  Anxiety insomnia Continue Atarax  10 at bedtime as needed   BMI 19 with moderate malnutrition as evidenced by bitemporal wasting Supplement etc. Check prealbumin  Nutrition consult  Ensure supplementation TID       Objective Vital signs were reviewed and unremarkable. Vitals:   04/19/24 1800 04/19/24 1952 04/20/24 0420 04/20/24 0731  BP: (!) 172/84 (!) 150/93 136/71 (!) 161/91   Pulse: 70 70 (!) 59 74  Temp: 97.7 F (36.5 C) 97.7 F (36.5 C) 97.7 F (36.5 C) (!) 97.5 F (36.4 C)  Resp:  17 17   Height:      Weight:      SpO2: 100% 100% 98% 97%  TempSrc: Oral Oral Oral Oral  BMI (Calculated):        Exam Physical Exam Constitutional:      Comments: Thin    HENT:     Head: Normocephalic and atraumatic.     Nose: Nose normal.     Mouth/Throat:     Mouth: Mucous membranes are moist.  Eyes:     Pupils: Pupils are equal, round, and reactive to light.  Cardiovascular:     Rate and Rhythm: Normal rate and regular rhythm.  Pulmonary:     Effort: Pulmonary effort is normal.  Abdominal:     General: Bowel sounds are normal.  Musculoskeletal:        General: Normal range of motion.  Neurological:     General: No focal deficit present.  Psychiatric:        Mood and Affect: Mood normal.      Labs / Other Information There are no new results to review at this time.  CT ANGIO HEAD NECK W WO CM CLINICAL DATA:  Fall, visual disturbance  EXAM: CT ANGIOGRAPHY HEAD AND NECK WITH AND WITHOUT CONTRAST  TECHNIQUE: Multidetector CT imaging of the head and neck was performed using the standard protocol during bolus administration of intravenous contrast. Multiplanar CT image reconstructions and MIPs were obtained to evaluate the vascular anatomy. Carotid  stenosis measurements (when applicable) are obtained utilizing NASCET criteria, using the distal internal carotid diameter as the denominator.  RADIATION DOSE REDUCTION: This exam was performed according to the departmental dose-optimization program which includes automated exposure control, adjustment of the mA and/or kV according to patient size and/or use of iterative reconstruction technique.  CONTRAST:  75mL OMNIPAQUE  IOHEXOL  350 MG/ML SOLN  COMPARISON:  None Available.  FINDINGS: CT HEAD:  There are areas of low attenuation in the cerebral white matter.  There is no hemorrhage.  No  acute ischemic changes.  No mass lesion.  The ventricles are normal.  Skull/sinuses/orbits:  No significant abnormality.  CTA NECK: CTA NECK Aortic arch: No proximal vessel stenosis.  Right carotid: Normal  Left carotid: Normal  Right vertebral: Normal  Left vertebral: Normal  Soft tissues: No significant abnormality  Other comments: None  CTA HEAD: CTA HEAD  Right anterior circulation: The internal carotid artery is patent without significant stenosis. The anterior and middle cerebral arteries are patent without significant stenosis or proximal branch occlusion. No aneurysm.  Left anterior circulation: The internal carotid artery is patent without significant stenosis. The anterior and middle cerebral arteries are patent without significant stenosis or proximal branch occlusion. No aneurysm.  Posterior circulation: Both vertebral arteries are patent. There is no significant basilar stenosis. Both posterior cerebral arteries are patent without significant stenosis or proximal branch occlusion. No aneurysm.  IMPRESSION: 1. No hemorrhage or other acute abnormality 2. No cervical carotid artery stenosis 3. No significant vertebrobasilar or intracranial disease  Electronically Signed   By: Nancyann Burns M.D.   On: 04/17/2024 15:15 MR Brain W and Wo Contrast EXAM: MRI BRAIN WITH AND WITHOUT CONTRAST 04/17/2024 02:42:50 PM  TECHNIQUE: Multiplanar multisequence MRI of the head/brain was performed with and without the administration of intravenous contrast.  COMPARISON: MRI of the head dated 02/23/2024.  CLINICAL HISTORY: Neuro deficit, acute, stroke suspected.  FINDINGS:  BRAIN AND VENTRICLES: There are numerous new nodular and ring enhancing lesions present within the cerebral hemispheres bilaterally. There is a frond-like lesion along the body of the right lateral ventricle at the frontoparietal junction. There are nodular, appendable, and  subependymal lesions along the posterior horn of the left lateral ventricle. There are ring enhancing lesions along the lateral surface of the occipital horn of the left lateral ventricle. There is a nodular enhancing lesion adjacent to the right occipital horn. There is a ring enhancing lesion present medially within the left inferior frontal white matter. There is a ring enhancing lesion within the left posterior temporal white matter. There is also a ring enhancing lesion lateral to the temporal horn of the right lateral ventricle. On the FLAIR sequence, there is extensive periventricular white matter disease, with a frond-like appearance characteristic of multiple sclerosis. There is no evidence of hemorrhage. There are numerous foci of mildly increased signal on the diffusion-weighted images, compatible with facilitated diffusion. There is no restricted diffusion evident.  ORBITS: No acute abnormality.  SINUSES: No acute abnormality.  BONES AND SOFT TISSUES: Normal bone marrow signal and enhancement. No acute soft tissue abnormality.  IMPRESSION: 1. Numerous new nodular and ring enhancing lesions within the cerebral hemispheres bilaterally, including the lateral ventricles, left inferior frontal white matter, and left posterior temporal white matter. Findings most likely represent exacerbation of multiple sclerosis, but clinical correlation is recommended. 2. Extensive periventricular white matter disease with a frond-like appearance characteristic of multiple sclerosis. 3. No evidence of hemorrhage or restricted diffusion.  Electronically signed by: timothy  berrigan 04/17/2024 03:14 PM EDT RP Workstation: [id number] MR Cervical Spine W and Wo Contrast CLINICAL DATA:  Ataxia. Nontraumatic. Cervical pathology suspected.  EXAM: MRI CERVICAL SPINE WITHOUT AND WITH CONTRAST  TECHNIQUE: Multiplanar and multiecho pulse sequences of the cervical spine, to include the  craniocervical junction and cervicothoracic junction, were obtained without and with intravenous contrast.  CONTRAST:  5mL GADAVIST  GADOBUTROL  1 MMOL/ML IV SOLN  COMPARISON:  02/23/2024  FINDINGS: Alignment: Normal  Vertebrae: No fracture or focal bone lesion.  Cord: No cord compression or focal cord lesion. Ample subarachnoid space surrounds the cord. No abnormal contrast enhancement of the cord. Abnormal enhancement is noted along the floor the fourth ventricle at the dorsal pontomedullary junction. Question abnormal T2 signal in the midbrain. Findings could be seen with demyelinating disease. See results of brain MRI.  Posterior Fossa, vertebral arteries, paraspinal tissues: See above.  Disc levels:  No significant finding from the foramen magnum through C4-5.  C5-6: Mild spondylosis.  Mild right foraminal narrowing.  C6-7: Mild spondylosis.  No stenosis.  C7-T1: Normal  IMPRESSION: 1. No cord compression or focal cord lesion. No abnormal contrast enhancement of the cord. 2. Abnormal enhancement along the floor of the fourth ventricle at the dorsal pontomedullary junction. Question abnormal T2 signal in the midbrain. Findings could be seen with demyelinating disease. See results of brain MRI. 3. Mild spondylosis at C5-6 and C6-7. Mild right foraminal narrowing at C5-6.  Electronically Signed   By: Oneil Officer M.D.   On: 04/17/2024 15:11  Lab Results  Component Value Date   WBC 8.6 04/20/2024   HGB 13.9 04/20/2024   HCT 42.1 04/20/2024   MCV 91.3 04/20/2024   PLT 198 04/20/2024   Last metabolic panel Lab Results  Component Value Date   GLUCOSE 162 (H) 04/20/2024   NA 141 04/20/2024   K 4.9 04/20/2024   CL 109 04/20/2024   CO2 22 04/20/2024   BUN 17 04/20/2024   CREATININE 0.85 04/20/2024   GFRNONAA >60 04/20/2024   CALCIUM  8.9 04/20/2024   PROT 6.2 (L) 04/20/2024   ALBUMIN 3.7 04/20/2024   LABGLOB 2.1 12/04/2020   AGRATIO 2.2 12/04/2020    BILITOT 1.0 04/20/2024   ALKPHOS 33 (L) 04/20/2024   AST 30 04/20/2024   ALT 19 04/20/2024   ANIONGAP 10 04/20/2024     Time spent: >60 minutes  [facility] 04/20/2024, 1:11 PM

## 2024-04-21 LAB — CBC
HCT: 45.6 % (ref 36.0–46.0)
Hemoglobin: 14.8 g/dL (ref 12.0–15.0)
MCH: 29.6 pg (ref 26.0–34.0)
MCHC: 32.5 g/dL (ref 30.0–36.0)
MCV: 91.2 fL (ref 80.0–100.0)
Platelets: 222 K/uL (ref 150–400)
RBC: 5 MIL/uL (ref 3.87–5.11)
RDW: 13.2 % (ref 11.5–15.5)
WBC: 9.5 K/uL (ref 4.0–10.5)
nRBC: 0 % (ref 0.0–0.2)

## 2024-04-21 LAB — COMPREHENSIVE METABOLIC PANEL WITH GFR
ALT: 22 U/L (ref 0–44)
AST: 23 U/L (ref 15–41)
Albumin: 4.5 g/dL (ref 3.5–5.0)
Alkaline Phosphatase: 40 U/L (ref 38–126)
Anion gap: 10 (ref 5–15)
BUN: 18 mg/dL (ref 8–23)
CO2: 26 mmol/L (ref 22–32)
Calcium: 9.4 mg/dL (ref 8.9–10.3)
Chloride: 106 mmol/L (ref 98–111)
Creatinine, Ser: 0.9 mg/dL (ref 0.44–1.00)
GFR, Estimated: >60 mL/min (ref >60)
Glucose, Bld: 117 mg/dL — ABNORMAL HIGH (ref 70–99)
Potassium: 3.8 mmol/L (ref 3.5–5.1)
Sodium: 142 mmol/L (ref 135–145)
Total Bilirubin: 0.7 mg/dL (ref 0.0–1.2)
Total Protein: 7.5 g/dL (ref 6.5–8.1)

## 2024-04-21 LAB — LIPID PANEL
Cholesterol: 240 mg/dL — ABNORMAL HIGH (ref 0–200)
HDL: 110 mg/dL (ref >40)
LDL Cholesterol: 112 mg/dL — ABNORMAL HIGH (ref 0–99)
Total CHOL/HDL Ratio: 2.2 ratio
Triglycerides: 90 mg/dL (ref <150)
VLDL: 18 mg/dL (ref 0–40)

## 2024-04-21 MED ORDER — ROSUVASTATIN CALCIUM 5 MG PO TABS
5.0000 mg | ORAL_TABLET | Freq: Every day | ORAL | Status: DC
Start: 2024-04-21 — End: 2024-04-21
  Filled 2024-04-21 (×2): qty 1

## 2024-04-21 MED ORDER — VITAMIN D 25 MCG (1000 UNIT) PO TABS
2000.0000 [IU] | ORAL_TABLET | Freq: Every day | ORAL | Status: DC
  Administered 2024-04-21 – 2024-04-22 (×2): 2000 [IU] via ORAL
  Filled 2024-04-21 (×4): qty 2

## 2024-04-21 NOTE — Progress Notes (Signed)
 Virtual rounding with RN and MD Will completed with paramedic Sarah at the patient home. Pt aware of POC and agreeable.
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
## 2024-04-21 NOTE — Plan of Care (Signed)
   Problem: Education: Goal: Knowledge of General Education information will improve Description: Including pain rating scale, medication(s)/side effects and non-pharmacologic comfort measures Outcome: Progressing   Problem: Health Behavior/Discharge Planning: Goal: Ability to manage health-related needs will improve Outcome: Progressing   Problem: Clinical Measurements: Goal: Ability to maintain clinical measurements within normal limits will improve Outcome: Progressing Goal: Will remain free from infection Outcome: Progressing Goal: Diagnostic test results will improve Outcome: Progressing   Problem: Nutrition: Goal: Adequate nutrition will be maintained Outcome: Progressing   Problem: Coping: Goal: Level of anxiety will decrease Outcome: Progressing   Problem: Safety: Goal: Ability to remain free from injury will improve Outcome: Progressing

## 2024-04-21 NOTE — Plan of Care (Addendum)
  Problem: Education: Goal: Knowledge of General Education information will improve Description: Including pain rating scale, medication(s)/side effects and non-pharmacologic comfort measures Outcome: Progressing   Problem: Safety: Goal: Ability to remain free from injury will improve Outcome: Progressing   Problem: Clinical Measurements: Goal: Ability to maintain clinical measurements within normal limits will improve Outcome: Progressing Goal: Will remain free from infection Outcome: Progressing Goal: Diagnostic test results will improve Outcome: Progressing Goal: Respiratory complications will improve Outcome: Progressing Goal: Cardiovascular complication will be avoided Outcome: Progressing

## 2024-04-21 NOTE — Progress Notes (Signed)
 Spoke with patient at 1200 to update on new time for paramedic visit. Pt voices understanding.

## 2024-04-21 NOTE — Progress Notes (Addendum)
 2007- RN introduction, Patient identifiers completed. Patient alert and oriented. Plan for  assessment, overnight monitoring, medication administration assistance, [facility] contact information reviewed. Patient agreed to a video call for scheduled 2100 for medication assistance and assessment.  Patient denies pain or changes in condition needing immediate care. Patient encouraged to contact [facility] as needed.   2100- Patient contact via Video call. Patient identifiers review  patient A&O x4. Patient sitting at table. Patients denies pain, discomfort, issues with IV access, worsening vision, balance, or health concerns. Assessment and medication administration assistance completed. Overnight monitoring plan and [facility] contact information, when to call RN or for emergency needs per Code Status, and plan for AM medications reviewed. Per patient no other questions or concerns at the time of video call. Patient agreed to contact call between 0615-0630 for AM medication. Patient encouraged to call as needed and will continue to be monitored via current health monitoring.

## 2024-04-21 NOTE — Progress Notes (Signed)
 1645 virtual visit with neuro Dr. Merrianne completed. Paramedic Sarah at pt's side to assist with neuro assessment and RN on camera.   After visit with neuro, pt updated on plan to keep til Monday and await psch. Consult.

## 2024-04-21 NOTE — Progress Notes (Signed)
 IMM letter given on 04/20/24
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
## 2024-04-21 NOTE — Progress Notes (Signed)
 Paramedic Bryce Cheever transported the PT from the hospital to the independent living facility.   PT ambulated with assistance with a cane to her apartment without incident. PT stated that she feels unsteady some but is able to gain her balance quickly. PT denied being in any pain.   All equipment was brought into the house and set up without incident. Education was given to the PT on the equipment, medication system, as well as how to contact the RN. PT wrote down all information to remember for later and did the teach back method. Meals were placed in the PT's kitchen and ensure drinks were placed in her refrigerator. Safety Ax was completed and found no tripping hazards. PT is able to get in and out of the shower easily, small ledge to get in but low to the ground. Apartment is well lit and and clean. PT will be staying by herself for the night and stated that her sister would be coming from out of town to visit her.   PT's vitals were obtained and documented accordingly. IV steroids were administered. Skin Ax was performed with Paramedic Hyacinth and RN Jonne.   Once steroids were completed, RN Jonne d/c the medication and flushed the IV line. PT was reminded that if she needed anything to call the virtual RN.
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
## 2024-04-21 NOTE — Consult Note (Signed)
 Psychiastric consult service was consulted for paranoia about this 68 year old female with medical history of multiple sclerosis, currently receiving IV steroid. However, patient is not currently physically present at [facility] but they are in their home receiving (hospital at home treatment). Meanwhile, psychiatric consult service does not have the resources to communicate with patient at home via tele health, but the treating provider was informed to send the patient to the emergency room if their current symptoms are posing a greater risk of harm to them and others. This information was related to the treating provider, Ana Hoots, MD.    Ana Donath, MD.

## 2024-04-22 DIAGNOSIS — F19950 Other psychoactive substance use, unspecified with psychoactive substance-induced psychotic disorder with delusions: Secondary | ICD-10-CM

## 2024-04-22 LAB — CBG MONITORING, ED: 11800305105: 126

## 2024-04-22 MED ORDER — ROSUVASTATIN CALCIUM 5 MG PO TABS
5.0000 mg | ORAL_TABLET | Freq: Every day | ORAL | Status: DC
  Administered 2024-04-22: 5 mg via ORAL
  Filled 2024-04-22 (×3): qty 1

## 2024-04-22 MED ORDER — AMLODIPINE BESYLATE 2.5 MG PO TABS
2.5000 mg | ORAL_TABLET | Freq: Once | ORAL | Status: AC
  Administered 2024-04-22: 2.5 mg via ORAL
  Filled 2024-04-22: qty 1

## 2024-04-22 MED ORDER — MELATONIN 5 MG PO TABS
5.0000 mg | ORAL_TABLET | Freq: Every evening | ORAL | Status: DC | PRN
  Filled 2024-04-22: qty 1

## 2024-04-22 NOTE — Progress Notes (Addendum)
 1150 virtual call with Mental health provider. Orders put in for higher dose of melatonin for sleep.  Patient BG 126 finger stick from medic.
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
## 2024-04-22 NOTE — Progress Notes (Signed)
 1445 virtual call to patient she was taking a nap. She reports feeling well, was informed psych cleared her for discharge and the provider spoke with spouse as well. Patient signing IMM asked her if she will be staying at [facility]. States she thinks husband is going to pick her up on discharge.
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
## 2024-04-22 NOTE — Progress Notes (Signed)
 Video contact call completed. Medication self-administration support provided, no additional needs needed. Patient had no alerts or night, no questions or concerns noted. Patient will continue to be monitored.

## 2024-04-22 NOTE — Progress Notes (Signed)
 1000 Virtual visit with provider, Charity fundraiser, and Tax inspector. Patient reports feeing well. Denies N/V or diarrhea. Was able to eat breakfast. Provider putting patient back on crestor  r/t high LDL. No leg swelling. Dr is discussing elevated BP 159/93 HR 63.

## 2024-04-22 NOTE — Progress Notes (Signed)
 1810 AFTERNOON PATIENT VISIT - Medic at [facility] patient cleared for discharge. As of this hour refuses to sign IMM. Medic Provider, and EMT were present patient is still on for discharge in the am.

## 2024-04-22 NOTE — Progress Notes (Signed)
 1912--Inbound call received, patient reported here Current health wearable was removed from the home and was unable to find it. Patient informed RN would gather some additional information from the team and return a call for support. The patient was not in distress or expressed need for immediate support.       1931- Outbound call contact. RN introduction, Patient identifiers completed. Patient alert and oriented. Plan for assessment, overnight monitoring, medication administration assistance, [facility] contact information reviewed. Patient agreed to a video call scheduled now for administration of supplement and assistance with finding and reconnecting wearable.         74- Patient contact via Video call. Patient identifiers review  patient A&O x4. Patient sitting at table.  Patient agreed to taking scheduled supplement first. Patients denies pain, discomfort, issues with IV access, worsening symptoms due to condition.  Patient assisted with finding current health wearable device senor, she reported she was not able find in the home. Device located, patient assisted with placing wearable back on. Data is captured via current health dashboard. Patient reported concern with taking new blood pressure medication. Education provided. Blood pressure was also obtained during video call with patient's blood pressure cuff.  Assessment completed, upcoming medications reviewed. Patient agreed to contact call at 2100 for night medications. Patient encouraged to contact [facility] as needed.

## 2024-04-22 NOTE — Progress Notes (Signed)
 0900 phone call to check in with patient and let her know medic is on the way.

## 2024-04-22 NOTE — Progress Notes (Signed)
 This was for the patietns AM visit.   Arrived at the patietnts residence which was a indipendeat living facility. Minor delays in making patient contact due to safety requirments for access per the facility  Upon patient contact, found the patient stading in the dining room, walking with normal gait setting up for appoitment. The patient voices no complaints and is in no signs of distress. ABCs are intact. The patient is Aox4.   The patient reports that she is getting better. The patient also was provided to [facility] letter and stated it would need to be approved by husband.   Virtual Visit was facilitated and no further needs were requested.

## 2024-04-22 NOTE — Progress Notes (Signed)
 2100- Patient contact via Video call. No answer.    2101 Inbound call from patient requesting assistance with answering video call. Patient assisted.      2103-- Patient contact via Video call. Patient identifiers review  medication administration assistance completed. Overnight monitoring plan and [facility] contact information, when to call RN or for emergency needs per Code Status, and plan for AM medications reviewed. Patient reminded of holding meal until 30 mins after Synthroid  dose. Per patient no other questions or concerns at the time of video call. Patient agreed to contact call between 0615-0630 for AM medication. Patient reported heading to bed. Patient encouraged to call as needed and informed current health data will continue to be monitored. Patient still denies changes in condition or need for any emergent care.

## 2024-04-22 NOTE — Plan of Care (Signed)
  Problem: Education: Goal: Knowledge of General Education information will improve Description: Including pain rating scale, medication(s)/side effects and non-pharmacologic comfort measures Outcome: Progressing   Problem: Clinical Measurements: Goal: Ability to maintain clinical measurements within normal limits will improve Outcome: Progressing   Problem: Clinical Measurements: Goal: Will remain free from infection Outcome: Progressing   Problem: Elimination: Goal: Will not experience complications related to bowel motility Outcome: Progressing Goal: Will not experience complications related to urinary retention Outcome: Progressing   Problem: Nutrition: Goal: Adequate nutrition will be maintained Outcome: Progressing   Problem: Safety: Goal: Ability to remain free from injury will improve Outcome: Progressing

## 2024-04-22 NOTE — TOC Progression Note (Signed)
 Transition of Care [facility]) - Progression Note    Patient Details  Name: Ana Russell MRN: [id number] Date of Birth: 1955-12-23  Transition of Care [facility]) CM/SW Contact  Corean JAYSON Canary, RN Phone Number: 04/22/2024, 6:11 PM  Clinical Narrative:    Discussed in ID rounds. Patient does not need OP PT at this time. She is ambulating baseline with cane according to paramedic.  It was decided that she can follow up after discharge with her PCP regarding further OP therapies.                       Expected Discharge Plan and Services                                               Social Drivers of Health (SDOH) Interventions SDOH Screenings   Food Insecurity: No Food Insecurity (04/17/2024)  Housing: Low Risk  (04/17/2024)  Transportation Needs: No Transportation Needs (04/17/2024)  Utilities: Not At Risk (04/17/2024)  Depression (PHQ2-9): Low Risk  (06/14/2023)  Social Connections: Unknown (04/18/2024)  Tobacco Use: Medium Risk (04/17/2024)   Received from [facility]    Readmission Risk Interventions     No data to display

## 2024-04-23 DIAGNOSIS — F411 Generalized anxiety disorder: Secondary | ICD-10-CM

## 2024-04-23 DIAGNOSIS — R269 Unspecified abnormalities of gait and mobility: Secondary | ICD-10-CM

## 2024-04-23 DIAGNOSIS — J4520 Mild intermittent asthma, uncomplicated: Secondary | ICD-10-CM

## 2024-04-23 DIAGNOSIS — E039 Hypothyroidism, unspecified: Secondary | ICD-10-CM

## 2024-04-23 MED ORDER — POLYETHYLENE GLYCOL 3350 17 G PO PACK
17.0000 g | PACK | Freq: Once | ORAL | Status: DC
  Filled 2024-04-23: qty 1

## 2024-04-23 NOTE — Assessment & Plan Note (Signed)
 04-23-2024 stable.

## 2024-04-23 NOTE — Subjective & Objective (Addendum)
 Pt seen via video visit today. Paramedics with [facility]. Vision is not any worse today. Has appointment to see her neurologist tomorrow 04-24-2024 with Dr. Vear.

## 2024-04-23 NOTE — Assessment & Plan Note (Signed)
 04-23-2024 pt had been assessed by PT/OT prior to DC. Paramedic states that pt is ambulatory at her apartment at [facility] independent living.

## 2024-04-23 NOTE — Assessment & Plan Note (Addendum)
 04-23-2024 pt completed 5 days of high dose IV solumedrol on 04-21-2024. Pt has f/u appointment to see Dr. Vear on 04-24-2024 @ 11 AM. Discussed that she would need to be [facility] in order to go to her outpatient neurology appointment. She is agreeable to DC from [facility] today. Discussed not treating her slightly elevated BP at this time due to her recent high dose steroids. Discussed that overtreatment of BP could result in hypotension as the steroid effects wear off. Recommend that she f/u with her PCP in 1 week if her BP is still elevated. Pt states she has never had an issue with high blood pressure before.

## 2024-04-23 NOTE — Hospital Course (Addendum)
 CC: fall, double vision HPI:  Ana Russell is a 68 y.o. female with medical history significant of multiple sclerosis, history of previous C. difficile colitis, hypothyroidism, Mnire's disease, osteoporosis, endometrial hyperplasia who follows with [facility] neurology Dr. Vear for her multiple sclerosis coming to the ER due to worsening vision changes.  Patient has history of GERD abnormalities but this has gotten worse lately.  She was unsteady on her feet.  She had a fall about 5 days ago after dizziness.  Since then her vision has become double.  Patient was post to follow-up with a neuro-ophthalmologist at [facility] but has not received a call back for follow-up.  Both eyes are showing problems with the vision.  She also noted lateral left eye deviation so she has been patching her eye intermittently in order to have her vision.  No other focal weakness.  In the ER patient was seen at evaluated.  Neurology consulted and recommended admission for MS flare up and high dose steroids.  Patient is hemodynamically stable with no new complaint.    Significant Events: Admitted 04/17/2024 for acute MS flare 04-17-2024 started on IV solumedrol 1000 mg daily x 5 days 04-20-2024 admitted to [facility] program 04-21-2024 Completed IV solumedrol 04-22-2024 telepsych consult performed due to paranoia. Felt that her acute psych issues were due to steroids. Pt did not qualify for acute inpatient psych treatment.  Admission Labs: WBC 5.2, HgB 14, plt 207 Na 141, K 4.3, CO2 of 27, BUN 13, scr 0.75, glu 113 TSH 1.907, FT4 1.43  Admission Imaging Studies: MRI brain Numerous new nodular and ring enhancing lesions within the cerebral hemispheres bilaterally, including the lateral ventricles, left inferior frontal white matter, and left posterior temporal white matter. Findings most likely represent exacerbation of multiple sclerosis, but clinical correlation is recommended. 2. Extensive periventricular  white matter disease with a frond-like appearance characteristic of multiple sclerosis. 3. No evidence of hemorrhage or restricted diffusion. MRI c-spine No cord compression or focal cord lesion. No abnormal contrast enhancement of the cord. 2. Abnormal enhancement along the floor of the fourth ventricle at the dorsal pontomedullary junction. Question abnormal T2 signal in the midbrain. Findings could be seen with demyelinating disease. See results of brain MRI. 3. Mild spondylosis at C5-6 and C6-7. Mild right foraminal narrowing at C5-6. CTA head/neck No hemorrhage or other acute abnormality 2. No cervical carotid artery stenosis 3. No significant vertebrobasilar or intracranial disease  Significant Labs:   Significant Imaging Studies:   Antibiotic Therapy: Anti-infectives (From admission, onward)    None       Procedures:   Consultants: neurology

## 2024-04-23 NOTE — Progress Notes (Signed)
 9089 call from patient asking if she should have her brother bring her stuff confirmed it  was miralax  she takes for her constipation. I educated that while on program patients know they will take only program prescribed medications. Contacted Provider who put in orders and medication delivered to patient by EMT. Facility contacted to notify patient is medically cleared and being discharged. [address] [place], [place] [phone] was the contact confirmed there was not any paperwork needed from us  and thanked her for allowing us  to car for their resident.

## 2024-04-23 NOTE — Assessment & Plan Note (Signed)
 04-23-2024 stable. HgB is 14.8 g/dl. She is not anemic.

## 2024-04-23 NOTE — Plan of Care (Signed)
 Patient alert & oriented x 4. Discharge today patient had virtual visit with provider, medic, and Tax inspector. LDA removed, AVS paper copy given and discharge instructions fully reviewed. All patients questions answered and current health equipment retrieved and returned. IMM letter submitted. Patient thanked team for her care.   Problem: Elimination: Goal: Will not experience complications related to bowel motility Outcome: Adequate for Discharge Goal: Will not experience complications related to urinary retention Outcome: Adequate for Discharge   Problem: Safety: Goal: Ability to remain free from injury will improve Outcome: Adequate for Discharge   Problem: Education: Goal: Knowledge of General Education information will improve Description: Including pain rating scale, medication(s)/side effects and non-pharmacologic comfort measures Outcome: Completed/Met   Problem: Health Behavior/Discharge Planning: Goal: Ability to manage health-related needs will improve Outcome: Completed/Met   Problem: Activity: Goal: Risk for activity intolerance will decrease Outcome: Completed/Met   Problem: Nutrition: Goal: Adequate nutrition will be maintained Outcome: Completed/Met

## 2024-04-23 NOTE — Progress Notes (Addendum)
 No data alert alarm per current health:Patient called and is stable. Patient encouraged to call [facility] with updates or concerns.   0454--Current health data starting to backfill, will continue to monitor need for additional contact to patient.   0501--Current health data now showing data in realtime. Patient data remainder stable throughout outage period. Will continue to monitor.    0518--Outbound call contact. Patient informed of update on current health data remaining stable. Patient agreed to taking medication that is almost due at the time of contact call.       0524--Video contact completed-Patient identifiers review  patient A&O. Patient sitting at table, denies health issues or concerns. Patient confirmed she has not consumed food or drink for prior to medication administration. Patient Self-administered medication. Blood pressure reading also completed. 143/79 HR 84. Plan for upcoming medications reviewed. Patient encouraged to contact [facility] as needed.

## 2024-04-23 NOTE — Discharge Summary (Signed)
 [facility] Discharge Summary   Patient name: Ana Russell  Admit date:     04/17/2024  Discharge date: 04/23/2024  Attending Physician: SIM EMERY CROME [[id number]]  Discharge Physician: Camellia Door   PCP: Clarice Nottingham, MD  Admitted From: Home  Disposition:  Home  Recommendations for Outpatient Follow-up:  Follow up with PCP in 1-2 weeks Follow up with Dr. Sater(neurology) on April 24, 2024 @ 11 AM  Home Health:No Equipment/Devices: None    Discharge Condition:Stable CODE STATUS:FULL Diet recommendation: Heart Healthy Fluid Restriction: None  Hospital Summary: CC: fall, double vision HPI:  Ana Russell is a 68 y.o. female with medical history significant of multiple sclerosis, history of previous C. difficile colitis, hypothyroidism, Mnire's disease, osteoporosis, endometrial hyperplasia who follows with [facility] Dr. Vear for her multiple sclerosis coming to the ER due to worsening vision changes.  Patient has history of GERD abnormalities but this has gotten worse lately.  She was unsteady on her feet.  She had a fall about 5 days ago after dizziness.  Since then her vision has become double.  Patient was post to follow-up with a neuro-ophthalmologist at [facility] but has not received a call back for follow-up.  Both eyes are showing problems with the vision.  She also noted lateral left eye deviation so she has been patching her eye intermittently in order to have her vision.  No other focal weakness.  In the ER patient was seen at evaluated.  Neurology consulted and recommended admission for MS flare up and high dose steroids.  Patient is hemodynamically stable with no new complaint.    Significant Events: Admitted 04/17/2024 for acute MS flare 04-17-2024 started on IV solumedrol 1000 mg daily x 5 days 04-20-2024 admitted to Hospital at [facility] program 04-21-2024 Completed IV solumedrol 04-22-2024 telepsych consult performed due to  paranoia. Felt that her acute psych issues were due to steroids. Pt did not qualify for acute inpatient psych treatment.  Admission Labs: WBC 5.2, HgB 14, plt 207 Na 141, K 4.3, CO2 of 27, BUN 13, scr 0.75, glu 113 TSH 1.907, FT4 1.43  Admission Imaging Studies: MRI brain Numerous new nodular and ring enhancing lesions within the cerebral hemispheres bilaterally, including the lateral ventricles, left inferior frontal white matter, and left posterior temporal white matter. Findings most likely represent exacerbation of multiple sclerosis, but clinical correlation is recommended. 2. Extensive periventricular white matter disease with a frond-like appearance characteristic of multiple sclerosis. 3. No evidence of hemorrhage or restricted diffusion. MRI c-spine No cord compression or focal cord lesion. No abnormal contrast enhancement of the cord. 2. Abnormal enhancement along the floor of the fourth ventricle at the dorsal pontomedullary junction. Question abnormal T2 signal in the midbrain. Findings could be seen with demyelinating disease. See results of brain MRI. 3. Mild spondylosis at C5-6 and C6-7. Mild right foraminal narrowing at C5-6. CTA head/neck No hemorrhage or other acute abnormality 2. No cervical carotid artery stenosis 3. No significant vertebrobasilar or intracranial disease  Significant Labs:   Significant Imaging Studies:   Antibiotic Therapy: Anti-infectives (From admission, onward)    None       Procedures:   Consultants: neurology   Hospital Course by Problem: * Multiple sclerosis (HCC) 04-23-2024 pt completed 5 days of high dose IV solumedrol on 04-21-2024. Pt has f/u appointment to see Dr. Vear on 04-24-2024 @ 11 AM. Discussed that she would need to be discharged from [facility] program in order to go to  her outpatient neurology appointment. She is agreeable to DC from Hospital at Home program today. Discussed not treating her slightly elevated BP at  this time due to her recent high dose steroids. Discussed that overtreatment of BP could result in hypotension as the steroid effects wear off. Recommend that she f/u with her PCP in 1 week if her BP is still elevated. Pt states she has never had an issue with high blood pressure before.  Gait difficulty 04-23-2024 pt had been assessed by PT/OT prior to DC. Paramedic states that pt is ambulatory at her apartment at [facility] independent living.  Hyperlipidemia 04-23-2024 stable.  Asthma 04-23-2024 controlled.  Anxiety state 04-23-2024 stable.  Vitamin D  deficiency 04-23-2024 stable.  Hypothyroidism 04-23-2024 stable. TSH is normal.  ANEMIA-IRON DEFICIENCY-resolved as of 04/23/2024 04-23-2024 stable. HgB is 14.8 g/dl. She is not anemic.    Discharge Diagnoses:  Principal Problem:   Multiple sclerosis (HCC) Active Problems:   Gait difficulty   Hypothyroidism   Vitamin D  deficiency   Anxiety state   Asthma   Hyperlipidemia   Discharge Instructions  Discharge Instructions     Call MD for:  difficulty breathing, headache or visual disturbances   Complete by: As directed    Call MD for:  extreme fatigue   Complete by: As directed    Call MD for:  hives   Complete by: As directed    Call MD for:  persistant dizziness or light-headedness   Complete by: As directed    Call MD for:  persistant nausea and vomiting   Complete by: As directed    Call MD for:  redness, tenderness, or signs of infection (pain, swelling, redness, odor or green/yellow discharge around incision site)   Complete by: As directed    Call MD for:  severe uncontrolled pain   Complete by: As directed    Call MD for:  temperature >100.4   Complete by: As directed    Diet - low sodium heart healthy   Complete by: As directed    Diet Carb Modified   Complete by: As directed    Discharge instructions   Complete by: As directed    1. Follow up with your primary care provider in 1-2 weeks  following discharge from hospital. 2. Follow up with your neurologist Dr. Charlie Crete tomorrow(April 24, 2024) @ 11 AM for followup on your recent MS flare   Increase activity slowly   Complete by: As directed       Allergies as of 04/23/2024   No Known Allergies      Medication List     TAKE these medications    alendronate  70 MG tablet Commonly known as: FOSAMAX  Take 70 mg by mouth once a week. Takes on Friday   levothyroxine  112 MCG tablet Commonly known as: SYNTHROID  Take 112 mcg by mouth daily.   rosuvastatin  5 MG tablet Commonly known as: CRESTOR  Take 5 mg by mouth daily.   VITAMIN C PO Take by mouth.   VITAMIN D  PO Take 2,000 Units by mouth daily.  Takes 4000 units on Sundays only        Follow-up Information     Sater, Charlie LABOR, MD Follow up on 04/24/2024.   Specialty: Neurology Why: 11:00 Contact information: [address] [place] [address] [phone]                No Known Allergies  Discharge Exam: Vitals:   04/23/24 1121 04/23/24 1122  BP:  ROLLEN)  169/98  Pulse:    Resp: 18   Temp: 97.8 F (36.6 C)   SpO2: 97%    Pt examined by Norman, paramedic during pt's in-home visit. Documented by this Clinical research associate Physical Exam Vitals and nursing note reviewed.  Constitutional:      General: She is not in acute distress.    Appearance: She is not toxic-appearing or diaphoretic.  HENT:     Head: Normocephalic and atraumatic.  Cardiovascular:     Rate and Rhythm: Normal rate and regular rhythm.  Pulmonary:     Effort: Pulmonary effort is normal.     Breath sounds: Normal breath sounds.  Abdominal:     General: Abdomen is flat. Bowel sounds are normal.     Palpations: Abdomen is soft.  Skin:    General: Skin is warm and dry.     Capillary Refill: Capillary refill takes less than 2 seconds.  Neurological:     Mental Status: She is alert.     Gait: Gait normal.     The results of significant diagnostics from this  hospitalization (including imaging, microbiology, ancillary and laboratory) are listed below for reference.     Labs:  Basic Metabolic Panel: Recent Labs  Lab 04/17/24 1208 04/17/24 2306 04/18/24 0509 04/20/24 0452 04/21/24 1406  NA 141  --  140 141 142  K 4.3  --  3.5 4.9 3.8  CL 104  --  102 109 106  CO2 27  --  24 22 26   GLUCOSE 113*  --  150* 162* 117*  BUN 13  --  12 17 18   CREATININE 0.75 0.88 1.06* 0.85 0.90  CALCIUM  9.8  --  9.2 8.9 9.4   Liver Function Tests: Recent Labs  Lab 04/18/24 0509 04/20/24 0452 04/21/24 1406  AST 22 30 23   ALT 19 19 22   ALKPHOS 39 33* 40  BILITOT 0.9 1.0 0.7  PROT 6.6 6.2* 7.5  ALBUMIN 3.8 3.7 4.5   CBC: Recent Labs  Lab 04/17/24 1208 04/17/24 2306 04/18/24 0509 04/20/24 0452 04/21/24 1406  WBC 5.2 5.8 4.4 8.6 9.5  NEUTROABS 3.1  --   --   --   --   HGB 14.0 15.0 14.3 13.9 14.8  HCT 43.9 45.3 43.5 42.1 45.6  MCV 93.2 91.3 92.4 91.3 91.2  PLT 207 211 199 198 222   CBG: Recent Labs  Lab 04/17/24 1145  GLUCAP 127*   Lipid Profile Recent Labs    04/21/24 1406  CHOL 240*  HDL 110  LDLCALC 112*  TRIG 90  CHOLHDL 2.2   Sepsis Labs Recent Labs  Lab 04/17/24 2306 04/18/24 0509 04/20/24 0452 04/21/24 1406  WBC 5.8 4.4 8.6 9.5    Thyroid  Labs: Lab Results  Component Value Date/Time   TSH 1.907 04/17/2024 12:08 PM   FREET4 1.43 (H) 04/17/2024 12:08 PM   Procedures/Studies: CT ANGIO HEAD NECK W WO CM Result Date: 04/17/2024 CLINICAL DATA:  Fall, visual disturbance EXAM: CT ANGIOGRAPHY HEAD AND NECK WITH AND WITHOUT CONTRAST TECHNIQUE: Multidetector CT imaging of the head and neck was performed using the standard protocol during bolus administration of intravenous contrast. Multiplanar CT image reconstructions and MIPs were obtained to evaluate the vascular anatomy. Carotid stenosis measurements (when applicable) are obtained utilizing NASCET criteria, using the distal internal carotid diameter as the  denominator. RADIATION DOSE REDUCTION: This exam was performed according to the departmental dose-optimization program which includes automated exposure control, adjustment of the mA and/or kV according to patient size  and/or use of iterative reconstruction technique. CONTRAST:  75mL OMNIPAQUE  IOHEXOL  350 MG/ML SOLN COMPARISON:  None Available. FINDINGS: CT HEAD: There are areas of low attenuation in the cerebral white matter. There is no hemorrhage. No acute ischemic changes. No mass lesion. The ventricles are normal. Skull/sinuses/orbits: No significant abnormality. CTA NECK: CTA NECK Aortic arch: No proximal vessel stenosis. Right carotid: Normal Left carotid: Normal Right vertebral: Normal Left vertebral: Normal Soft tissues: No significant abnormality Other comments: None CTA HEAD: CTA HEAD Right anterior circulation: The internal carotid artery is patent without significant stenosis. The anterior and middle cerebral arteries are patent without significant stenosis or proximal branch occlusion. No aneurysm. Left anterior circulation: The internal carotid artery is patent without significant stenosis. The anterior and middle cerebral arteries are patent without significant stenosis or proximal branch occlusion. No aneurysm. Posterior circulation: Both vertebral arteries are patent. There is no significant basilar stenosis. Both posterior cerebral arteries are patent without significant stenosis or proximal branch occlusion. No aneurysm. IMPRESSION: 1. No hemorrhage or other acute abnormality 2. No cervical carotid artery stenosis 3. No significant vertebrobasilar or intracranial disease Electronically Signed   By: Nancyann Burns M.D.   On: 04/17/2024 15:15   MR Brain W and Wo Contrast Result Date: 04/17/2024 EXAM: MRI BRAIN WITH AND WITHOUT CONTRAST 04/17/2024 02:42:50 PM TECHNIQUE: Multiplanar multisequence MRI of the head/brain was performed with and without the administration of intravenous contrast.  COMPARISON: MRI of the head dated 02/23/2024. CLINICAL HISTORY: Neuro deficit, acute, stroke suspected. FINDINGS: BRAIN AND VENTRICLES: There are numerous new nodular and ring enhancing lesions present within the cerebral hemispheres bilaterally. There is a frond-like lesion along the body of the right lateral ventricle at the frontoparietal junction. There are nodular, appendable, and subependymal lesions along the posterior horn of the left lateral ventricle. There are ring enhancing lesions along the lateral surface of the occipital horn of the left lateral ventricle. There is a nodular enhancing lesion adjacent to the right occipital horn. There is a ring enhancing lesion present medially within the left inferior frontal white matter. There is a ring enhancing lesion within the left posterior temporal white matter. There is also a ring enhancing lesion lateral to the temporal horn of the right lateral ventricle. On the FLAIR sequence, there is extensive periventricular white matter disease, with a frond-like appearance characteristic of multiple sclerosis. There is no evidence of hemorrhage. There are numerous foci of mildly increased signal on the diffusion-weighted images, compatible with facilitated diffusion. There is no restricted diffusion evident. ORBITS: No acute abnormality. SINUSES: No acute abnormality. BONES AND SOFT TISSUES: Normal bone marrow signal and enhancement. No acute soft tissue abnormality. IMPRESSION: 1. Numerous new nodular and ring enhancing lesions within the cerebral hemispheres bilaterally, including the lateral ventricles, left inferior frontal white matter, and left posterior temporal white matter. Findings most likely represent exacerbation of multiple sclerosis, but clinical correlation is recommended. 2. Extensive periventricular white matter disease with a frond-like appearance characteristic of multiple sclerosis. 3. No evidence of hemorrhage or restricted diffusion.  Electronically signed by: evalene coho 04/17/2024 03:14 PM EDT RP Workstation: [id number]   MR Cervical Spine W and Wo Contrast Result Date: 04/17/2024 CLINICAL DATA:  Ataxia. Nontraumatic. Cervical pathology suspected. EXAM: MRI CERVICAL SPINE WITHOUT AND WITH CONTRAST TECHNIQUE: Multiplanar and multiecho pulse sequences of the cervical spine, to include the craniocervical junction and cervicothoracic junction, were obtained without and with intravenous contrast. CONTRAST:  5mL GADAVIST  GADOBUTROL  1 MMOL/ML IV SOLN COMPARISON:  02/23/2024 FINDINGS: Alignment:  Normal Vertebrae: No fracture or focal bone lesion. Cord: No cord compression or focal cord lesion. Ample subarachnoid space surrounds the cord. No abnormal contrast enhancement of the cord. Abnormal enhancement is noted along the floor the fourth ventricle at the dorsal pontomedullary junction. Question abnormal T2 signal in the midbrain. Findings could be seen with demyelinating disease. See results of brain MRI. Posterior Fossa, vertebral arteries, paraspinal tissues: See above. Disc levels: No significant finding from the foramen magnum through C4-5. C5-6: Mild spondylosis.  Mild right foraminal narrowing. C6-7: Mild spondylosis.  No stenosis. C7-T1: Normal IMPRESSION: 1. No cord compression or focal cord lesion. No abnormal contrast enhancement of the cord. 2. Abnormal enhancement along the floor of the fourth ventricle at the dorsal pontomedullary junction. Question abnormal T2 signal in the midbrain. Findings could be seen with demyelinating disease. See results of brain MRI. 3. Mild spondylosis at C5-6 and C6-7. Mild right foraminal narrowing at C5-6. Electronically Signed   By: Oneil Officer M.D.   On: 04/17/2024 15:11    Time coordinating discharge: 60 mins  SIGNED:  Camellia Door, DO [facility] 04/23/24, 12:06 PM

## 2024-04-23 NOTE — Progress Notes (Signed)
 Hospital at Home PROGRESS NOTE    Ana Russell  FMW:[id number] DOB: [birth year] DOA: 04/17/2024 PCP: Clarice Nottingham, MD  Subjective: Pt seen via video visit today. Paramedics with Norman and Shanda. Vision is not any worse today. Has appointment to see her neurologist tomorrow 04-24-2024 with Dr. Vear.   Provider Location: [place], [place] Patient examined by: Norman, Paramedic and Shanda [employer] Course: CC: fall, double vision HPI:  Ana Russell is a 68 y.o. female with medical history significant of multiple sclerosis, history of previous C. difficile colitis, hypothyroidism, Mnire's disease, osteoporosis, endometrial hyperplasia who follows with [facility] neurolog[facility] Dr. Vear for her multiple sclerosis coming to the ER due to worsening vision changes.  Patient has history of GERD abnormalities but this has gotten worse lately.  She was unsteady on her feet.  She had a fall about 5 days ago after dizziness.  Since then her vision has become double.  Patient was post to follow-up with a neuro-ophthalmologist at [facility] but has not received a call back for follow-up.  Both eyes are showing problems with the vision.  She also noted lateral left eye deviation so she has been patching her eye intermittently in order to have her vision.  No other focal weakness.  In the ER patient was seen at evaluated.  Neurology consulted and recommended admission for MS flare up and high dose steroids.  Patient is hemodynamically stable with no new complaint.    Significant Events: Admitted 04/17/2024 for acute MS flare 04-17-2024 started on IV solumedrol 1000 mg daily x 5 days 04-20-2024 admitted to Hospital at [facility] program 04-21-2024 Completed IV solumedrol 04-22-2024 telepsych consult performed due to paranoia. Felt that her acute psych issues were due to steroids. Pt did not qualify for acute inpatient psych treatment.  Admission Labs: WBC 5.2, HgB 14, plt 207 Na 141, K 4.3, CO2  of 27, BUN 13, scr 0.75, glu 113 TSH 1.907, FT4 1.43  Admission Imaging Studies: MRI brain Numerous new nodular and ring enhancing lesions within the cerebral hemispheres bilaterally, including the lateral ventricles, left inferior frontal white matter, and left posterior temporal white matter. Findings most likely represent exacerbation of multiple sclerosis, but clinical correlation is recommended. 2. Extensive periventricular white matter disease with a frond-like appearance characteristic of multiple sclerosis. 3. No evidence of hemorrhage or restricted diffusion. MRI c-spine No cord compression or focal cord lesion. No abnormal contrast enhancement of the cord. 2. Abnormal enhancement along the floor of the fourth ventricle at the dorsal pontomedullary junction. Question abnormal T2 signal in the midbrain. Findings could be seen with demyelinating disease. See results of brain MRI. 3. Mild spondylosis at C5-6 and C6-7. Mild right foraminal narrowing at C5-6. CTA head/neck No hemorrhage or other acute abnormality 2. No cervical carotid artery stenosis 3. No significant vertebrobasilar or intracranial disease  Significant Labs:   Significant Imaging Studies:   Antibiotic Therapy: Anti-infectives (From admission, onward)    None       Procedures:   Consultants: neurology    Assessment and Plan: * Multiple sclerosis (HCC) 04-23-2024 pt completed 5 days of high dose IV solumedrol on 04-21-2024. Pt has f/u appointment to see Dr. Vear on 04-24-2024 @ 11 AM. Discussed that she would need to be discharged from [facility] program in order to go to her outpatient neurology appointment. She is agreeable to DC from [facility] program today. Discussed not treating her slightly elevated BP at this time due to her recent  high dose steroids. Discussed that overtreatment of BP could result in hypotension as the steroid effects wear off. Recommend that she f/u with her PCP in 1 week if  her BP is still elevated. Pt states she has never had an issue with high blood pressure before.  Gait difficulty 04-23-2024 pt had been assessed by PT/OT prior to DC. Paramedic states that pt is ambulatory at her apartment at [facility] independent living.  Hyperlipidemia 04-23-2024 stable.  Asthma 04-23-2024 controlled.  Anxiety state 04-23-2024 stable.  Vitamin D  deficiency 04-23-2024 stable.  Hypothyroidism 04-23-2024 stable. TSH is normal.  ANEMIA-IRON DEFICIENCY-resolved as of 04/23/2024 04-23-2024 stable. HgB is 14.8 g/dl. She is not anemic.   DVT prophylaxis:   Pt is ambulatory   Code Status: Full Code Family Communication: no family present during video visit.  Pt is decisional Disposition Plan: home Reason for continuing need for hospitalization: stable for DC  Objective: Vitals:   04/23/24 0501 04/23/24 0524 04/23/24 1121 04/23/24 1122  BP:  (!) 143/79  (!) 169/98  Pulse: 76 84    Resp: 13  18   Temp:   97.8 F (36.6 C)   TempSrc:   Oral   SpO2: 99%  97%   Weight:      Height:       No intake or output data in the 24 hours ending 04/23/24 1205 Filed Weights   04/17/24 1130  Weight: 54.4 kg    Examination: Note that physical examination performed by paramedic on-scene and documented by provider Video Exam performed by video enabled technology  Physical Exam Vitals and nursing note reviewed.  Constitutional:      General: She is not in acute distress.    Appearance: She is not toxic-appearing or diaphoretic.  HENT:     Head: Normocephalic and atraumatic.  Cardiovascular:     Rate and Rhythm: Normal rate and regular rhythm.  Pulmonary:     Effort: Pulmonary effort is normal.     Breath sounds: Normal breath sounds.  Abdominal:     General: Abdomen is flat. Bowel sounds are normal.     Palpations: Abdomen is soft.  Skin:    General: Skin is warm and dry.     Capillary Refill: Capillary refill takes less than 2 seconds.  Neurological:      Mental Status: She is alert.     Gait: Gait normal.     Data Reviewed: I have personally reviewed following labs and imaging studies  CBC: Recent Labs  Lab 04/17/24 1208 04/17/24 2306 04/18/24 0509 04/20/24 0452 04/21/24 1406  WBC 5.2 5.8 4.4 8.6 9.5  NEUTROABS 3.1  --   --   --   --   HGB 14.0 15.0 14.3 13.9 14.8  HCT 43.9 45.3 43.5 42.1 45.6  MCV 93.2 91.3 92.4 91.3 91.2  PLT 207 211 199 198 222   Basic Metabolic Panel: Recent Labs  Lab 04/17/24 1208 04/17/24 2306 04/18/24 0509 04/20/24 0452 04/21/24 1406  NA 141  --  140 141 142  K 4.3  --  3.5 4.9 3.8  CL 104  --  102 109 106  CO2 27  --  24 22 26   GLUCOSE 113*  --  150* 162* 117*  BUN 13  --  12 17 18   CREATININE 0.75 0.88 1.06* 0.85 0.90  CALCIUM  9.8  --  9.2 8.9 9.4   GFR: Estimated Creatinine Clearance: 50.7 mL/min (by C-G formula based on SCr of 0.9 mg/dL). Liver Function Tests:  Recent Labs  Lab 04/18/24 0509 04/20/24 0452 04/21/24 1406  AST 22 30 23   ALT 19 19 22   ALKPHOS 39 33* 40  BILITOT 0.9 1.0 0.7  PROT 6.6 6.2* 7.5  ALBUMIN 3.8 3.7 4.5   CBG: Recent Labs  Lab 04/17/24 1145  GLUCAP 127*   Lipid Profile: Recent Labs    04/21/24 1406  CHOL 240*  HDL 110  LDLCALC 112*  TRIG 90  CHOLHDL 2.2   Scheduled Meds:  alendronate   70 mg Oral Weekly   cholecalciferol   2,000 Units Oral Daily   feeding supplement  237 mL Oral TID BM   levothyroxine   112 mcg Oral Daily   multivitamin with minerals  1 tablet Oral Daily   pantoprazole   40 mg Oral BID   polyethylene glycol  17 g Oral Once   rosuvastatin   5 mg Oral Daily   Continuous Infusions:   LOS: 6 days   Time spent: 60 minutes  Camellia Door, DO  [facility]  04/23/2024, 12:05 PM

## 2024-04-23 NOTE — Assessment & Plan Note (Signed)
 04-23-2024 stable. TSH is normal.

## 2024-04-23 NOTE — Progress Notes (Signed)
 11:05 virtual visit with Medic, provider, and Tax inspector. Provider discussing transition of care back to her neurologist Dr Vear. Provider discussed elevated BP possibly related to high dose steroids to monitor her BP and notify her Dr if numbers remain elevated patient agreed and verbalized back understanding. Thorough review of AVS with patient including medications. Time provided to ask questions. All questions answered to patients satisfaction. IMM letter was submitted.

## 2024-04-23 NOTE — Assessment & Plan Note (Signed)
 04-23-2024 controlled.

## 2024-04-24 ENCOUNTER — Encounter: Payer: Self-pay | Admitting: Neurology

## 2024-04-24 ENCOUNTER — Telehealth (INDEPENDENT_AMBULATORY_CARE_PROVIDER_SITE_OTHER): Admitting: Neurology

## 2024-04-24 ENCOUNTER — Other Ambulatory Visit (INDEPENDENT_AMBULATORY_CARE_PROVIDER_SITE_OTHER): Payer: Self-pay

## 2024-04-24 VITALS — Wt 119.0 lb

## 2024-04-24 DIAGNOSIS — R200 Anesthesia of skin: Secondary | ICD-10-CM

## 2024-04-24 DIAGNOSIS — R269 Unspecified abnormalities of gait and mobility: Secondary | ICD-10-CM | POA: Diagnosis not present

## 2024-04-24 DIAGNOSIS — Z79899 Other long term (current) drug therapy: Secondary | ICD-10-CM

## 2024-04-24 DIAGNOSIS — H532 Diplopia: Secondary | ICD-10-CM | POA: Diagnosis not present

## 2024-04-24 DIAGNOSIS — Z2981 Encounter for HIV pre-exposure prophylaxis: Secondary | ICD-10-CM

## 2024-04-24 DIAGNOSIS — Z7189 Other specified counseling: Secondary | ICD-10-CM

## 2024-04-24 DIAGNOSIS — G35 Multiple sclerosis: Secondary | ICD-10-CM | POA: Diagnosis not present

## 2024-04-24 DIAGNOSIS — Z114 Encounter for screening for human immunodeficiency virus [HIV]: Secondary | ICD-10-CM

## 2024-04-24 DIAGNOSIS — Z0289 Encounter for other administrative examinations: Secondary | ICD-10-CM

## 2024-04-24 NOTE — Progress Notes (Signed)
 Pt stopped by office and completed labs Dr. Vear ordered and signed [facility] start form. I also obtained updated weight: 119lb

## 2024-04-24 NOTE — Progress Notes (Signed)
 [facility]  PATIENT: Ana Russell DOB: 1956/01/27  REFERRING DOCTOR OR PCP:   Dr. Ryan Hives SOURCE: Patient, notes from Dr. Onita, imaging and lab reports, MRI images personally reviewed  _________________________________   HISTORICAL  CHIEF COMPLAINT:  Chief Complaint  Patient presents with   Multiple Sclerosis    HISTORY OF PRESENT ILLNESS:  Ana Russell  is a 68 yo woman who was diagnosed with multiple sclerosis in 1991 she had a significant exacerbation in July/August 2025.  Virtual Visit via Video Note I connected with Ana Russell  on 04/24/24 at 11:00 AM EDT by a video enabled telemedicine application and verified that I am speaking with the correct person.  I discussed the limitations of evaluation and management by telemedicine and the availability of in person appointments. The patient expressed understanding and agreed to proceed.  Patient was at home; provider in the office  UPDATE 04/24/2024 I saw her several weeks ago and she was noticing some facial numbness but was otherwise at her baseline.  An MRI a couple months ago had not shown any new lesions.  However, a few days after the visit with me she began to experience worsening facial/inner mouth numbness and diplopia and gait became more balance.  She presented to the emergency room.  MRI of the brain showed several enhancing foci consistent with acute demyelination. She received 5 days of IV Solumedrol.    She feels the numbness on the right has improved and left side numbness improved.    Her diplopia has not resolved yet.    She discontinued Aubagio  in late 2022 and had done well before this current exacerbation.    Gait has since improved  .She uses a cane or walking stick for balance.    She denies dysesthesias in the limbs but has mild foot numbness.   Legs are strong.      Vision is ok but eyes feel different and she is going to see her the doctor.   She wears glasses.  No  diplopia  She has some bladder urgency but no incontinence.  She will have some constipation but notes this has been a lifelong issue.  She denies significant fatigue and remains active exercising most days.  She gardens periodically and does walks.  She does note some sleep maintenance insomnia.  Sometimes she will wake up for couple hours at night and just get up.   She denies any difficulties with mood or cognition in general.  She is occasionally irritable.  She notes occasional reduced short-term memory.  MS History: She developed diplopia in 34 at age 49.   She had an abnormal MRI and was told she might have MS but due to no DMT at that time, she ws not placed on a DMT.   She did try bee sting and changes in diet.   She moved to [facility] and did well a while.  She went to the [facility] clinic in the late 1990s when she had mild gait issues and was told there was no MS in her spinal cord.    She had a large exacerbation in 2009.   She had difficulty with speech and writing.   Dr. Onita started to see her.    She was placed on Rebif  and had done well x 10 years but had a lot of issues with skin reactions   She opted to switch to an oral medication.   She has been on Aubagio  x 18  months and has had hair loss including eye brows.     Imaging reviewed: MRI of the brain 04/17/2024 showed multiple T2/FLAIR hyperintense foci in the cerebral hemispheres, cerebellum and pons.  There are several enhancing lesions in the cerebral hemispheres and 1 small enhancing lesion in the pons along the fourth ventricle.  MRI of the cervical spine 04/17/2024 showed no new or any enhancing lesions in the spinal cord.  Only mild degenerative changes noted.  MRI of the brain 02/23/2024 Multiple T2/FLAIR hyperintense foci in the cerebral hemispheres and a small focus in the left cerebellar hemisphere.  These are in a pattern consistent with chronic demyelinating plaque associated with multiple sclerosis.  None of the foci enhance or  appear to be acute.  Compared to the MRI from 05/26/2021, there were no new lesions.     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   Mucoperiosteal thickening in the maxillary sinuses that could be seen with mild chronic maxillary sinusitis  MRI cervical spine 02/23/2024 showed T2 hyperintense foci are noted within the spinal cord towards the left adjacent to C2, posteriorly adjacent to C3-C4: Anteriorly adjacent to C4, towards the left adjacent to C5, towards the left adjacent to C6, towards the right adjacent to T1. None of these foci enhance. The foci adjacent to C4 and C6 were not clearly seen on the MRI from 2009 while other foci while present.    MRi of the brain in 2017 was unchanged from 2009 and the MRI 07/16/2021 was unchanged from 2017.   MRI of the cervical and thoracic spine.   All of the MRIs show multiple T2/FLAIR hyperintense foci, many in the periventricular white matter.  None of the foci appear to be acute.  MRI 2009 compared to 1991 showed some progression of the MS during those 18 years  MRI of the cervical and thoracic spine from 2009 was also reviewed.  The spinal cord is normal.  REVIEW OF SYSTEMS: Constitutional: No fevers, chills, sweats, or change in appetite Eyes: No visual changes, double vision, eye pain Ear, nose and throat: No hearing loss, ear pain, nasal congestion, sore throat Cardiovascular: No chest pain, palpitations Respiratory:  No shortness of breath at rest or with exertion.   No wheezes GastrointestinaI: No nausea, vomiting, diarrhea, abdominal pain, fecal incontinence Genitourinary:  No dysuria, urinary retention or frequency.  No nocturia. Musculoskeletal:  No neck pain, back pain Integumentary: No rash, pruritus, skin lesions Neurological: as above Psychiatric: No depression at this time.  No anxiety Endocrine: No palpitations, diaphoresis, change in appetite, change in weigh or increased  thirst Hematologic/Lymphatic:  No anemia, purpura, petechiae. Allergic/Immunologic: No itchy/runny eyes, nasal congestion, recent allergic reactions, rashes  ALLERGIES: No Known Allergies  HOME MEDICATIONS:  Current Outpatient Medications:    alendronate  (FOSAMAX ) 70 MG tablet, Take 70 mg by mouth once a week. Takes on Friday, Disp: , Rfl:    Ascorbic Acid (VITAMIN C PO), Take by mouth., Disp: , Rfl:    Cholecalciferol  (VITAMIN D  PO), Take 2,000 Units by mouth daily.  Takes 4000 units on Sundays only, Disp: , Rfl:    levothyroxine  (SYNTHROID ) 112 MCG tablet, Take 112 mcg by mouth daily., Disp: , Rfl:    rosuvastatin  (CRESTOR ) 5 MG tablet, Take 5 mg by mouth daily., Disp: , Rfl:   PAST MEDICAL HISTORY: Past Medical History:  Diagnosis Date   C. difficile colitis 2013   Endometrial hyperplasia without atypia, simple 09/2003   NORMAL ENDOMETRIAL BIOPSY  03/2005   Fluid retention    in ears   Hypothyroidism    Meniere disease    MS (multiple sclerosis) (HCC) 02/25/2011   Osteoporosis 01/2018   T score -2.7    PAST SURGICAL HISTORY: Past Surgical History:  Procedure Laterality Date   DILATION AND CURETTAGE OF UTERUS     TAB   HYSTEROSCOPY  09/2003   POLYPS   KNEE SURGERY  2011   bilateral knee scopes   PELVIC LAPAROSCOPY     THYROIDECTOMY, PARTIAL      FAMILY HISTORY: Family History  Problem Relation Age of Onset   Diabetes Mother    Cancer Sister        Thyroid    Heart attack Sister    Breast cancer Maternal Aunt    Diabetes Paternal Grandfather    Multiple sclerosis Neg Hx     SOCIAL HISTORY:  Social History   Socioeconomic History   Marital status: Married    Spouse name: Ed   Number of children: 0   Years of education: Bachelors   Highest education level: Not on file  Occupational History   Not on file  Tobacco Use   Smoking status: Former   Smokeless tobacco: Never  Vaping Use   Vaping status: Never Used  Substance and Sexual Activity   Alcohol  use: Yes    Alcohol/week: 6.0 standard drinks of alcohol    Types: 6 Glasses of wine per week    Comment: TUES THURS SAT SUN WINE   Drug use: No   Sexual activity: Not Currently    Birth control/protection: Post-menopausal    Comment: First IC >16, Partners-5, No STDs, No DES Exposure  Other Topics Concern   Not on file  Social History Narrative   Patient lives at home with husband (Ed)   Patient is right handed   Education level Bachelors degree   Caffeine consumption is 3 cups daily   Social Drivers of Corporate investment banker Strain: Not on file  Food Insecurity: No Food Insecurity (04/17/2024)   Hunger Vital Sign    Worried About Running Out of Food in the Last Year: Never true    Ran Out of Food in the Last Year: Never true  Transportation Needs: No Transportation Needs (04/17/2024)   PRAPARE - Administrator, Civil Service (Medical): No    Lack of Transportation (Non-Medical): No  Physical Activity: Not on file  Stress: Not on file  Social Connections: Unknown (04/18/2024)   Social Connection and Isolation Panel    Frequency of Communication with Friends and Family: More than three times a week    Frequency of Social Gatherings with Friends and Family: Twice a week    Attends Religious Services: Patient declined    Database administrator or Organizations: Patient declined    Attends Banker Meetings: Patient declined    Marital Status: Patient declined  Intimate Partner Violence: Not At Risk (04/17/2024)   Humiliation, Afraid, Rape, and Kick questionnaire    Fear of Current or Ex-Partner: No    Emotionally Abused: No    Physically Abused: No    Sexually Abused: No     PHYSICAL EXAM  Vitals:   04/24/24 1500  Weight: 119 lb (54 kg)     Body mass index is 19.21 kg/m.   General: The patient is well-developed and well-nourished and in no acute distress  HEENT:  Head shows bruising below and above the right eye from  her recent fall.   Sclera are anicteric.  Skin: Extremities are without rash or  edema.  Neurologic Exam  Mental status: The patient is alert and oriented x 3 at the time of the examination. The patient has apparent normal recent and remote memory, with an apparently normal attention span and concentration ability.   Speech is normal.  Cranial nerves: Extraocular movements are full.  No ptosis.  Facial strength and sensation was normal.  Vision was symmetric.SABRA No obvious hearing deficits are noted.  Motor:  Muscle bulk is normal.   Tone is normal. Strength is  5 / 5 in all 4 extremities.   Sensory: Sensory testing is intact to pinprick, soft touch and vibration sensation in all 4 extremities.  Coordination: Cerebellar testing reveals good finger-nose-finger and heel-to-shin bilaterally.  Gait and station: Station is normal.  She has a normal length stride though the gait is mildly wide and she can turn in 3 steps.  The tandem gait is mildly wide.  Romberg is negative.   Reflexes: Deep tendon reflexes are symmetric and normal bilaterally.      DIAGNOSTIC DATA (LABS, IMAGING, TESTING) - I reviewed patient records, labs, notes, testing and imaging myself where available.  Lab Results  Component Value Date   WBC 9.5 04/21/2024   HGB 14.8 04/21/2024   HCT 45.6 04/21/2024   MCV 91.2 04/21/2024   PLT 222 04/21/2024      Component Value Date/Time   NA 142 04/21/2024 1406   NA 144 12/04/2020 1109   K 3.8 04/21/2024 1406   CL 106 04/21/2024 1406   CO2 26 04/21/2024 1406   GLUCOSE 117 (H) 04/21/2024 1406   BUN 18 04/21/2024 1406   BUN 15 12/04/2020 1109   CREATININE 0.90 04/21/2024 1406   CREATININE 0.80 04/12/2019 0857   CALCIUM  9.4 04/21/2024 1406   PROT 7.5 04/21/2024 1406   PROT 6.7 12/04/2020 1109   ALBUMIN 4.5 04/21/2024 1406   ALBUMIN 4.6 12/04/2020 1109   AST 23 04/21/2024 1406   ALT 22 04/21/2024 1406   ALKPHOS 40 04/21/2024 1406   BILITOT 0.7 04/21/2024 1406   BILITOT 0.4 12/04/2020  1109   GFRNONAA >60 04/21/2024 1406   GFRAA 74 02/27/2020 0834   Lab Results  Component Value Date   CHOL 240 (H) 04/21/2024   HDL 110 04/21/2024   LDLCALC 112 (H) 04/21/2024   LDLDIRECT 121.7 02/25/2011   TRIG 90 04/21/2024   CHOLHDL 2.2 04/21/2024   Lab Results  Component Value Date   HGBA1C 6.1 (H) 02/27/2020   Lab Results  Component Value Date   VITAMINB12 345 02/27/2020   Lab Results  Component Value Date   TSH 1.907 04/17/2024       ASSESSMENT AND PLAN  Multiple sclerosis (HCC) - Plan: Hepatitis B surface antibody,qualitative, Hep B Surface Antigen, Hepatitis B Core AB, Total, QuantiFERON-TB Gold Plus, CBC with Differential/Platelet, Comprehensive metabolic panel with GFR, HIV Antibody (routine testing w rflx)  High risk medication use - Plan: Hepatitis B surface antibody,qualitative, Hep B Surface Antigen, Hepatitis B Core AB, Total, QuantiFERON-TB Gold Plus, CBC with Differential/Platelet, Comprehensive metabolic panel with GFR, HIV Antibody (routine testing w rflx)  Encounter for screening for HIV - Plan: HIV Antibody (routine testing w rflx)  Encounter for HIV screening and discussion of pre-exposure prophylaxis for HIV - Plan: HIV Antibody (routine testing w rflx)  Gait difficulty  Numbness  Diplopia  Due to her current exacerbation, we need to reinitiate a disease modifying therapy.  I discussed some options.  She had not tolerated Aubagio  well.  I will place her on Mavenclad as it is effective for relapses and new MS lesions.  Additionally, the benefit is long-term and she may not need to be placed back on a disease modifying therapy again.  We will check the necessary blood work Stay active and exercise as tolerated. Return in 6 mos or sooner if there are new or worsening neurologic symptoms.  This visit is part of a comprehensive longitudinal care medical relationship regarding the patients primary diagnosis of MS and related concerns.   Follow Up  Instructions: I discussed the assessment and treatment plan with the patient. The patient was provided an opportunity to ask questions and all were answered. The patient agreed with the plan and demonstrated an understanding of the instructions.    The patient was advised to call back or seek an in-person evaluation if the symptoms worsen or if the condition fails to improve as anticipated.  I provided 27 minutes of non-face-to-face time during this encounter.  Anushree Dorsi A. Vear, MD, [facility] 04/24/2024, 5:35 PM Certified in Neurology, Clinical Neurophysiology, Sleep Medicine and Neuroimaging  [facility] [address], [address] [place], [place] [address] [phone]

## 2024-04-24 NOTE — Telephone Encounter (Signed)
 Spoke with Dr. Vear. Pt going to be placed on Mavenclad. She needs to sign start form. He will put lab orders in now since pt coming today for labs.

## 2024-04-25 ENCOUNTER — Encounter: Payer: Self-pay | Admitting: Neurology

## 2024-04-27 ENCOUNTER — Ambulatory Visit: Payer: Self-pay | Admitting: Neurology

## 2024-04-27 LAB — COMPREHENSIVE METABOLIC PANEL WITH GFR
ALT: 17 IU/L (ref 0–32)
AST: 15 IU/L (ref 0–40)
Albumin: 4.2 g/dL (ref 3.9–4.9)
Alkaline Phosphatase: 47 IU/L (ref 44–121)
BUN/Creatinine Ratio: 25 (ref 12–28)
BUN: 24 mg/dL (ref 8–27)
Bilirubin Total: 0.3 mg/dL (ref 0.0–1.2)
CO2: 25 mmol/L (ref 20–29)
Calcium: 9.4 mg/dL (ref 8.7–10.3)
Chloride: 102 mmol/L (ref 96–106)
Creatinine, Ser: 0.96 mg/dL (ref 0.57–1.00)
Globulin, Total: 2.2 g/dL (ref 1.5–4.5)
Glucose: 103 mg/dL — ABNORMAL HIGH (ref 70–99)
Potassium: 4.4 mmol/L (ref 3.5–5.2)
Sodium: 140 mmol/L (ref 134–144)
Total Protein: 6.4 g/dL (ref 6.0–8.5)
eGFR: 64 mL/min/1.73 (ref >59)

## 2024-04-27 LAB — HEPATITIS B SURFACE ANTIGEN: Hepatitis B Surface Ag: NEGATIVE

## 2024-04-27 LAB — CBC WITH DIFFERENTIAL/PLATELET
Basophils Absolute: 0 x10E3/uL (ref 0.0–0.2)
Basos: 0 %
EOS (ABSOLUTE): 0.2 x10E3/uL (ref 0.0–0.4)
Eos: 3 %
Hematocrit: 44.6 % (ref 34.0–46.6)
Hemoglobin: 14.4 g/dL (ref 11.1–15.9)
Immature Grans (Abs): 0.1 x10E3/uL (ref 0.0–0.1)
Immature Granulocytes: 1 %
Lymphocytes Absolute: 2.1 x10E3/uL (ref 0.7–3.1)
Lymphs: 24 %
MCH: 30.4 pg (ref 26.6–33.0)
MCHC: 32.3 g/dL (ref 31.5–35.7)
MCV: 94 fL (ref 79–97)
Monocytes Absolute: 0.7 x10E3/uL (ref 0.1–0.9)
Monocytes: 8 %
Neutrophils Absolute: 5.7 x10E3/uL (ref 1.4–7.0)
Neutrophils: 64 %
Platelets: 213 x10E3/uL (ref 150–450)
RBC: 4.74 x10E6/uL (ref 3.77–5.28)
RDW: 12.7 % (ref 11.7–15.4)
WBC: 8.8 x10E3/uL (ref 3.4–10.8)

## 2024-04-27 LAB — QUANTIFERON-TB GOLD PLUS
QuantiFERON Mitogen Value: >10 [IU]/mL
QuantiFERON Nil Value: 0 [IU]/mL
QuantiFERON TB1 Ag Value: 0 [IU]/mL
QuantiFERON TB2 Ag Value: 0 [IU]/mL

## 2024-04-27 LAB — HEPATITIS B SURFACE ANTIBODY,QUALITATIVE: Hep B Surface Ab, Qual: NONREACTIVE

## 2024-04-27 LAB — HIV ANTIBODY (ROUTINE TESTING W REFLEX): HIV Screen 4th Generation wRfx: NONREACTIVE

## 2024-04-27 LAB — HEPATITIS B CORE ANTIBODY, TOTAL: Hep B Core Total Ab: NEGATIVE

## 2024-04-28 ENCOUNTER — Encounter: Payer: Self-pay | Admitting: Neurology

## 2024-04-30 NOTE — Telephone Encounter (Signed)
 Message below states [facility] scheduling into July 2026. Where would you like referral sent? Could you create a new referral to send?

## 2024-04-30 NOTE — Telephone Encounter (Signed)
 Dr. Vear- do you have an alternate place you would recommend?

## 2024-05-01 ENCOUNTER — Encounter: Payer: Self-pay | Admitting: Neurology

## 2024-05-01 NOTE — Telephone Encounter (Signed)
 New referral placed for [facility], thank you

## 2024-05-01 NOTE — Addendum Note (Signed)
 Addended by: JOSHUA MAURILIO CROME on: 05/01/2024 08:11 AM   Modules accepted: Orders

## 2024-05-01 NOTE — Telephone Encounter (Signed)
 Can you let the patient know an update? Thank you!

## 2024-05-02 ENCOUNTER — Other Ambulatory Visit: Payer: Self-pay | Admitting: *Deleted

## 2024-05-02 ENCOUNTER — Telehealth: Payer: Self-pay | Admitting: *Deleted

## 2024-05-02 DIAGNOSIS — G35 Multiple sclerosis: Secondary | ICD-10-CM

## 2024-05-02 MED ORDER — DIMETHYL FUMARATE 240 MG PO CPDR: DELAYED_RELEASE_CAPSULE | ORAL | 11 refills | Status: AC

## 2024-05-02 MED ORDER — DIMETHYL FUMARATE 120 MG PO CPDR: 1.0000 | DELAYED_RELEASE_CAPSULE | Freq: Two times a day (BID) | ORAL | 0 refills | Status: DC

## 2024-05-02 NOTE — Telephone Encounter (Signed)
 MD cleared pt to start on dimethyl fumarate . Starter dose/maintenance rx sent to [facility]. PA needed.

## 2024-05-02 NOTE — Telephone Encounter (Signed)
 PA for dimethyl fumarate  240mg  BID completed via CMM and sent to [facility]. Should have a determination within 3-5 business days. Key: [id number].

## 2024-06-14 ENCOUNTER — Encounter: Payer: Medicare Other | Admitting: Nurse Practitioner

## 2024-07-16 ENCOUNTER — Other Ambulatory Visit: Payer: Self-pay | Admitting: Nurse Practitioner

## 2024-07-16 DIAGNOSIS — Z1231 Encounter for screening mammogram for malignant neoplasm of breast: Secondary | ICD-10-CM

## 2024-07-25 ENCOUNTER — Encounter: Payer: Self-pay | Admitting: Neurology

## 2024-07-26 ENCOUNTER — Ambulatory Visit
Admission: RE | Admit: 2024-07-26 | Discharge: 2024-07-26 | Disposition: A | Discharge status: Home / Self Care | Source: Ambulatory Visit | Attending: Nurse Practitioner | Admitting: Nurse Practitioner

## 2024-07-26 DIAGNOSIS — Z1231 Encounter for screening mammogram for malignant neoplasm of breast: Secondary | ICD-10-CM

## 2024-08-09 ENCOUNTER — Encounter: Payer: Self-pay | Admitting: Neurology

## 2024-08-09 ENCOUNTER — Ambulatory Visit: Payer: Medicare Other | Admitting: Neurology

## 2024-08-09 VITALS — BP 133/70 | HR 54 | Resp 15 | Ht 65.0 in

## 2024-08-09 DIAGNOSIS — R200 Anesthesia of skin: Secondary | ICD-10-CM

## 2024-08-09 DIAGNOSIS — Z79899 Other long term (current) drug therapy: Secondary | ICD-10-CM

## 2024-08-09 DIAGNOSIS — H532 Diplopia: Secondary | ICD-10-CM

## 2024-08-09 DIAGNOSIS — G35C1 Active secondary progressive multiple sclerosis: Secondary | ICD-10-CM | POA: Diagnosis not present

## 2024-08-09 DIAGNOSIS — R269 Unspecified abnormalities of gait and mobility: Secondary | ICD-10-CM

## 2024-08-09 NOTE — Progress Notes (Signed)
 [facility]  PATIENT: Ana Russell DOB: 01-04-1956  REFERRING DOCTOR OR PCP:   Dr. Ryan Hives SOURCE: Patient, notes from Dr. Onita, imaging and lab reports, MRI images personally reviewed  _________________________________   HISTORICAL  CHIEF COMPLAINT:  Chief Complaint  Patient presents with   Multiple Sclerosis    Rm11, alone, Ms follow up    HISTORY OF PRESENT ILLNESS:  Ana Russell  is a 68 yo woman who was diagnosed with multiple sclerosis in 1991 but did not start any disease modifying therapy and her weighted labs in 2009.     UPDATE 04/24/2024 Due to an exacerbation in July 2025 with diplopia and facial numbness and new lesions on brain MRi we opted to have her restart a DMT.  She started DMF in September and is tolerating 240 mg bid well.   No significant I issues though notes some hot spell at night after some doses.    She discontinued Aubagio  in late 2022 and went 2023 and 2024 without exacerbation before the one in July 2025.     The facial numbness is still present inside her mouth though better than a few months ago.  Diplopia resolved.     Gait has since improved  .She uses a cane or walking stick for balance.   She could walk > 1 mile with her cane and did 2.5 miles last week  She denies dysesthesias in the limbs but has mild foot numbness.   Limbs have good strength.     Vision is ok but eyes feel different and she is going to see her the doctor.   She wears glasses.  No diplopia now  She has some bladder urgency but no incontinence.  She will have some constipation but notes this has been a lifelong issue.  She denies significant fatigue and remains active exercising most days.  She gardens periodically and does walks.  She does note some sleep maintenance insomnia.  She has 2-3 times nocturia.   No incontinence.  Has soe constipation.   She denies any difficulties with mood or cognition in general.  She is occasionally irritable.  She  notes occasional reduced short-term memory.  MS History: She developed diplopia in 10 at age 60.   She had an abnormal MRI and was told she might have MS but due to no DMT at that time, she ws not placed on a DMT.   She did try bee sting and changes in diet.   She moved to [facility] and did well a while.  She went to the [facility] clinic in the late 1990s when she had mild gait issues and was told there was no MS in her spinal cord.    She had a large exacerbation in 2009.   She had difficulty with speech and writing.   Dr. Onita started to see her.    She was placed on Rebif  and had done well x 10 years but had a lot of issues with skin reactions   She opted to switch to an oral medication.   She has been on Aubagio  x 18 months and has had hair loss including eye brows.     Stopped in 2022 and had exacerbation in 2025 (pons - diplopia facial numbness).  She started DMF .   Imaging reviewed: MRI of the brain 04/17/2024 showed multiple T2/FLAIR hyperintense foci in the cerebral hemispheres, cerebellum and pons.  There are several enhancing lesions in the cerebral hemispheres and 1  small enhancing lesion in the pons along the fourth ventricle.  MRI of the cervical spine 04/17/2024 showed no new or any enhancing lesions in the spinal cord.  Only mild degenerative changes noted.  MRI of the brain 02/23/2024 Multiple T2/FLAIR hyperintense foci in the cerebral hemispheres and a small focus in the left cerebellar hemisphere.  These are in a pattern consistent with chronic demyelinating plaque associated with multiple sclerosis.  None of the foci enhance or appear to be acute.  Compared to the MRI from 05/26/2021, there were no new lesions.     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   Mucoperiosteal thickening in the maxillary sinuses that could be seen with mild chronic maxillary sinusitis  MRI cervical spine 02/23/2024 showed T2 hyperintense foci are noted within the spinal cord  towards the left adjacent to C2, posteriorly adjacent to C3-C4: Anteriorly adjacent to C4, towards the left adjacent to C5, towards the left adjacent to C6, towards the right adjacent to T1. None of these foci enhance. The foci adjacent to C4 and C6 were not clearly seen on the MRI from 2009 while other foci while present.    MRi of the brain in 2017 was unchanged from 2009 and the MRI 07/16/2021 was unchanged from 2017.   MRI of the cervical and thoracic spine.   All of the MRIs show multiple T2/FLAIR hyperintense foci, many in the periventricular white matter.  None of the foci appear to be acute.  MRI 2009 compared to 1991 showed some progression of the MS during those 18 years  MRI of the cervical and thoracic spine from 2009 was also reviewed.  The spinal cord is normal.   MRi of the brain in 2017 was unchanged from 2009 and the MRI 07/16/2021 was unchanged from 2017.   MRI of the cervical and thoracic spine.   All of the MRIs show multiple T2/FLAIR hyperintense foci, many in the periventricular white matter.  None of the foci appear to be acute.  MRI 2009 compared to 1991 showed some progression of the MS during those 18 years  MRI of the cervical and thoracic spine from 2009 was also reviewed.  The spinal cord is normal.  REVIEW OF SYSTEMS: Constitutional: No fevers, chills, sweats, or change in appetite Eyes: No visual changes, double vision, eye pain Ear, nose and throat: No hearing loss, ear pain, nasal congestion, sore throat Cardiovascular: No chest pain, palpitations Respiratory:  No shortness of breath at rest or with exertion.   No wheezes GastrointestinaI: No nausea, vomiting, diarrhea, abdominal pain, fecal incontinence Genitourinary:  No dysuria, urinary retention or frequency.  No nocturia. Musculoskeletal:  No neck pain, back pain Integumentary: No rash, pruritus, skin lesions Neurological: as above Psychiatric: No depression at this time.  No anxiety Endocrine: No  palpitations, diaphoresis, change in appetite, change in weigh or increased thirst Hematologic/Lymphatic:  No anemia, purpura, petechiae. Allergic/Immunologic: No itchy/runny eyes, nasal congestion, recent allergic reactions, rashes  ALLERGIES: No Known Allergies  HOME MEDICATIONS:  Current Outpatient Medications:    alendronate  (FOSAMAX ) 70 MG tablet, Take 70 mg by mouth once a week. Takes on Friday, Disp: , Rfl:    Ascorbic Acid (VITAMIN C PO), Take by mouth., Disp: , Rfl:    Cholecalciferol  (VITAMIN D  PO), Take 2,000 Units by mouth daily.  Takes 4000 units on Sundays only, Disp: , Rfl:    Dimethyl Fumarate  120 MG CPDR, Take 1 capsule (120 mg total) by mouth 2 (two)  times daily. After completing this, start on 240mg  twice daily for maintenance dose, Disp: 14 capsule, Rfl: 0   Dimethyl Fumarate  240 MG CPDR, Take 1 capsule by mouth twice daily, Disp: 60 capsule, Rfl: 11   levothyroxine  (SYNTHROID ) 112 MCG tablet, Take 112 mcg by mouth daily., Disp: , Rfl:    rosuvastatin  (CRESTOR ) 5 MG tablet, Take 5 mg by mouth daily., Disp: , Rfl:   PAST MEDICAL HISTORY: Past Medical History:  Diagnosis Date   C. difficile colitis 2013   Endometrial hyperplasia without atypia, simple 09/2003   NORMAL ENDOMETRIAL BIOPSY 03/2005   Fluid retention    in ears   Hypothyroidism    Meniere disease    MS (multiple sclerosis) 02/25/2011   Osteoporosis 01/2018   T score -2.7    PAST SURGICAL HISTORY: Past Surgical History:  Procedure Laterality Date   DILATION AND CURETTAGE OF UTERUS     TAB   HYSTEROSCOPY  09/2003   POLYPS   KNEE SURGERY  2011   bilateral knee scopes   PELVIC LAPAROSCOPY     THYROIDECTOMY, PARTIAL      FAMILY HISTORY: Family History  Problem Relation Age of Onset   Diabetes Mother    Cancer Sister        Thyroid    Heart attack Sister    Breast cancer Maternal Aunt    Diabetes Paternal Grandfather    Multiple sclerosis Neg Hx     SOCIAL HISTORY:  Social History    Socioeconomic History   Marital status: Married    Spouse name: Ed   Number of children: 0   Years of education: Bachelors   Highest education level: Not on file  Occupational History   Not on file  Tobacco Use   Smoking status: Former   Smokeless tobacco: Never  Vaping Use   Vaping status: Never Used  Substance and Sexual Activity   Alcohol use: Yes    Alcohol/week: 6.0 standard drinks of alcohol    Types: 6 Glasses of wine per week    Comment: TUES THURS SAT SUN WINE   Drug use: No   Sexual activity: Not Currently    Birth control/protection: Post-menopausal    Comment: First IC >16, Partners-5, No STDs, No DES Exposure  Other Topics Concern   Not on file  Social History Narrative   Patient lives at home with husband (Ed)   Patient is right handed   Education level Bachelors degree   Caffeine consumption is 3 cups daily   Social Drivers of Corporate Investment Banker Strain: Not on file  Food Insecurity: No Food Insecurity (04/17/2024)   Hunger Vital Sign    Worried About Running Out of Food in the Last Year: Never true    Ran Out of Food in the Last Year: Never true  Transportation Needs: No Transportation Needs (04/17/2024)   PRAPARE - Administrator, Civil Service (Medical): No    Lack of Transportation (Non-Medical): No  Physical Activity: Not on file  Stress: Not on file  Social Connections: Unknown (04/18/2024)   Social Connection and Isolation Panel    Frequency of Communication with Friends and Family: More than three times a week    Frequency of Social Gatherings with Friends and Family: Twice a week    Attends Religious Services: Patient declined    Database Administrator or Organizations: Patient declined    Attends Banker Meetings: Patient declined    Marital Status: Patient  declined  Intimate Partner Violence: Not At Risk (04/17/2024)   Humiliation, Afraid, Rape, and Kick questionnaire    Fear of Current or Ex-Partner: No     Emotionally Abused: No    Physically Abused: No    Sexually Abused: No     PHYSICAL EXAM  Vitals:   08/09/24 1059  BP: 133/70  Pulse: (!) 54  Resp: 15  Height: 5' 5 (1.651 m)    Body mass index is 19.8 kg/m.   General: The patient is well-developed and well-nourished and in no acute distress  HEENT: Normocephalic and atraumatic.  Sclera are anicteric.  Skin: Extremities are without rash or  edema.  Neurologic Exam  Mental status: The patient is alert and oriented x 3 at the time of the examination. The patient has apparent normal recent and remote memory, with an apparently normal attention span and concentration ability.   Speech is normal.  Cranial nerves: Extraocular movements are full.  No ptosis.  Facial strength and sensation was normal.   No obvious hearing deficits are noted.  Motor:  Muscle bulk is normal.   Tone is normal. Strength is  5 / 5 in all 4 extremities.   Sensory: Sensory testing is intact to pinprick, soft touch and vibration sensation in all 4 extremities.  Coordination: Cerebellar testing reveals good finger-nose-finger and heel-to-shin bilaterally.  Gait and station: Station is normal.  She was able to walk in the room without a cane with a good stride.  Tandem gait is wide.  Romberg is negative.   Reflexes: Deep tendon reflexes are symmetric and normal in arms and 3 at knees with spread.  No ankle clonus.      DIAGNOSTIC DATA (LABS, IMAGING, TESTING) - I reviewed patient records, labs, notes, testing and imaging myself where available.  Lab Results  Component Value Date   WBC 8.8 04/24/2024   HGB 14.4 04/24/2024   HCT 44.6 04/24/2024   MCV 94 04/24/2024   PLT 213 04/24/2024      Component Value Date/Time   NA 140 04/24/2024 1505   K 4.4 04/24/2024 1505   CL 102 04/24/2024 1505   CO2 25 04/24/2024 1505   GLUCOSE 103 (H) 04/24/2024 1505   GLUCOSE 117 (H) 04/21/2024 1406   BUN 24 04/24/2024 1505   CREATININE 0.96 04/24/2024 1505    CREATININE 0.80 04/12/2019 0857   CALCIUM  9.4 04/24/2024 1505   PROT 6.4 04/24/2024 1505   ALBUMIN 4.2 04/24/2024 1505   AST 15 04/24/2024 1505   ALT 17 04/24/2024 1505   ALKPHOS 47 04/24/2024 1505   BILITOT 0.3 04/24/2024 1505   GFRNONAA >60 04/21/2024 1406   GFRAA 74 02/27/2020 0834   Lab Results  Component Value Date   CHOL 240 (H) 04/21/2024   HDL 110 04/21/2024   LDLCALC 112 (H) 04/21/2024   LDLDIRECT 121.7 02/25/2011   TRIG 90 04/21/2024   CHOLHDL 2.2 04/21/2024   Lab Results  Component Value Date   HGBA1C 6.1 (H) 02/27/2020   Lab Results  Component Value Date   VITAMINB12 345 02/27/2020   Lab Results  Component Value Date   TSH 1.907 04/17/2024       ASSESSMENT AND PLAN  Active secondary progressive multiple sclerosis  High risk medication use  Gait difficulty  Numbness  Diplopia  Continue DMF and check labs.   Next year, recheck MRI.  May be able to stop DMT again in 5 years or so if stable.   Stay active and exercise  as tolerated. Return in 6 mos or sooner if there are new or worsening neurologic symptoms.  This visit is part of a comprehensive longitudinal care medical relationship regarding the patients primary diagnosis of MS and related concerns.   Zakariyya Helfman A. Vear, MD, [facility] 08/09/2024, 11:12 AM Certified in Neurology, Clinical Neurophysiology, Sleep Medicine and Neuroimaging  [facility] [address] [place], [place] [address] [phone]

## 2024-08-10 ENCOUNTER — Ambulatory Visit: Payer: Self-pay | Admitting: Neurology

## 2024-08-10 LAB — CBC WITH DIFFERENTIAL/PLATELET
Basophils Absolute: 0 x10E3/uL (ref 0.0–0.2)
Basos: 1 %
EOS (ABSOLUTE): 0.1 x10E3/uL (ref 0.0–0.4)
Eos: 2 %
Hematocrit: 43.8 % (ref 34.0–46.6)
Hemoglobin: 13.9 g/dL (ref 11.1–15.9)
Immature Grans (Abs): 0 x10E3/uL (ref 0.0–0.1)
Immature Granulocytes: 0 %
Lymphocytes Absolute: 1.9 x10E3/uL (ref 0.7–3.1)
Lymphs: 27 %
MCH: 29.8 pg (ref 26.6–33.0)
MCHC: 31.7 g/dL (ref 31.5–35.7)
MCV: 94 fL (ref 79–97)
Monocytes Absolute: 0.7 x10E3/uL (ref 0.1–0.9)
Monocytes: 10 %
Neutrophils Absolute: 4.2 x10E3/uL (ref 1.4–7.0)
Neutrophils: 60 %
Platelets: 214 x10E3/uL (ref 150–450)
RBC: 4.66 x10E6/uL (ref 3.77–5.28)
RDW: 13 % (ref 11.7–15.4)
WBC: 7 x10E3/uL (ref 3.4–10.8)

## 2024-08-13 ENCOUNTER — Other Ambulatory Visit (HOSPITAL_COMMUNITY)
Admission: RE | Admit: 2024-08-13 | Discharge: 2024-08-13 | Disposition: A | Discharge status: Home / Self Care | Source: Ambulatory Visit | Attending: Nurse Practitioner | Admitting: Nurse Practitioner

## 2024-08-13 ENCOUNTER — Ambulatory Visit: Admitting: Nurse Practitioner

## 2024-08-13 ENCOUNTER — Encounter: Payer: Self-pay | Admitting: Nurse Practitioner

## 2024-08-13 VITALS — BP 110/68 | HR 68 | Ht 64.25 in | Wt 120.0 lb

## 2024-08-13 DIAGNOSIS — Z124 Encounter for screening for malignant neoplasm of cervix: Secondary | ICD-10-CM

## 2024-08-13 DIAGNOSIS — Z01419 Encounter for gynecological examination (general) (routine) without abnormal findings: Secondary | ICD-10-CM

## 2024-08-13 DIAGNOSIS — Z9189 Other specified personal risk factors, not elsewhere classified: Secondary | ICD-10-CM | POA: Diagnosis not present

## 2024-08-13 DIAGNOSIS — Z780 Asymptomatic menopausal state: Secondary | ICD-10-CM

## 2024-08-13 DIAGNOSIS — M810 Age-related osteoporosis without current pathological fracture: Secondary | ICD-10-CM | POA: Diagnosis not present

## 2024-08-13 NOTE — Progress Notes (Signed)
 Ana Russell 12-15-1955 [id number]   History:  68 y.o. G2P0020 presents for breast and pelvic exam. Postmenopausal - no HRT, no bleeding. Normal pap history. 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 She declines future bone density screenings. History of MS, hypothyroidism.   Gynecologic History No LMP recorded. Patient is postmenopausal.   Contraception: post menopausal status Sexually active: No  Health Maintenance Last Pap: 04/12/2019. Results were: Normal neg HPV Last mammogram: 07/26/2024. Results were: Normal Last colonoscopy: 2018. Neg Cologuard 06/2023 Last Dexa: 07/15/2020. Results were: T-score -2.7  Past medical history, past surgical history, family history and social history were all reviewed and documented in the EPIC chart. Married. Artist. Has 2 standard poodles ages 16 and 79.    ROS:  A ROS was performed and pertinent positives and negatives are included.  Exam:  Vitals:   08/13/24 1149  BP: 110/68  Pulse: 68  SpO2: 98%  Weight: 120 lb (54.4 kg)  Height: 5' 4.25 (1.632 m)     Body mass index is 20.44 kg/m.  General appearance:  Normal Thyroid :  Symmetrical, normal in size, without palpable masses or nodularity. Respiratory  Auscultation:  Clear without wheezing or rhonchi Cardiovascular  Auscultation:  Regular rate, without rubs, murmurs or gallops  Edema/varicosities:  Not grossly evident Abdominal  Soft,nontender, without masses, guarding or rebound.  Liver/spleen:  No organomegaly noted  Hernia:  None appreciated  Skin  Inspection:  Grossly normal Breasts: Examined lying and sitting.   Right: Without masses, retractions, nipple discharge or axillary adenopathy.   Left: Without masses, retractions, nipple discharge or axillary adenopathy. Pelvic: External genitalia:  no lesions              Urethra:  normal appearing  urethra with no masses, tenderness or lesions              Bartholins and Skenes: normal                 Vagina: normal appearing vagina with normal color and discharge, no lesions. Atrophic changes              Cervix: no lesions. Difficult to visualize due to atrophy/discomfort of exam Bimanual Exam:  Uterus:  no masses or tenderness              Adnexa: no mass, fullness, tenderness              Rectovaginal: Deferred              Anus:  normal, no lesions  Ana Russell, CMA present as chaperone.   Assessment/Plan:  68 y.o. G2P0020 for breast and pelvic exam.   Encounter for breast and pelvic examination - Education provided on SBEs, importance of preventative screenings, current guidelines, high calcium  diet, regular exercise, and multivitamin daily. Labs with PCP.   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 She declines future bone density screenings.   Postmenopausal - no HRT, no bleeding.   Cervical cancer screening - Plan: Cytology - PAP( [facility]). Normal Pap history.  Pap today per guidelines.   Screening for colon cancer - Poor prep in 2018 with colonoscopy but did not return for repeat due to not tolerating prep.  Declines colonoscopy. Negative Cologuard 06/2023.  Screening for breast cancer - Normal mammogram history.  Continue annual screenings.  Normal breast exam today.  Return in about 1 year (around 08/13/2025) for B&P (high risk).      Ana DELENA Shutter DNP, 12:18 PM 08/13/2024

## 2024-08-14 ENCOUNTER — Ambulatory Visit: Payer: Self-pay | Admitting: Nurse Practitioner

## 2024-08-14 LAB — CYTOLOGY - PAP
Comment: NEGATIVE
Diagnosis: NEGATIVE
High risk HPV: NEGATIVE

## 2024-09-19 ENCOUNTER — Ambulatory Visit

## 2024-09-19 ENCOUNTER — Ambulatory Visit (INDEPENDENT_AMBULATORY_CARE_PROVIDER_SITE_OTHER): Admitting: Podiatry

## 2024-09-19 ENCOUNTER — Encounter: Payer: Self-pay | Admitting: Podiatry

## 2024-09-19 VITALS — Ht 64.2 in | Wt 120.0 lb

## 2024-09-19 DIAGNOSIS — G35D Multiple sclerosis, unspecified: Secondary | ICD-10-CM | POA: Diagnosis not present

## 2024-09-19 DIAGNOSIS — M722 Plantar fascial fibromatosis: Secondary | ICD-10-CM | POA: Diagnosis not present

## 2024-09-19 DIAGNOSIS — M21612 Bunion of left foot: Secondary | ICD-10-CM | POA: Diagnosis not present

## 2024-09-19 DIAGNOSIS — L84 Corns and callosities: Secondary | ICD-10-CM

## 2024-09-22 NOTE — Progress Notes (Signed)
 Subjective:   Patient ID: Ana Russell, female   DOB: 69 y.o.   MRN: [id number]   HPI Patient presents just  concerned about overall foot structure issues and has a bunion that started to become painful left wanted it checked but is refusing x-rays.  Patient does have MS and does not walk with a normal gait and complaints of lesions.  She does not smoke tries to be active   Review of Systems  All other systems reviewed and are negative.       Objective:  Physical Exam Vitals and nursing note reviewed.  Constitutional:      Appearance: She is well-developed.  Pulmonary:     Effort: Pulmonary effort is normal.  Musculoskeletal:        General: Normal range of motion.  Skin:    General: Skin is warm.  Neurological:     Mental Status: She is alert.     Neurovascular status found to be intact muscle strength found to be adequate range of motion adequate patient is noted to have a small prominence on the left first metatarsal head that is only mildly tender but she was concerned about it and stated she thinks she wore too tight of a shoe.  She does have some gait instability and callus formation localized     Assessment:  Minimal structural bunion left but I think it is more due to shoe gear with corn callus formation and history of possible plantar fascial inflammation     Plan:  H&P reviewed and we discussed shoe gear I have advised her to go to shoe market for fitting she is wearing [place] now which are good but there may be a shoe that does better for her and with the MS it is difficult to control her symptoms.  Do not recommend any current treatment for the bunion if it gets worse we will x-ray and consider treatment but at this point it appears stable

## 2025-03-26 ENCOUNTER — Ambulatory Visit: Admitting: Neurology

## 2025-04-03 ENCOUNTER — Ambulatory Visit: Admitting: Neurology

## 2025-08-14 ENCOUNTER — Encounter: Admitting: Nurse Practitioner
